# Patient Record
Sex: Female | Born: 1940 | Race: Black or African American | Hispanic: No | Marital: Single | Smoking: Never smoker
Health system: Southern US, Community
[De-identification: ages and names within clinical notes are randomized; demographics above are authoritative.]

## PROBLEM LIST (undated history)

## (undated) DIAGNOSIS — I509 Heart failure, unspecified: Secondary | ICD-10-CM

## (undated) DIAGNOSIS — M1990 Unspecified osteoarthritis, unspecified site: Secondary | ICD-10-CM

## (undated) DIAGNOSIS — N289 Disorder of kidney and ureter, unspecified: Secondary | ICD-10-CM

## (undated) DIAGNOSIS — I10 Essential (primary) hypertension: Secondary | ICD-10-CM

## (undated) DIAGNOSIS — E119 Type 2 diabetes mellitus without complications: Secondary | ICD-10-CM

## (undated) HISTORY — PX: TUBAL LIGATION: SHX77

## (undated) HISTORY — PX: PACEMAKER PLACEMENT: SHX43

## (undated) HISTORY — PX: BACK SURGERY: SHX140

---

## 2019-01-02 ENCOUNTER — Inpatient Hospital Stay
Admission: EM | Admit: 2019-01-02 | Discharge: 2019-01-05 | DRG: 291 | Disposition: A | Discharge status: Home / Self Care | Payer: Medicare Other | Attending: Family Medicine | Admitting: Family Medicine

## 2019-01-02 ENCOUNTER — Emergency Department: Payer: Medicare Other

## 2019-01-02 ENCOUNTER — Other Ambulatory Visit: Payer: Self-pay

## 2019-01-02 ENCOUNTER — Encounter: Payer: Self-pay | Admitting: Emergency Medicine

## 2019-01-02 DIAGNOSIS — N189 Chronic kidney disease, unspecified: Secondary | ICD-10-CM

## 2019-01-02 DIAGNOSIS — I082 Rheumatic disorders of both aortic and tricuspid valves: Secondary | ICD-10-CM | POA: Diagnosis present

## 2019-01-02 DIAGNOSIS — D649 Anemia, unspecified: Secondary | ICD-10-CM | POA: Diagnosis not present

## 2019-01-02 DIAGNOSIS — I5033 Acute on chronic diastolic (congestive) heart failure: Secondary | ICD-10-CM | POA: Diagnosis not present

## 2019-01-02 DIAGNOSIS — I5043 Acute on chronic combined systolic (congestive) and diastolic (congestive) heart failure: Secondary | ICD-10-CM | POA: Diagnosis present

## 2019-01-02 DIAGNOSIS — Z7982 Long term (current) use of aspirin: Secondary | ICD-10-CM | POA: Diagnosis not present

## 2019-01-02 DIAGNOSIS — I509 Heart failure, unspecified: Secondary | ICD-10-CM | POA: Diagnosis not present

## 2019-01-02 DIAGNOSIS — Z794 Long term (current) use of insulin: Secondary | ICD-10-CM | POA: Diagnosis not present

## 2019-01-02 DIAGNOSIS — Z950 Presence of cardiac pacemaker: Secondary | ICD-10-CM | POA: Diagnosis not present

## 2019-01-02 DIAGNOSIS — Z79899 Other long term (current) drug therapy: Secondary | ICD-10-CM

## 2019-01-02 DIAGNOSIS — N281 Cyst of kidney, acquired: Secondary | ICD-10-CM | POA: Diagnosis present

## 2019-01-02 DIAGNOSIS — I130 Hypertensive heart and chronic kidney disease with heart failure and stage 1 through stage 4 chronic kidney disease, or unspecified chronic kidney disease: Principal | ICD-10-CM | POA: Diagnosis present

## 2019-01-02 DIAGNOSIS — I361 Nonrheumatic tricuspid (valve) insufficiency: Secondary | ICD-10-CM | POA: Diagnosis not present

## 2019-01-02 DIAGNOSIS — E8770 Fluid overload, unspecified: Secondary | ICD-10-CM | POA: Diagnosis not present

## 2019-01-02 DIAGNOSIS — Z20828 Contact with and (suspected) exposure to other viral communicable diseases: Secondary | ICD-10-CM | POA: Diagnosis present

## 2019-01-02 DIAGNOSIS — I10 Essential (primary) hypertension: Secondary | ICD-10-CM

## 2019-01-02 DIAGNOSIS — D631 Anemia in chronic kidney disease: Secondary | ICD-10-CM | POA: Diagnosis present

## 2019-01-02 DIAGNOSIS — M1990 Unspecified osteoarthritis, unspecified site: Secondary | ICD-10-CM | POA: Diagnosis present

## 2019-01-02 DIAGNOSIS — N184 Chronic kidney disease, stage 4 (severe): Secondary | ICD-10-CM | POA: Diagnosis present

## 2019-01-02 DIAGNOSIS — N19 Unspecified kidney failure: Secondary | ICD-10-CM

## 2019-01-02 DIAGNOSIS — E1122 Type 2 diabetes mellitus with diabetic chronic kidney disease: Secondary | ICD-10-CM

## 2019-01-02 DIAGNOSIS — I351 Nonrheumatic aortic (valve) insufficiency: Secondary | ICD-10-CM | POA: Diagnosis not present

## 2019-01-02 HISTORY — DX: Unspecified osteoarthritis, unspecified site: M19.90

## 2019-01-02 HISTORY — DX: Essential (primary) hypertension: I10

## 2019-01-02 HISTORY — DX: Disorder of kidney and ureter, unspecified: N28.9

## 2019-01-02 HISTORY — DX: Type 2 diabetes mellitus without complications: E11.9

## 2019-01-02 HISTORY — DX: Heart failure, unspecified: I50.9

## 2019-01-02 LAB — BASIC METABOLIC PANEL
Anion gap: 11 (ref 5–15)
BUN: 48 mg/dL — ABNORMAL HIGH (ref 8–23)
CO2: 21 mmol/L — ABNORMAL LOW (ref 22–32)
Calcium: 8.9 mg/dL (ref 8.9–10.3)
Chloride: 110 mmol/L (ref 98–111)
Creatinine, Ser: 2.68 mg/dL — ABNORMAL HIGH (ref 0.44–1.00)
GFR calc Af Amer: 19 mL/min — ABNORMAL LOW (ref >60)
GFR calc non Af Amer: 16 mL/min — ABNORMAL LOW (ref >60)
Glucose, Bld: 151 mg/dL — ABNORMAL HIGH (ref 70–99)
Potassium: 4.8 mmol/L (ref 3.5–5.1)
Sodium: 142 mmol/L (ref 135–145)

## 2019-01-02 LAB — URINALYSIS, COMPLETE (UACMP) WITH MICROSCOPIC
Bilirubin Urine: NEGATIVE
Glucose, UA: NEGATIVE mg/dL
Hgb urine dipstick: NEGATIVE
Ketones, ur: NEGATIVE mg/dL
Leukocytes,Ua: NEGATIVE
Nitrite: NEGATIVE
Protein, ur: NEGATIVE mg/dL
Specific Gravity, Urine: 1.011 (ref 1.005–1.030)
pH: 5 (ref 5.0–8.0)

## 2019-01-02 LAB — POC SARS CORONAVIRUS 2 AG: SARS Coronavirus 2 Ag: NEGATIVE

## 2019-01-02 LAB — CBC
HCT: 35.8 % — ABNORMAL LOW (ref 36.0–46.0)
Hemoglobin: 11.2 g/dL — ABNORMAL LOW (ref 12.0–15.0)
MCH: 29.4 pg (ref 26.0–34.0)
MCHC: 31.3 g/dL (ref 30.0–36.0)
MCV: 94 fL (ref 80.0–100.0)
Platelets: 235 10*3/uL (ref 150–400)
RBC: 3.81 MIL/uL — ABNORMAL LOW (ref 3.87–5.11)
RDW: 17.1 % — ABNORMAL HIGH (ref 11.5–15.5)
WBC: 5.7 10*3/uL (ref 4.0–10.5)
nRBC: 0 % (ref 0.0–0.2)

## 2019-01-02 LAB — BRAIN NATRIURETIC PEPTIDE: B Natriuretic Peptide: 1220 pg/mL — ABNORMAL HIGH (ref 0.0–100.0)

## 2019-01-02 MED ORDER — FUROSEMIDE 10 MG/ML IJ SOLN
80.0000 mg | Freq: Once | INTRAMUSCULAR | Status: AC
  Administered 2019-01-02: 80 mg via INTRAVENOUS
  Filled 2019-01-02: qty 8

## 2019-01-02 MED ORDER — SODIUM CHLORIDE 0.9% FLUSH
3.0000 mL | Freq: Two times a day (BID) | INTRAVENOUS | Status: DC
  Administered 2019-01-02 – 2019-01-05 (×6): 3 mL via INTRAVENOUS

## 2019-01-02 MED ORDER — ONDANSETRON HCL 4 MG/2ML IJ SOLN: 4.0000 mg | Freq: Four times a day (QID) | INTRAMUSCULAR | Status: DC | PRN

## 2019-01-02 MED ORDER — FUROSEMIDE 10 MG/ML IJ SOLN
40.0000 mg | Freq: Two times a day (BID) | INTRAMUSCULAR | Status: DC
  Administered 2019-01-03 – 2019-01-05 (×5): 40 mg via INTRAVENOUS
  Filled 2019-01-02 (×5): qty 4

## 2019-01-02 MED ORDER — HEPARIN SODIUM (PORCINE) 5000 UNIT/ML IJ SOLN
5000.0000 [IU] | Freq: Three times a day (TID) | INTRAMUSCULAR | Status: DC
  Administered 2019-01-02 – 2019-01-05 (×10): 5000 [IU] via SUBCUTANEOUS
  Filled 2019-01-02 (×10): qty 1

## 2019-01-02 MED ORDER — SODIUM CHLORIDE 0.9% FLUSH: 3.0000 mL | INTRAVENOUS | Status: DC | PRN

## 2019-01-02 MED ORDER — SODIUM CHLORIDE 0.9 % IV SOLN: 250.0000 mL | INTRAVENOUS | Status: DC | PRN

## 2019-01-02 MED ORDER — ACETAMINOPHEN 325 MG PO TABS: 650.0000 mg | ORAL_TABLET | ORAL | Status: DC | PRN

## 2019-01-02 NOTE — ED Notes (Signed)
Marianela Mirenda (patient daughter):  [phone]

## 2019-01-02 NOTE — ED Notes (Addendum)
This RN attempted to provide to room 238 w/o success. Will atempt again.

## 2019-01-02 NOTE — ED Triage Notes (Addendum)
Pt arrived via POV with reports of leg swelling for 2-3 weeks. Pt states she is visiting from [place] and son wanted her to get her checked out.  Pt denies any shortness of breath, but states she feels swollen all over.  Pitting edema noted on exam, pt reports hx of HF.

## 2019-01-02 NOTE — H&P (Addendum)
History and Physical    Michele Beck [id number] DOB: 09/29/1940 DOA: 01/02/2019  PCP: No primary care provider on file.  Patient coming from: home, visiting son from [place]   Chief Complaint: "chf", LE edema  HPI: Michele Beck is a 78 y.o. female with medical history significant of diabetes, chf, htn, CKD (baseline unknown) was brought in by son for evaluation of her lower extremity edema.  Patient reports she is in little bit of "CHF ".  She does not weigh herself daily to know if her weight has gone up.  She does report she has some shortness of breath with exertion but is not too bad.  Also reports her lower extremity edema is worse than baseline.  Denies chest pain.  Unable to tell me if she has orthopnea as she does not lay flat in bed as instructed per her cardiologist per patient.  Denies any fever, chills, dizziness, or any other symptoms.  She reports having kidney disease and she reports following a urologist but unable to tell when she follows a nephrologist.  She eats some salty food /canned food at times but tries to watch her intake. Report compliance with meds  ED Course: In the ED Covid testing is not collected.  And pending.  Her BNP was 1220 and found with creatinine of 2.68.  This x-ray was low volumes with cardiomegaly and bibasilar patchy atelectasis or infiltrate.  Review of Systems: All systems reviewed and otherwise negative.    Past Medical History:  Diagnosis Date  . Arthritis   . CHF (congestive heart failure) ([place])   . Diabetes mellitus without complication ([place])   . Hypertension   . Renal disorder     Past Surgical History:  Procedure Laterality Date  . BACK SURGERY    . PACEMAKER PLACEMENT    . TUBAL LIGATION       reports that she has never smoked. She has never used smokeless tobacco. No history on file for alcohol and drug.  No Known Allergies  History reviewed. No pertinent family history.   Prior to Admission medications   Medication  Sig Start Date End Date Taking? Authorizing Provider  allopurinol (ZYLOPRIM) 100 MG tablet Take 100 mg by mouth daily. 12/07/18   [provider]  amLODipine (NORVASC) 5 MG tablet Take 5 mg by mouth daily. 11/02/18   [provider]  atorvastatin (LIPITOR) 20 MG tablet Take 20 mg by mouth daily. 12/27/18   [provider]  ferrous sulfate 325 (65 FE) MG tablet Take 325 mg by mouth 2 (two) times daily. 10/04/18   [provider]  furosemide (LASIX) 40 MG tablet Take 40 mg by mouth daily. 12/20/18   [provider]  hydrALAZINE (APRESOLINE) 100 MG tablet Take 100 mg by mouth 3 (three) times daily. 10/04/18   [provider]  LEVEMIR FLEXTOUCH 100 UNIT/ML Pen SMARTSIG:6 Unit(s) SUB-Q Every Night 07/20/18   [provider]  lisinopril (ZESTRIL) 40 MG tablet Take 40 mg by mouth 2 (two) times daily. 12/03/18   [provider]  metoprolol tartrate (LOPRESSOR) 100 MG tablet Take 100 mg by mouth 2 (two) times daily. 12/27/18   [provider]    Physical Exam: Vitals:   01/02/19 1136  BP: (!) 129/43  Pulse: 71  Resp: 18  Temp: 98.3 F (36.8 C)  TempSrc: Oral  SpO2: 97%  Weight: 90.7 kg  Height: 5\' 6"  (1.676 m)    Constitutional: NAD, calm, comfortable Vitals:   01/02/19 1136  BP: (!) 129/43  Pulse: 71  Resp: 18  Temp: 98.3 F (36.8 C)  TempSrc: Oral  SpO2: 97%  Weight: 90.7 kg  Height: 5\' 6"  (1.676 m)   Eyes: PERRL, lids and conjunctivae normal ENMT: mask on  Neck: normal, supple Respiratory:minimal scattered crackles R>L, no wheezing or rhonchi Cardiovascular: Regular rate and rhythm, no murmurs / rubs / gallops.  Abdomen: +fluid on palpation. NT. Mildly distended. +bs, no rebound  Musculoskeletal: no clubbing / cyanosis.  LE : 2+edema b/l with chronic skin changes. Skin: Dry warm Neurologic: CN 2-12 grossly intact.  Awake and alert x3 good strength x4 psychiatric: Normal judgment and insight. Alert  and oriented x 3. Normal mood.    Labs on Admission: I have personally reviewed following labs and imaging studies  CBC: Recent Labs  Lab 01/02/19 1208  WBC 5.7  HGB 11.2*  HCT 35.8*  MCV 94.0  PLT [id number]   Basic Metabolic Panel: Recent Labs  Lab 01/02/19 1208  NA 142  K 4.8  CL 110  CO2 21*  GLUCOSE 151*  BUN 48*  CREATININE 2.68*  CALCIUM 8.9   GFR: Estimated Creatinine Clearance: 19.6 mL/min (A) (by C-G formula based on SCr of 2.68 mg/dL (H)). Liver Function Tests: No results for input(s): AST, ALT, ALKPHOS, BILITOT, PROT, ALBUMIN in the last 168 hours. No results for input(s): LIPASE, AMYLASE in the last 168 hours. No results for input(s): AMMONIA in the last 168 hours. Coagulation Profile: No results for input(s): INR, PROTIME in the last 168 hours. Cardiac Enzymes: No results for input(s): CKTOTAL, CKMB, CKMBINDEX, TROPONINI in the last 168 hours. BNP (last 3 results) No results for input(s): PROBNP in the last 8760 hours. HbA1C: No results for input(s): HGBA1C in the last 72 hours. CBG: No results for input(s): GLUCAP in the last 168 hours. Lipid Profile: No results for input(s): CHOL, HDL, LDLCALC, TRIG, CHOLHDL, LDLDIRECT in the last 72 hours. Thyroid Function Tests: No results for input(s): TSH, T4TOTAL, FREET4, T3FREE, THYROIDAB in the last 72 hours. Anemia Panel: No results for input(s): VITAMINB12, FOLATE, FERRITIN, TIBC, IRON, RETICCTPCT in the last 72 hours. Urine analysis: No results found for: COLORURINE, APPEARANCEUR, LABSPEC, [place], GLUCOSEU, [place], BILIRUBINUR, KETONESUR, PROTEINUR, UROBILINOGEN, NITRITE, LEUKOCYTESUR  Radiological Exams on Admission: DG Chest 2 View  Result Date: 01/02/2019 CLINICAL DATA:  2-3 week history of shortness of breath and dry cough. EXAM: CHEST - 2 VIEW COMPARISON:  None. FINDINGS: 1233 hours. The cardio pericardial silhouette is enlarged. There is pulmonary vascular congestion without overt pulmonary edema.  Patchy airspace disease noted at the bases compatible with atelectasis or infiltrate. No substantial pleural effusion. Single lead pacer/AICD noted. The visualized bony structures of the thorax are intact. IMPRESSION: Low volumes with cardiomegaly and bibasilar patchy atelectasis or infiltrate. Electronically Signed   By: Misty Stanley M.D.   On: 01/02/2019 13:05    EKG: Independently reviewed. NSR, no ischemic st/t changes  Assessment/Plan Active Problems:   Acute CHF (congestive heart failure) ([place])    1. Acute chf/volume overload-systolic v.s. diastolic.Also with CKD may be retaining . BNP elevated Given Lasix 80 IV x1 in the ED Will start Lasix 40 mg IV twice daily Strict I's and O's Daily weight Echo to evaluate for systolic and diastolic function  2.CKD- unsure of baseline. Not sure if has acute playing a part Ck renal US Monitor diuretics Avoid nephrotoxic medications and NSAIDs Check urinalysis We will hold off on nephrology consult for now  3. Anemia- possibly chronic  from CKD. Baseline unknown No reports of bleed Monitor H&H for now  4. DM-ck HA1c Ck fs RISS Need med rec reconciliation to be done by phamacy, already discussed with them     DVT prophylaxis: Heparin subcu Code Status: Full Family Communication: Discussed assessment and plan with son via phone Disposition Plan: Likely will be here 2 midnight days until medically more stable and euvolemic.WILL PLACE ON AIRBORN PRECAUTION UNTIL COVID TEST RETURNS Consults called: None Admission status: Inpatient as he requires 2 midnight stays.    Nolberto Hanlon MD [facility] Pager [phone]   If 7PM-7AM, please contact night-coverage [email or web address] Password [facility]  01/02/2019, 4:51 PM

## 2019-01-02 NOTE — ED Provider Notes (Signed)
[facility] Emergency Department Provider Note ____________________________________________  Time seen: 1515  I have reviewed the triage vital signs and the nursing notes.  HISTORY  Chief Complaint  Leg Swelling   HPI Michele Beck is a 78 y.o. female presents to the ED from home, for evaluation of  a 2 to 3-week complaint of leg swelling.  Patient with a history of CHF as well as renal insufficiency, presents to the ED while on vacation visiting from [place].  Patient would describe that over the last 4 to 5 weeks she has had slowly increasing heaviness in the legs.  She denies any significant chest pain, shortness of breath, or fevers.  She feels like the swelling has come from her lower legs and is now coming into her thighs.  She was recently evaluated by her primary provider who had mentioned her kidney disease, and was in the process of increasing her diuretic.  Patient presents now for evaluation of bilateral leg swelling and heaviness.  Past Medical History:  Diagnosis Date  . Arthritis   . CHF (congestive heart failure) ([place])   . Diabetes mellitus without complication ([place])   . Hypertension   . Renal disorder     Patient Active Problem List   Diagnosis Date Noted  . Acute CHF (congestive heart failure) ([place]) 01/02/2019    Past Surgical History:  Procedure Laterality Date  . BACK SURGERY    . PACEMAKER PLACEMENT    . TUBAL LIGATION      Prior to Admission medications   Medication Sig Start Date End Date Taking? Authorizing Provider  allopurinol (ZYLOPRIM) 100 MG tablet Take 100 mg by mouth daily. 12/07/18  Yes [provider]  amLODipine (NORVASC) 5 MG tablet Take 5 mg by mouth daily. 11/02/18  Yes [provider]  aspirin 81 MG chewable tablet Chew 81 mg by mouth daily.   Yes [provider]  atorvastatin (LIPITOR) 20 MG tablet Take 20 mg by mouth daily. 12/27/18  Yes [provider]  ferrous sulfate 325  (65 FE) MG tablet Take 325 mg by mouth 2 (two) times daily. 10/04/18  Yes [provider]  furosemide (LASIX) 40 MG tablet Take 40 mg by mouth daily. 12/20/18  Yes [provider]  hydrALAZINE (APRESOLINE) 100 MG tablet Take 100 mg by mouth 3 (three) times daily. 10/04/18  Yes [provider]  LEVEMIR FLEXTOUCH 100 UNIT/ML Pen SMARTSIG:6 Unit(s) SUB-Q Every Night 07/20/18  Yes [provider]  lisinopril (ZESTRIL) 40 MG tablet Take 40 mg by mouth 2 (two) times daily. 12/03/18  Yes [provider]  metoprolol tartrate (LOPRESSOR) 100 MG tablet Take 100 mg by mouth 2 (two) times daily. 12/27/18  Yes [provider]    Allergies Patient has no known allergies.  History reviewed. No pertinent family history.  Social History Social History   Tobacco Use  . Smoking status: Never Smoker  . Smokeless tobacco: Never Used  Substance Use Topics  . Alcohol use: Not on file  . Drug use: Not on file    Review of Systems  Constitutional: Negative for fever. Eyes: Negative for visual changes. ENT: Negative for sore throat. Cardiovascular: Negative for chest pain.  Reports bilateral edema as above. Respiratory: Negative for shortness of breath. Gastrointestinal: Negative for abdominal pain, vomiting and diarrhea. Genitourinary: Negative for dysuria. Musculoskeletal: Negative for back pain. Skin: Negative for rash. Neurological: Negative for headaches, focal weakness or numbness. ____________________________________________  PHYSICAL EXAM:  VITAL SIGNS: ED Triage  Vitals [01/02/19 1136]  Enc Vitals Group     BP (!) 129/43     Pulse Rate 71     Resp 18     Temp 98.3 F (36.8 C)     Temp Source Oral     SpO2 97 %     Weight 200 lb (90.7 kg)     Height 5\' 6"  (1.676 m)     Head Circumference      Peak Flow      Pain Score 0     Pain Loc      Pain Edu?      Excl. in [place]?     Constitutional: Alert and oriented. Well appearing and in  no distress. Head: Normocephalic and atraumatic. Eyes: Conjunctivae are normal. Normal extraocular movements Cardiovascular: Normal rate, regular rhythm. Normal distal pulses. Bilateral edema and 2+ pitting noted.  Respiratory: Normal respiratory effort. No wheezes/rhonchi. Mild rales noted bilaterally Gastrointestinal: Soft and nontender. No distention. Musculoskeletal: Nontender with normal range of motion in all extremities.  Neurologic:  Normal gait without ataxia. Normal speech and language. No gross focal neurologic deficits are appreciated. Skin:  Skin is warm, dry and intact. No rash noted. BLE edema as above. No skin lesions, weeping, or ecchymosis  ____________________________________________   LABS (pertinent positives/negatives)  Labs Reviewed  BASIC METABOLIC PANEL - Abnormal; Notable for the following components:      Result Value   CO2 21 (*)    Glucose, Bld 151 (*)    BUN 48 (*)    Creatinine, Ser 2.68 (*)    GFR calc non Af Amer 16 (*)    GFR calc Af Amer 19 (*)    All other components within normal limits  CBC - Abnormal; Notable for the following components:   RBC 3.81 (*)    Hemoglobin 11.2 (*)    HCT 35.8 (*)    RDW 17.1 (*)    All other components within normal limits  BRAIN NATRIURETIC PEPTIDE - Abnormal; Notable for the following components:   B Natriuretic Peptide 1,220.0 (*)    All other components within normal limits  URINALYSIS, COMPLETE (UACMP) WITH MICROSCOPIC - Abnormal; Notable for the following components:   Color, Urine STRAW (*)    APPearance CLEAR (*)    Bacteria, UA MANY (*)    All other components within normal limits  CBC  CREATININE, SERUM  BASIC METABOLIC PANEL  POC SARS CORONAVIRUS 2 AG -  ED  ____________________________________________  EKG  NSR 69 bpm PR Interval 154 ms QRS duration 86 ms Normal axis No STEMI ____________________________________________   RADIOLOGY  CXR IMPRESSION: Low volumes with cardiomegaly and  bibasilar patchy atelectasis or Infiltrate.  Doppler US BLE IMPRESSION: No evidence of deep venous thrombosis in either lower extremity.  Bilateral lower extremity subcutaneous edema.  Bilateral groin adenopathy.  Renal US IMPRESSION: Cortical thinning and medical renal disease changes of both kidneys.  Small LEFT renal cysts.  No additional renal masses or hydronephrosis. ____________________________________________  PROCEDURES  Lasix 80 mg IVP Procedures ____________________________________________  INITIAL IMPRESSION / ASSESSMENT AND PLAN / ED COURSE  Patient with ED evaluation of 4-5 weeks of increasing LE edema. She presents to the ED from [place], while visiting her son. She is found to be in a acute CHF exacerbation with BNP elevated at 1220 and chronic kidney disease with BUN/Cr 48/2.68. She endorses history of CKD, but we have no prior records to compare to baseline. I have recommended and discussed admission for  diuresis, and patient is agreeable. I will discuss admission with hospitalist.   ----------------------------------------- 4:03 PM on 01/02/2019 ----------------------------------------- Page to hospitalist.  Spoke with Dr. Kurtis Bushman: She request ultrasound studies of the lower extremities related to possible DVT rule out due to patient travel status.  Those results along with the rapid Covid test are pending at this time.  Patient will be admitted to the hospital service for further management.  Jeanita Brignola was evaluated in Emergency Department on 01/02/2019 for the symptoms described in the history of present illness. She was evaluated in the context of the global COVID-19 pandemic, which necessitated consideration that the patient might be at risk for infection with the SARS-CoV-2 virus that causes COVID-19. Institutional protocols and algorithms that pertain to the evaluation of patients at risk for COVID-19 are in a state of rapid change based on  information released by regulatory bodies including the CDC and federal and state organizations. These policies and algorithms were followed during the patient's care in the ED. ____________________________________________  FINAL CLINICAL IMPRESSION(S) / ED DIAGNOSES  Final diagnoses:  Acute on chronic congestive heart failure, unspecified heart failure type ([place])  Chronic renal impairment, unspecified CKD stage      Melvenia Needles, PA-C 01/02/19 1931    Nance Pear, MD 01/02/19 2011

## 2019-01-02 NOTE — Plan of Care (Signed)
  Problem: Education: Goal: Knowledge of General Education information will improve Description: Including pain rating scale, medication(s)/side effects and non-pharmacologic comfort measures Outcome: Progressing   Problem: Clinical Measurements: Goal: Cardiovascular complication will be avoided Outcome: Progressing   Problem: Safety: Goal: Ability to remain free from injury will improve Outcome: Progressing   Problem: Skin Integrity: Goal: Risk for impaired skin integrity will decrease Outcome: Progressing   Problem: Education: Goal: Ability to demonstrate management of disease process will improve Outcome: Progressing

## 2019-01-02 NOTE — ED Notes (Signed)
Pure[place] in place at this time.

## 2019-01-02 NOTE — ED Notes (Signed)
Patient transported to Ultrasound 

## 2019-01-02 NOTE — ED Notes (Signed)
Pt back from US

## 2019-01-03 ENCOUNTER — Inpatient Hospital Stay (HOSPITAL_COMMUNITY)
Admit: 2019-01-03 | Discharge: 2019-01-03 | Disposition: A | Discharge status: Home / Self Care | Payer: Medicare Other | Attending: Internal Medicine | Admitting: Internal Medicine

## 2019-01-03 DIAGNOSIS — E1122 Type 2 diabetes mellitus with diabetic chronic kidney disease: Secondary | ICD-10-CM

## 2019-01-03 DIAGNOSIS — N189 Chronic kidney disease, unspecified: Secondary | ICD-10-CM

## 2019-01-03 DIAGNOSIS — I351 Nonrheumatic aortic (valve) insufficiency: Secondary | ICD-10-CM

## 2019-01-03 DIAGNOSIS — I361 Nonrheumatic tricuspid (valve) insufficiency: Secondary | ICD-10-CM

## 2019-01-03 LAB — BASIC METABOLIC PANEL
Anion gap: 10 (ref 5–15)
BUN: 42 mg/dL — ABNORMAL HIGH (ref 8–23)
CO2: 26 mmol/L (ref 22–32)
Calcium: 9.1 mg/dL (ref 8.9–10.3)
Chloride: 108 mmol/L (ref 98–111)
Creatinine, Ser: 2.21 mg/dL — ABNORMAL HIGH (ref 0.44–1.00)
GFR calc Af Amer: 24 mL/min — ABNORMAL LOW (ref >60)
GFR calc non Af Amer: 21 mL/min — ABNORMAL LOW (ref >60)
Glucose, Bld: 134 mg/dL — ABNORMAL HIGH (ref 70–99)
Potassium: 4.7 mmol/L (ref 3.5–5.1)
Sodium: 144 mmol/L (ref 135–145)

## 2019-01-03 LAB — SARS CORONAVIRUS 2 (TAT 6-24 HRS): SARS Coronavirus 2: NEGATIVE

## 2019-01-03 LAB — GLUCOSE, CAPILLARY
Glucose-Capillary: 137 mg/dL — ABNORMAL HIGH (ref 70–99)
Glucose-Capillary: 115 mg/dL — ABNORMAL HIGH (ref 70–99)
Glucose-Capillary: 163 mg/dL — ABNORMAL HIGH (ref 70–99)
Glucose-Capillary: 150 mg/dL — ABNORMAL HIGH (ref 70–99)

## 2019-01-03 LAB — HEMOGLOBIN A1C
Hgb A1c MFr Bld: 6.4 % — ABNORMAL HIGH (ref 4.8–5.6)
Mean Plasma Glucose: 136.98 mg/dL

## 2019-01-03 LAB — ECHOCARDIOGRAM COMPLETE
Height: 66 in
Weight: 3790.4 oz

## 2019-01-03 MED ORDER — INSULIN ASPART 100 UNIT/ML ~~LOC~~ SOLN
0.0000 [IU] | Freq: Three times a day (TID) | SUBCUTANEOUS | Status: DC
  Administered 2019-01-03 – 2019-01-05 (×5): 2 [IU] via SUBCUTANEOUS
  Filled 2019-01-03 (×5): qty 1

## 2019-01-03 MED ORDER — TRAZODONE HCL 50 MG PO TABS
50.0000 mg | ORAL_TABLET | Freq: Every evening | ORAL | Status: DC | PRN
  Administered 2019-01-04: 50 mg via ORAL
  Filled 2019-01-03: qty 1

## 2019-01-03 MED ORDER — AMLODIPINE BESYLATE 5 MG PO TABS
5.0000 mg | ORAL_TABLET | Freq: Every day | ORAL | Status: DC
  Administered 2019-01-03 – 2019-01-05 (×3): 5 mg via ORAL
  Filled 2019-01-03 (×3): qty 1

## 2019-01-03 MED ORDER — HYDRALAZINE HCL 25 MG PO TABS
25.0000 mg | ORAL_TABLET | Freq: Three times a day (TID) | ORAL | Status: DC
  Administered 2019-01-03 – 2019-01-04 (×3): 25 mg via ORAL
  Filled 2019-01-03 (×3): qty 1

## 2019-01-03 MED ORDER — ATORVASTATIN CALCIUM 20 MG PO TABS
20.0000 mg | ORAL_TABLET | Freq: Every day | ORAL | Status: DC
  Administered 2019-01-03 – 2019-01-04 (×2): 20 mg via ORAL
  Filled 2019-01-03 (×2): qty 1

## 2019-01-03 NOTE — Progress Notes (Signed)
*  PRELIMINARY RESULTS* Echocardiogram 2D Echocardiogram has been performed.  Michele Beck 01/03/2019, 9:40 AM

## 2019-01-03 NOTE — Progress Notes (Signed)
PROGRESS NOTE    Michele Beck  [id number] DOB: 04-30-1940 DOA: 01/02/2019 PCP: System, Pcp Not In    Brief Narrative:  Michele Beck is a 78 y.o. female with medical history significant of diabetes, chf, htn, CKD (baseline unknown) was brought in by son for evaluation of her lower extremity edema. Started on chf treatment. covid pcr pending    Consultants:   none  Procedures: Echo  Antimicrobials:   None   Subjective: Feeling much better today.  Less short of breath.  No chest pain.  Less orthopneic  Objective: Vitals:   01/02/19 2203 01/02/19 2237 01/03/19 0440 01/03/19 0828  BP: (!) 172/65  (!) 148/67 (!) 147/56  Pulse: 73  72 72  Resp: 18  18 19   Temp: 97.7 F (36.5 C)  97.6 F (36.4 C) 97.7 F (36.5 C)  TempSrc: Oral  Oral Oral  SpO2: 94%  93% 94%  Weight:  107.6 kg 107.5 kg   Height:  5\' 6"  (1.676 m)      Intake/Output Summary (Last 24 hours) at 01/03/2019 1407 Last data filed at 01/03/2019 1219 Gross per 24 hour  Intake --  Output 3000 ml  Net -3000 ml   Filed Weights   01/02/19 1136 01/02/19 2237 01/03/19 0440  Weight: 90.7 kg 107.6 kg 107.5 kg    Examination:  General exam: Appears calm and comfortable  Respiratory system: Minimal crackles at bases no wheeze rales rhonchi's Cardiovascular system: S1 & S2 heard, RRR.3/6 SEM LSB Gastrointestinal system: Abdomen is less distended, soft and nontender. Normal bowel sounds heard. Central nervous system: Alert and orientedx3. No focal neurological deficits. Extremities: +edema b/l improving Psychiatry: Judgement and insight appear normal. Mood & affect appropriate.     Data Reviewed: I have personally reviewed following labs and imaging studies  CBC: Recent Labs  Lab 01/02/19 1208  WBC 5.7  HGB 11.2*  HCT 35.8*  MCV 94.0  PLT [id number]   Basic Metabolic Panel: Recent Labs  Lab 01/02/19 1208 01/03/19 0554  NA 142 144  K 4.8 4.7  CL 110 108  CO2 21* 26  GLUCOSE 151* 134*  BUN  48* 42*  CREATININE 2.68* 2.21*  CALCIUM 8.9 9.1   GFR: Estimated Creatinine Clearance: 26 mL/min (A) (by C-G formula based on SCr of 2.21 mg/dL (H)). Liver Function Tests: No results for input(s): AST, ALT, ALKPHOS, BILITOT, PROT, ALBUMIN in the last 168 hours. No results for input(s): LIPASE, AMYLASE in the last 168 hours. No results for input(s): AMMONIA in the last 168 hours. Coagulation Profile: No results for input(s): INR, PROTIME in the last 168 hours. Cardiac Enzymes: No results for input(s): CKTOTAL, CKMB, CKMBINDEX, TROPONINI in the last 168 hours. BNP (last 3 results) No results for input(s): PROBNP in the last 8760 hours. HbA1C: No results for input(s): HGBA1C in the last 72 hours. CBG: Recent Labs  Lab 01/03/19 1145  GLUCAP 115*   Lipid Profile: No results for input(s): CHOL, HDL, LDLCALC, TRIG, CHOLHDL, LDLDIRECT in the last 72 hours. Thyroid Function Tests: No results for input(s): TSH, T4TOTAL, FREET4, T3FREE, THYROIDAB in the last 72 hours. Anemia Panel: No results for input(s): VITAMINB12, FOLATE, FERRITIN, TIBC, IRON, RETICCTPCT in the last 72 hours. Sepsis Labs: No results for input(s): PROCALCITON, LATICACIDVEN in the last 168 hours.  No results found for this or any previous visit (from the past 240 hour(s)).       Radiology Studies: DG Chest 2 View  Result Date: 01/02/2019 CLINICAL DATA:  2-3 week  history of shortness of breath and dry cough. EXAM: CHEST - 2 VIEW COMPARISON:  None. FINDINGS: 1233 hours. The cardio pericardial silhouette is enlarged. There is pulmonary vascular congestion without overt pulmonary edema. Patchy airspace disease noted at the bases compatible with atelectasis or infiltrate. No substantial pleural effusion. Single lead pacer/AICD noted. The visualized bony structures of the thorax are intact. IMPRESSION: Low volumes with cardiomegaly and bibasilar patchy atelectasis or infiltrate. Electronically Signed   By: Misty Stanley  M.D.   On: 01/02/2019 13:05   US RENAL  Result Date: 01/02/2019 CLINICAL DATA:  Renal failure, history hypertension, diabetes mellitus, CHF EXAM: RENAL / URINARY TRACT ULTRASOUND COMPLETE COMPARISON:  None FINDINGS: Right Kidney: Renal measurements: 8.1 x 4.4 x 4.2 cm = volume: 81 mL. Cortical thinning. Increased cortical echogenicity. No mass, hydronephrosis or shadowing calcification. Left Kidney: Renal measurements: 8.4 x 4.8 x 4.6 cm = volume: 99 mL. Cortical thinning. Increased cortical echogenicity. Small cyst at upper pole 1.3 x 1.3 x 1.6 cm. Additional cyst at inferior pole 2.6 x 1.7 x 2.2 cm. No additional mass, hydronephrosis, or shadowing calcification. Bladder: Appears normal for degree of bladder distention. Ureteral jets were not visualized. Other: N/A IMPRESSION: Cortical thinning and medical renal disease changes of both kidneys. Small LEFT renal cysts. No additional renal masses or hydronephrosis. Electronically Signed   By: Lavonia Dana M.D.   On: 01/02/2019 19:01   US Venous Img Lower Bilateral  Result Date: 01/02/2019 CLINICAL DATA:  Bilateral lower extremity edema and swelling EXAM: BILATERAL LOWER EXTREMITY VENOUS DOPPLER ULTRASOUND TECHNIQUE: Gray-scale sonography with graded compression, as well as color Doppler and duplex ultrasound were performed to evaluate the lower extremity deep venous systems from the level of the common femoral vein and including the common femoral, femoral, profunda femoral, popliteal and calf veins including the posterior tibial, peroneal and gastrocnemius veins when visible. The superficial great saphenous vein was also interrogated. Spectral Doppler was utilized to evaluate flow at rest and with distal augmentation maneuvers in the common femoral, femoral and popliteal veins. COMPARISON:  None. FINDINGS: RIGHT LOWER EXTREMITY Common Femoral Vein: No evidence of thrombus. Normal compressibility, respiratory phasicity and response to augmentation.  Saphenofemoral Junction: No evidence of thrombus. Normal compressibility and flow on color Doppler imaging. Profunda Femoral Vein: No evidence of thrombus. Normal compressibility and flow on color Doppler imaging. Femoral Vein: No evidence of thrombus. Normal compressibility, respiratory phasicity and response to augmentation. Popliteal Vein: No evidence of thrombus. Normal compressibility, respiratory phasicity and response to augmentation. Calf Veins: Somewhat limited visualization. Superficial Great Saphenous Vein: No evidence of thrombus. Normal compressibility. Venous Reflux:  None. Other Findings:  None. LEFT LOWER EXTREMITY Common Femoral Vein: No evidence of thrombus. Normal compressibility, respiratory phasicity and response to augmentation. Saphenofemoral Junction: No evidence of thrombus. Normal compressibility and flow on color Doppler imaging. Profunda Femoral Vein: No evidence of thrombus. Normal compressibility and flow on color Doppler imaging. Femoral Vein: No evidence of thrombus. Normal compressibility, respiratory phasicity and response to augmentation. Popliteal Vein: No evidence of thrombus. Normal compressibility, respiratory phasicity and response to augmentation. Calf Veins: Somewhat limited visualization. Superficial Great Saphenous Vein: No evidence of thrombus. Normal compressibility. Venous Reflux:  None. Other Findings: Somewhat limited visualization of the calf vessels due to overlying subcutaneous edema. Bilateral inguinal adenopathy is seen. IMPRESSION: No evidence of deep venous thrombosis in either lower extremity. Bilateral lower extremity subcutaneous edema. Bilateral groin adenopathy. Electronically Signed   By: Prudencio Pair M.D.   On: 01/02/2019  19:09        Scheduled Meds: . furosemide  40 mg Intravenous BID  . heparin  5,000 Units Subcutaneous Q8H  . sodium chloride flush  3 mL Intravenous Q12H   Continuous Infusions: . sodium chloride      Assessment &  Plan:   Active Problems:   Acute CHF (congestive heart failure) ([place])  1. Acute chf/volume overload-systolic v.s. diastolic.Also with CKD may be retaining . BNP elevated Given Lasix 80 IV x1 in the ED Continue on Lasix 40 mg IV twice daily Strict I's and O's Daily weight Echo to evaluate for systolic and diastolic function-pending, will f/u  2.A/CKD- unsure of baseline.Improving with diuresis. renal US-Cortical thinning and medical renal disease changes of both kidneys., LT small renal cyst Avoid nephrotoxic medications and NSAIDs No proteinuria urinalysis We will hold off on nephrology consult for now  3. Anemia- possibly chronic from CKD. Baseline unknown No reports of bleed Monitor H&H for now  4. DM-ck HA1c Ck fs RISS Need med rec reconciliation to be done by phamacy, already discussed with them     DVT prophylaxis: Heparin subcu Code Status: Full Family Communication:  None at bedside Disposition Plan: Possible DC in 1 to 2 days when medically more stable from heart failure standpoint.   LOS: 1 day   Time spent: 5 minutes with more than 50% COC    Nolberto Hanlon, MD [facility] Pager [phone]  If 7PM-7AM, please contact night-coverage [email or web address] Password [email or web address] 01/03/2019, 2:07 PM

## 2019-01-03 NOTE — Progress Notes (Signed)
Unable to do pts. ReDs reading due to BMI.

## 2019-01-04 DIAGNOSIS — I10 Essential (primary) hypertension: Secondary | ICD-10-CM

## 2019-01-04 LAB — GLUCOSE, CAPILLARY
Glucose-Capillary: 197 mg/dL — ABNORMAL HIGH (ref 70–99)
Glucose-Capillary: 175 mg/dL — ABNORMAL HIGH (ref 70–99)
Glucose-Capillary: 157 mg/dL — ABNORMAL HIGH (ref 70–99)
Glucose-Capillary: 204 mg/dL — ABNORMAL HIGH (ref 70–99)

## 2019-01-04 LAB — BASIC METABOLIC PANEL
Anion gap: 14 (ref 5–15)
BUN: 38 mg/dL — ABNORMAL HIGH (ref 8–23)
CO2: 26 mmol/L (ref 22–32)
Calcium: 9.5 mg/dL (ref 8.9–10.3)
Chloride: 104 mmol/L (ref 98–111)
Creatinine, Ser: 2.13 mg/dL — ABNORMAL HIGH (ref 0.44–1.00)
GFR calc Af Amer: 25 mL/min — ABNORMAL LOW (ref >60)
GFR calc non Af Amer: 22 mL/min — ABNORMAL LOW (ref >60)
Glucose, Bld: 231 mg/dL — ABNORMAL HIGH (ref 70–99)
Potassium: 4.4 mmol/L (ref 3.5–5.1)
Sodium: 144 mmol/L (ref 135–145)

## 2019-01-04 LAB — BRAIN NATRIURETIC PEPTIDE: B Natriuretic Peptide: 1196 pg/mL — ABNORMAL HIGH (ref 0.0–100.0)

## 2019-01-04 MED ORDER — HYDRALAZINE HCL 50 MG PO TABS
100.0000 mg | ORAL_TABLET | Freq: Three times a day (TID) | ORAL | Status: DC
  Administered 2019-01-04 – 2019-01-05 (×5): 100 mg via ORAL
  Filled 2019-01-04 (×5): qty 2

## 2019-01-04 MED ORDER — METOPROLOL TARTRATE 50 MG PO TABS
50.0000 mg | ORAL_TABLET | Freq: Two times a day (BID) | ORAL | Status: DC
  Administered 2019-01-04 – 2019-01-05 (×2): 50 mg via ORAL
  Filled 2019-01-04 (×2): qty 1

## 2019-01-04 NOTE — Progress Notes (Signed)
PROGRESS NOTE    Michele Beck  [id number] DOB: Jun 07, 1940 DOA: 01/02/2019 PCP: System, Pcp Not In    Brief Narrative:  Michele Beck is a 78 y.o. female with medical history significant of diabetes, chf, htn, CKD (baseline unknown) was brought in by son for evaluation of her lower extremity edema. Started on chf treatment. covid pcr negative   Consultants:   none  Procedures: Echo  Antimicrobials:   None   Subjective: Feeling much better today, still not quite at baseline.  Feels less swollen  Objective: Vitals:   01/04/19 0348 01/04/19 0751 01/04/19 1528 01/04/19 1528  BP: (!) 154/80 (!) 155/66 (!) 147/69 (!) 147/69  Pulse: 98 93  94  Resp: 20 20  18   Temp: (!) 97.5 F (36.4 C) 97.7 F (36.5 C)  (!) 97.4 F (36.3 C)  TempSrc: Oral Oral  Oral  SpO2: (!) 88% 90%  90%  Weight: 103 kg     Height:        Intake/Output Summary (Last 24 hours) at 01/04/2019 1827 Last data filed at 01/04/2019 1816 Gross per 24 hour  Intake 3 ml  Output 850 ml  Net -847 ml   Filed Weights   01/02/19 2237 01/03/19 0440 01/04/19 0348  Weight: 107.6 kg 107.5 kg 103 kg    Examination:  General exam: Appears calm and comfortable , NAD looks much better nontachypneic Respiratory system: Minimal crackles at bases no wheeze, rhonchi's Cardiovascular system: S1 & S2 heard, RRR.3/6 SEM LSB Gastrointestinal system: Abdomen is less distended, soft and nontender. Normal bowel sounds heard.  Less fluid shift Central nervous system: Alert and orientedx3. No focal neurological deficits. Extremities: +edema b/l improving, no cyanosis Psychiatry: Judgement and insight appear normal. Mood & affect appropriate.     Data Reviewed: I have personally reviewed following labs and imaging studies  CBC: Recent Labs  Lab 01/02/19 1208  WBC 5.7  HGB 11.2*  HCT 35.8*  MCV 94.0  PLT [id number]   Basic Metabolic Panel: Recent Labs  Lab 01/02/19 1208 01/03/19 0554 01/04/19 0511  NA 142  144 144  K 4.8 4.7 4.4  CL 110 108 104  CO2 21* 26 26  GLUCOSE 151* 134* 231*  BUN 48* 42* 38*  CREATININE 2.68* 2.21* 2.13*  CALCIUM 8.9 9.1 9.5   GFR: Estimated Creatinine Clearance: 26.4 mL/min (A) (by C-G formula based on SCr of 2.13 mg/dL (H)). Liver Function Tests: No results for input(s): AST, ALT, ALKPHOS, BILITOT, PROT, ALBUMIN in the last 168 hours. No results for input(s): LIPASE, AMYLASE in the last 168 hours. No results for input(s): AMMONIA in the last 168 hours. Coagulation Profile: No results for input(s): INR, PROTIME in the last 168 hours. Cardiac Enzymes: No results for input(s): CKTOTAL, CKMB, CKMBINDEX, TROPONINI in the last 168 hours. BNP (last 3 results) No results for input(s): PROBNP in the last 8760 hours. HbA1C: Recent Labs    01/03/19 1620  HGBA1C 6.4*   CBG: Recent Labs  Lab 01/03/19 1611 01/03/19 2049 01/04/19 0746 01/04/19 1158 01/04/19 1733  GLUCAP 163* 150* 197* 175* 157*   Lipid Profile: No results for input(s): CHOL, HDL, LDLCALC, TRIG, CHOLHDL, LDLDIRECT in the last 72 hours. Thyroid Function Tests: No results for input(s): TSH, T4TOTAL, FREET4, T3FREE, THYROIDAB in the last 72 hours. Anemia Panel: No results for input(s): VITAMINB12, FOLATE, FERRITIN, TIBC, IRON, RETICCTPCT in the last 72 hours. Sepsis Labs: No results for input(s): PROCALCITON, LATICACIDVEN in the last 168 hours.  Recent Results (from the  past 240 hour(s))  SARS CORONAVIRUS 2 (TAT 6-24 HRS) Nasopharyngeal Nasopharyngeal Swab     Status: None   Collection Time: 01/03/19  9:07 AM   Specimen: Nasopharyngeal Swab  Result Value Ref Range Status   SARS Coronavirus 2 NEGATIVE NEGATIVE Final    Comment: (NOTE) SARS-CoV-2 target nucleic acids are NOT DETECTED. The SARS-CoV-2 RNA is generally detectable in upper and lower respiratory specimens during the acute phase of infection. Negative results do not preclude SARS-CoV-2 infection, do not rule out co-infections  with other pathogens, and should not be used as the sole basis for treatment or other patient management decisions. Negative results must be combined with clinical observations, patient history, and epidemiological information. The expected result is Negative. Fact Sheet for Patients: SugarRoll.be Fact Sheet for Healthcare Providers: https://www.woods-mathews.com/ This test is not yet approved or cleared by the [place] FDA and  has been authorized for detection and/or diagnosis of SARS-CoV-2 by FDA under an Emergency Use Authorization (EUA). This EUA will remain  in effect (meaning this test can be used) for the duration of the COVID-19 declaration under Section 56 4(b)(1) of the Act, 21 U.S.C. section 360bbb-3(b)(1), unless the authorization is terminated or revoked sooner. Performed at [facility], [place] [address], [place],  [address]          Radiology Studies: US RENAL  Result Date: 01/02/2019 CLINICAL DATA:  Renal failure, history hypertension, diabetes mellitus, CHF EXAM: RENAL / URINARY TRACT ULTRASOUND COMPLETE COMPARISON:  None FINDINGS: Right Kidney: Renal measurements: 8.1 x 4.4 x 4.2 cm = volume: 81 mL. Cortical thinning. Increased cortical echogenicity. No mass, hydronephrosis or shadowing calcification. Left Kidney: Renal measurements: 8.4 x 4.8 x 4.6 cm = volume: 99 mL. Cortical thinning. Increased cortical echogenicity. Small cyst at upper pole 1.3 x 1.3 x 1.6 cm. Additional cyst at inferior pole 2.6 x 1.7 x 2.2 cm. No additional mass, hydronephrosis, or shadowing calcification. Bladder: Appears normal for degree of bladder distention. Ureteral jets were not visualized. Other: N/A IMPRESSION: Cortical thinning and medical renal disease changes of both kidneys. Small LEFT renal cysts. No additional renal masses or hydronephrosis. Electronically Signed   By: Lavonia Dana M.D.   On: 01/02/2019 19:01   US Venous Img  Lower Bilateral  Result Date: 01/02/2019 CLINICAL DATA:  Bilateral lower extremity edema and swelling EXAM: BILATERAL LOWER EXTREMITY VENOUS DOPPLER ULTRASOUND TECHNIQUE: Gray-scale sonography with graded compression, as well as color Doppler and duplex ultrasound were performed to evaluate the lower extremity deep venous systems from the level of the common femoral vein and including the common femoral, femoral, profunda femoral, popliteal and calf veins including the posterior tibial, peroneal and gastrocnemius veins when visible. The superficial great saphenous vein was also interrogated. Spectral Doppler was utilized to evaluate flow at rest and with distal augmentation maneuvers in the common femoral, femoral and popliteal veins. COMPARISON:  None. FINDINGS: RIGHT LOWER EXTREMITY Common Femoral Vein: No evidence of thrombus. Normal compressibility, respiratory phasicity and response to augmentation. Saphenofemoral Junction: No evidence of thrombus. Normal compressibility and flow on color Doppler imaging. Profunda Femoral Vein: No evidence of thrombus. Normal compressibility and flow on color Doppler imaging. Femoral Vein: No evidence of thrombus. Normal compressibility, respiratory phasicity and response to augmentation. Popliteal Vein: No evidence of thrombus. Normal compressibility, respiratory phasicity and response to augmentation. Calf Veins: Somewhat limited visualization. Superficial Great Saphenous Vein: No evidence of thrombus. Normal compressibility. Venous Reflux:  None. Other Findings:  None. LEFT LOWER EXTREMITY Common  Femoral Vein: No evidence of thrombus. Normal compressibility, respiratory phasicity and response to augmentation. Saphenofemoral Junction: No evidence of thrombus. Normal compressibility and flow on color Doppler imaging. Profunda Femoral Vein: No evidence of thrombus. Normal compressibility and flow on color Doppler imaging. Femoral Vein: No evidence of thrombus. Normal  compressibility, respiratory phasicity and response to augmentation. Popliteal Vein: No evidence of thrombus. Normal compressibility, respiratory phasicity and response to augmentation. Calf Veins: Somewhat limited visualization. Superficial Great Saphenous Vein: No evidence of thrombus. Normal compressibility. Venous Reflux:  None. Other Findings: Somewhat limited visualization of the calf vessels due to overlying subcutaneous edema. Bilateral inguinal adenopathy is seen. IMPRESSION: No evidence of deep venous thrombosis in either lower extremity. Bilateral lower extremity subcutaneous edema. Bilateral groin adenopathy. Electronically Signed   By: Prudencio Pair M.D.   On: 01/02/2019 19:09   ECHOCARDIOGRAM COMPLETE  Result Date: 01/03/2019   ECHOCARDIOGRAM REPORT   Patient Name:   LETEISHA PULLER Date of Exam: 01/03/2019 Medical Rec #:  [id number]       Height:       66.0 in Accession #:    [id number]      Weight:       236.9 lb Date of Birth:  September 05, 1940       BSA:          2.15 m Patient Age:    9 years        BP:           148/67 mmHg Patient Gender: F               HR:           75 bpm. Exam Location:  [facility] Procedure: 2D Echo, Color Doppler and Cardiac Doppler Indications:     I50.31 CHF-Acute Diastolic  History:         Patient has no prior history of Echocardiogram examinations.                  CHF, Pacemaker; Risk Factors:Hypertension and Diabetes.  Sonographer:     Charmayne Sheer RDCS (AE) Referring Phys:  [id number] [facility] Ryson Bacha Diagnosing Phys: Nelva Bush MD  Sonographer Comments: Suboptimal subcostal window and suboptimal apical window. IMPRESSIONS  1. Left ventricular ejection fraction, by visual estimation, is 55 to 60%. The left ventricle has normal function. There is borderline left ventricular hypertrophy.  2. Left ventricular diastolic parameters are consistent with Grade II diastolic dysfunction (pseudonormalization).  3. The left ventricle has no regional wall motion abnormalities.  4. Global  right ventricle has normal systolic function.The right ventricular size is normal. No increase in right ventricular wall thickness.  5. Left atrial size was mildly dilated.  6. Right atrial size was mildly dilated.  7. The pericardium was not well visualized.  8. The mitral valve is normal in structure. Trivial mitral valve regurgitation. No evidence of mitral stenosis.  9. The tricuspid valve is not well visualized. 10. The aortic valve is tricuspid. Aortic valve regurgitation is mild. Mild to moderate aortic valve sclerosis/calcification without any evidence of aortic stenosis. 11. Irregular thickening primarily involving the noncoronary leaflet most likely represents sclerosis though vegetation cannot be excluded. 12. The pulmonic valve was not well visualized. Pulmonic valve regurgitation is trivial. 13. Severely elevated pulmonary artery systolic pressure. 14. A pacer wire is visualized in the RV and RA. 15. The inferior vena cava is normal in size with greater than 50% respiratory variability, suggesting right atrial pressure of 3 mmHg. 16. The interatrial septum  was not well visualized. FINDINGS  Left Ventricle: Left ventricular ejection fraction, by visual estimation, is 55 to 60%. The left ventricle has normal function. The left ventricle has no regional wall motion abnormalities. The left ventricular internal cavity size was the left ventricle is normal in size. There is borderline left ventricular hypertrophy. Left ventricular diastolic parameters are consistent with Grade II diastolic dysfunction (pseudonormalization). Right Ventricle: The right ventricular size is normal. No increase in right ventricular wall thickness. Global RV systolic function is has normal systolic function. The tricuspid regurgitant velocity is 5.01 m/s, and with an assumed right atrial pressure  of 3 mmHg, the estimated right ventricular systolic pressure is severely elevated at 103.4 mmHg. Left Atrium: Left atrial size was  mildly dilated. Right Atrium: Right atrial size was mildly dilated Pericardium: The pericardium was not well visualized. Mitral Valve: The mitral valve is normal in structure. Trivial mitral valve regurgitation. No evidence of mitral valve stenosis by observation. MV peak gradient, 4.2 mmHg. Tricuspid Valve: The tricuspid valve is not well visualized. Tricuspid valve regurgitation moderate-severe. Aortic Valve: The aortic valve is tricuspid. Aortic valve regurgitation is mild. Aortic regurgitation PHT measures 607 msec. Mild to moderate aortic valve sclerosis/calcification is present, without any evidence of aortic stenosis. Aortic valve mean gradient measures 7.0 mmHg. Aortic valve peak gradient measures 14.6 mmHg. Aortic valve area, by VTI measures 2.02 cm. Irregular thickening primarily involving the noncoronary leaflet most likely represents sclerosis though vegetation cannot be excluded. Pulmonic Valve: The pulmonic valve was not well visualized. Pulmonic valve regurgitation is trivial. Pulmonic regurgitation is trivial. No evidence of pulmonic stenosis. Aorta: The aortic root is normal in size and structure. Pulmonary Artery: The pulmonary artery is not well seen. Venous: The inferior vena cava is normal in size with greater than 50% respiratory variability, suggesting right atrial pressure of 3 mmHg. IAS/Shunts: The interatrial septum was not well visualized. Additional Comments: A pacer wire is visualized in the right ventricle and right atrium.  LEFT VENTRICLE PLAX 2D LVIDd:         4.67 cm  Diastology LVIDs:         3.68 cm  LV e' lateral:   9.79 cm/s LV PW:         0.94 cm  LV E/e' lateral: 10.4 LV IVS:        0.95 cm  LV e' medial:    7.51 cm/s LVOT diam:     1.90 cm  LV E/e' medial:  13.6 LV SV:         43 ml LV SV Index:   19.01 LVOT Area:     2.84 cm  RIGHT VENTRICLE RV Basal diam:  3.50 cm TAPSE (M-mode): 2.3 cm LEFT ATRIUM           Index       RIGHT ATRIUM           Index LA diam:      3.90 cm  1.81 cm/m  RA Area:     21.30 cm LA Vol (A4C): 56.9 ml 26.48 ml/m RA Volume:   57.40 ml  26.71 ml/m  AORTIC VALVE                    PULMONIC VALVE AV Area (Vmax):    1.91 cm     PV Vmax:       1.66 m/s AV Area (Vmean):   1.99 cm     PV Vmean:      105.000  cm/s AV Area (VTI):     2.02 cm     PV VTI:        0.335 m AV Vmax:           191.00 cm/s  PV Peak grad:  11.0 mmHg AV Vmean:          123.000 cm/s PV Mean grad:  5.0 mmHg AV VTI:            0.401 m AV Peak Grad:      14.6 mmHg AV Mean Grad:      7.0 mmHg LVOT Vmax:         129.00 cm/s LVOT Vmean:        86.500 cm/s LVOT VTI:          0.285 m LVOT/AV VTI ratio: 0.71 AI PHT:            607 msec  AORTA Ao Root diam: 3.10 cm MITRAL VALVE                         TRICUSPID VALVE MV Area (PHT): 4.68 cm              TR Peak grad:   100.4 mmHg MV Peak grad:  4.2 mmHg              TR Vmax:        501.00 cm/s MV Mean grad:  2.0 mmHg MV Vmax:       1.03 m/s              SHUNTS MV Vmean:      68.3 cm/s             Systemic VTI:  0.29 m MV VTI:        0.25 m                Systemic Diam: 1.90 cm MV PHT:        46.98 msec MV Decel Time: 162 msec MV E velocity: 102.00 cm/s 103 cm/s MV A velocity: 84.40 cm/s  70.3 cm/s MV E/A ratio:  1.21        1.5  Harrell Gave End MD Electronically signed by Nelva Bush MD Signature Date/Time: 01/03/2019/3:15:04 PM    Final         Scheduled Meds:  amLODipine  5 mg Oral Daily   atorvastatin  20 mg Oral q1800   furosemide  40 mg Intravenous BID   heparin  5,000 Units Subcutaneous Q8H   hydrALAZINE  100 mg Oral Q8H   insulin aspart  0-9 Units Subcutaneous TID WC   sodium chloride flush  3 mL Intravenous Q12H   Continuous Infusions:  sodium chloride      Assessment & Plan:   Active Problems:   Acute CHF (congestive heart failure) (HCC)   Chronic renal impairment   Type 2 diabetes mellitus with stage 4 chronic kidney disease, without long-term current use of insulin ([place])  1. Acute chf/volume  overload-systolic v.s. diastolic.Also with CKD may be retaining . BNP elevated Given Lasix 80 IV x1 in the ED Continue on Lasix 40 mg IV twice daily today, if stable can switch to po in am Strict I's and O's Daily weight Echo with EF of 55 to 60%.  Grade 2 diastolic dysfunction. BNP is still elevated  2.A/CKD- unsure of baseline.Improving with diuresis. renal US-Cortical thinning and medical renal disease changes of both kidneys., LT small renal cyst Avoid nephrotoxic  medications and NSAIDs No proteinuria urinalysis   3. Anemia- possibly chronic from CKD. Baseline unknown No reports of bleed Monitor H&H   4. DM-ck HA1c Ck fs RISS Need med rec reconciliation to be done by phamacy, already discussed with them  5.HTN: Continue amlodipine. Resumed hyralazine home dose Add metoprolol 50 bid (home dose 100).  monitor     DVT prophylaxis: Heparin subcu Code Status: Full Family Communication:  None at bedside Disposition Plan: Possible DC in 1 to 2 days when medically more stable from chf stand point   LOS: 2 days   Time spent: 5 minutes with more than 50% COC    Nolberto Hanlon, MD [facility] Pager [phone]  If 7PM-7AM, please contact night-coverage [email or web address] Password [facility] 01/04/2019, 6:27 PM Patient ID: Zohar Pigue, female   DOB: Nov 16, 1940, 78 y.o.   MRN: [id number]

## 2019-01-05 DIAGNOSIS — I5033 Acute on chronic diastolic (congestive) heart failure: Secondary | ICD-10-CM

## 2019-01-05 DIAGNOSIS — E1122 Type 2 diabetes mellitus with diabetic chronic kidney disease: Secondary | ICD-10-CM

## 2019-01-05 DIAGNOSIS — D631 Anemia in chronic kidney disease: Secondary | ICD-10-CM

## 2019-01-05 DIAGNOSIS — N184 Chronic kidney disease, stage 4 (severe): Secondary | ICD-10-CM

## 2019-01-05 LAB — BASIC METABOLIC PANEL
Anion gap: 14 (ref 5–15)
BUN: 37 mg/dL — ABNORMAL HIGH (ref 8–23)
CO2: 27 mmol/L (ref 22–32)
Calcium: 9.6 mg/dL (ref 8.9–10.3)
Chloride: 102 mmol/L (ref 98–111)
Creatinine, Ser: 1.99 mg/dL — ABNORMAL HIGH (ref 0.44–1.00)
GFR calc Af Amer: 27 mL/min — ABNORMAL LOW (ref >60)
GFR calc non Af Amer: 23 mL/min — ABNORMAL LOW (ref >60)
Glucose, Bld: 164 mg/dL — ABNORMAL HIGH (ref 70–99)
Potassium: 3.9 mmol/L (ref 3.5–5.1)
Sodium: 143 mmol/L (ref 135–145)

## 2019-01-05 LAB — GLUCOSE, CAPILLARY
Glucose-Capillary: 168 mg/dL — ABNORMAL HIGH (ref 70–99)
Glucose-Capillary: 98 mg/dL (ref 70–99)
Glucose-Capillary: 152 mg/dL — ABNORMAL HIGH (ref 70–99)

## 2019-01-05 NOTE — TOC Transition Note (Signed)
Transition of Care [facility]) - CM/SW Discharge Note   Patient Details  Name: Acasia Skilton MRN: [id number] Date of Birth: 04-May-1940  Transition of Care [facility]) CM/SW Contact:  Candie Chroman, LCSW Phone Number: 01/05/2019, 4:15 PM   Clinical Narrative: CSW met with patient. No supports at bedside. CSW introduced role and explained that discharge planning would be discussed. Patient did not have home health or use DME prior to admission. She has a scale and has been educated on weighing daily. Patient has orders to discharge home today. Her son will pick her up around 5:00 when he gets off work. No further concerns. CSW signing off.    Final next level of care: Home/Self Care Barriers to Discharge: Barriers Resolved   Patient Goals and CMS Choice        Discharge Placement                       Discharge Plan and Services     Post Acute Care Choice: NA                               Social Determinants of Health (SDOH) Interventions     Readmission Risk Interventions No flowsheet data found.

## 2019-01-06 NOTE — Discharge Summary (Signed)
Physician Discharge Summary  Shanica Cregan [id number] DOB: 17-Dec-1940 DOA: 01/02/2019  PCP: System, Pcp Not In  Admit date: 01/02/2019 Discharge date: 01/05/2019  Recommendations for Outpatient Follow-up:  1. Patient is from [facility], she will follow-up with her nephrologist and cardiologist there, she was advised to follow-up closely.  Daily weights.  Avoid salt.     Discharge Diagnoses: Principal diagnosis is #1 1. 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 Anemia of CKD stage IV 4. Diabetes mellitus type 2 5. Essential hypertension  Discharge Condition: improved Disposition: home  Diet recommendation: Heart healthy, diabetic diet  Filed Weights   01/03/19 0440 01/04/19 0348 01/05/19 0459  Weight: 107.5 kg 103 kg 102.8 kg    History of present illness:  78 year old woman PMH CHF brought into the emergency department for lower extremity edema.  Admitted for acute on chronic CHF, CKD  Hospital Course:  She was treated with aggressive diuresis and gradually improved with excellent urine output.  Hospitalization was uncomplicated and she was discharged home in good condition.  She will follow-up with her nephrologist and cardiologist in [place].  She was advised on low-salt intake and daily weights.  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  Echocardiogram LVEF 55 to 60%.  Grade 2 diastolic dysfunction. 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  Ultrasound showed medical renal disease --Creatinine improved slightly with diuresis.  She follows with nephrologist as an outpatient in [place].  She has been advised to follow-up with the nephrologist.  Anemia of CKD stage IV --Stable during hospitalization  Diabetes mellitus type 2 --Stable during hospitalization  Essential hypertension --Stable, resume medications on discharge  Today's assessment: S: Feels a lot better, breathing better, lower extremity edema much improved, really  wants to go home.  Breathing fine. O: Vitals:  Vitals:   01/05/19 1415 01/05/19 1520  BP: 132/68 (!) 155/63  Pulse: 76 79  Resp: 18 18  Temp: 97.7 F (36.5 C) (!) 97.5 F (36.4 C)  SpO2: 97% 97%    Constitutional:  . Appears calm and comfortable Respiratory:  . CTA bilaterally, no w/r/r.  . Respiratory effort normal.  Cardiovascular:  . RRR, no m/r/g . 1+ bilateral lower extremity edema Psychiatric:  . judgement and insight appear normal . Mental status o Mood, affect appropriate  -5 liters since admission  Creatinine trending down, 1.99, remainder BMP unremarkable  Discharge Instructions  Discharge Instructions    (HEART FAILURE PATIENTS) Call MD:  Anytime you have any of the following symptoms: 1) 3 pound weight gain in 24 hours or 5 pounds in 1 week 2) shortness of breath, with or without a dry hacking cough 3) swelling in the hands, feet or stomach 4) if you have to sleep on extra pillows at night in order to breathe.   Complete by: As directed    Diet - low sodium heart healthy   Complete by: As directed    Discharge instructions   Complete by: As directed    Call your physician or seek immediate medical attention for swelling, weight gain, shortness of breath or worsening of condition.   Heart Failure patients record your daily weight using the same scale at the same time of day   Complete by: As directed    Increase activity slowly   Complete by: As directed      Allergies as of 01/05/2019   No Known Allergies     Medication List    STOP taking these medications  lisinopril 40 MG tablet Commonly known as: ZESTRIL     TAKE these medications   allopurinol 100 MG tablet Commonly known as: ZYLOPRIM Take 100 mg by mouth daily.   amLODipine 5 MG tablet Commonly known as: NORVASC Take 5 mg by mouth daily.   aspirin 81 MG chewable tablet Chew 81 mg by mouth daily.   atorvastatin 20 MG tablet Commonly known as: LIPITOR Take 20 mg by mouth  daily.   ferrous sulfate 325 (65 FE) MG tablet Take 325 mg by mouth 2 (two) times daily.   furosemide 40 MG tablet Commonly known as: LASIX Take 40 mg by mouth daily.   hydrALAZINE 100 MG tablet Commonly known as: APRESOLINE Take 100 mg by mouth 3 (three) times daily.   Levemir FlexTouch 100 UNIT/ML Pen Generic drug: Insulin Detemir SMARTSIG:6 Unit(s) SUB-Q Every Night   metoprolol tartrate 100 MG tablet Commonly known as: LOPRESSOR Take 100 mg by mouth 2 (two) times daily.      No Known Allergies  The results of significant diagnostics from this hospitalization (including imaging, microbiology, ancillary and laboratory) are listed below for reference.    Significant Diagnostic Studies: DG Chest 2 View  Result Date: 01/02/2019 CLINICAL DATA:  2-3 week history of shortness of breath and dry cough. EXAM: CHEST - 2 VIEW COMPARISON:  None. FINDINGS: 1233 hours. The cardio pericardial silhouette is enlarged. There is pulmonary vascular congestion without overt pulmonary edema. Patchy airspace disease noted at the bases compatible with atelectasis or infiltrate. No substantial pleural effusion. Single lead pacer/AICD noted. The visualized bony structures of the thorax are intact. IMPRESSION: Low volumes with cardiomegaly and bibasilar patchy atelectasis or infiltrate. Electronically Signed   By: Misty Stanley M.D.   On: 01/02/2019 13:05   US RENAL  Result Date: 01/02/2019 CLINICAL DATA:  Renal failure, history hypertension, diabetes mellitus, CHF EXAM: RENAL / URINARY TRACT ULTRASOUND COMPLETE COMPARISON:  None FINDINGS: Right Kidney: Renal measurements: 8.1 x 4.4 x 4.2 cm = volume: 81 mL. Cortical thinning. Increased cortical echogenicity. No mass, hydronephrosis or shadowing calcification. Left Kidney: Renal measurements: 8.4 x 4.8 x 4.6 cm = volume: 99 mL. Cortical thinning. Increased cortical echogenicity. Small cyst at upper pole 1.3 x 1.3 x 1.6 cm. Additional cyst at inferior pole  2.6 x 1.7 x 2.2 cm. No additional mass, hydronephrosis, or shadowing calcification. Bladder: Appears normal for degree of bladder distention. Ureteral jets were not visualized. Other: N/A IMPRESSION: Cortical thinning and medical renal disease changes of both kidneys. Small LEFT renal cysts. No additional renal masses or hydronephrosis. Electronically Signed   By: Lavonia Dana M.D.   On: 01/02/2019 19:01   US Venous Img Lower Bilateral  Result Date: 01/02/2019 CLINICAL DATA:  Bilateral lower extremity edema and swelling EXAM: BILATERAL LOWER EXTREMITY VENOUS DOPPLER ULTRASOUND TECHNIQUE: Gray-scale sonography with graded compression, as well as color Doppler and duplex ultrasound were performed to evaluate the lower extremity deep venous systems from the level of the common femoral vein and including the common femoral, femoral, profunda femoral, popliteal and calf veins including the posterior tibial, peroneal and gastrocnemius veins when visible. The superficial great saphenous vein was also interrogated. Spectral Doppler was utilized to evaluate flow at rest and with distal augmentation maneuvers in the common femoral, femoral and popliteal veins. COMPARISON:  None. FINDINGS: RIGHT LOWER EXTREMITY Common Femoral Vein: No evidence of thrombus. Normal compressibility, respiratory phasicity and response to augmentation. Saphenofemoral Junction: No evidence of thrombus. Normal compressibility and flow on color Doppler  imaging. Profunda Femoral Vein: No evidence of thrombus. Normal compressibility and flow on color Doppler imaging. Femoral Vein: No evidence of thrombus. Normal compressibility, respiratory phasicity and response to augmentation. Popliteal Vein: No evidence of thrombus. Normal compressibility, respiratory phasicity and response to augmentation. Calf Veins: Somewhat limited visualization. Superficial Great Saphenous Vein: No evidence of thrombus. Normal compressibility. Venous Reflux:  None. Other  Findings:  None. LEFT LOWER EXTREMITY Common Femoral Vein: No evidence of thrombus. Normal compressibility, respiratory phasicity and response to augmentation. Saphenofemoral Junction: No evidence of thrombus. Normal compressibility and flow on color Doppler imaging. Profunda Femoral Vein: No evidence of thrombus. Normal compressibility and flow on color Doppler imaging. Femoral Vein: No evidence of thrombus. Normal compressibility, respiratory phasicity and response to augmentation. Popliteal Vein: No evidence of thrombus. Normal compressibility, respiratory phasicity and response to augmentation. Calf Veins: Somewhat limited visualization. Superficial Great Saphenous Vein: No evidence of thrombus. Normal compressibility. Venous Reflux:  None. Other Findings: Somewhat limited visualization of the calf vessels due to overlying subcutaneous edema. Bilateral inguinal adenopathy is seen. IMPRESSION: No evidence of deep venous thrombosis in either lower extremity. Bilateral lower extremity subcutaneous edema. Bilateral groin adenopathy. Electronically Signed   By: Prudencio Pair M.D.   On: 01/02/2019 19:09   ECHOCARDIOGRAM COMPLETE  Result Date: 01/03/2019   ECHOCARDIOGRAM REPORT   Patient Name:   CHRISSI FACENDA Date of Exam: 01/03/2019 Medical Rec #:  [id number]       Height:       66.0 in Accession #:    [id number]      Weight:       236.9 lb Date of Birth:  07-28-1940       BSA:          2.15 m Patient Age:    41 years        BP:           148/67 mmHg Patient Gender: F               HR:           75 bpm. Exam Location:  [facility] Procedure: 2D Echo, Color Doppler and Cardiac Doppler Indications:     I50.31 CHF-Acute Diastolic  History:         Patient has no prior history of Echocardiogram examinations.                  CHF, Pacemaker; Risk Factors:Hypertension and Diabetes.  Sonographer:     Charmayne Sheer RDCS (AE) Referring Phys:  [id number] [facility] AMERY Diagnosing Phys: Nelva Bush MD  Sonographer Comments: Suboptimal  subcostal window and suboptimal apical window. IMPRESSIONS  1. Left ventricular ejection fraction, by visual estimation, is 55 to 60%. The left ventricle has normal function. There is borderline left ventricular hypertrophy.  2. Left ventricular diastolic parameters are consistent with Grade II diastolic dysfunction (pseudonormalization).  3. The left ventricle has no regional wall motion abnormalities.  4. Global right ventricle has normal systolic function.The right ventricular size is normal. No increase in right ventricular wall thickness.  5. Left atrial size was mildly dilated.  6. Right atrial size was mildly dilated.  7. The pericardium was not well visualized.  8. The mitral valve is normal in structure. Trivial mitral valve regurgitation. No evidence of mitral stenosis.  9. The tricuspid valve is not well visualized. 10. The aortic valve is tricuspid. Aortic valve regurgitation is mild. Mild to moderate aortic valve sclerosis/calcification without any evidence of aortic stenosis. 11. Irregular thickening  primarily involving the noncoronary leaflet most likely represents sclerosis though vegetation cannot be excluded. 12. The pulmonic valve was not well visualized. Pulmonic valve regurgitation is trivial. 13. Severely elevated pulmonary artery systolic pressure. 14. A pacer wire is visualized in the RV and RA. 15. The inferior vena cava is normal in size with greater than 50% respiratory variability, suggesting right atrial pressure of 3 mmHg. 16. The interatrial septum was not well visualized. FINDINGS  Left Ventricle: Left ventricular ejection fraction, by visual estimation, is 55 to 60%. The left ventricle has normal function. The left ventricle has no regional wall motion abnormalities. The left ventricular internal cavity size was the left ventricle is normal in size. There is borderline left ventricular hypertrophy. Left ventricular diastolic parameters are consistent with Grade II diastolic  dysfunction (pseudonormalization). Right Ventricle: The right ventricular size is normal. No increase in right ventricular wall thickness. Global RV systolic function is has normal systolic function. The tricuspid regurgitant velocity is 5.01 m/s, and with an assumed right atrial pressure  of 3 mmHg, the estimated right ventricular systolic pressure is severely elevated at 103.4 mmHg. Left Atrium: Left atrial size was mildly dilated. Right Atrium: Right atrial size was mildly dilated Pericardium: The pericardium was not well visualized. Mitral Valve: The mitral valve is normal in structure. Trivial mitral valve regurgitation. No evidence of mitral valve stenosis by observation. MV peak gradient, 4.2 mmHg. Tricuspid Valve: The tricuspid valve is not well visualized. Tricuspid valve regurgitation moderate-severe. Aortic Valve: The aortic valve is tricuspid. Aortic valve regurgitation is mild. Aortic regurgitation PHT measures 607 msec. Mild to moderate aortic valve sclerosis/calcification is present, without any evidence of aortic stenosis. Aortic valve mean gradient measures 7.0 mmHg. Aortic valve peak gradient measures 14.6 mmHg. Aortic valve area, by VTI measures 2.02 cm. Irregular thickening primarily involving the noncoronary leaflet most likely represents sclerosis though vegetation cannot be excluded. Pulmonic Valve: The pulmonic valve was not well visualized. Pulmonic valve regurgitation is trivial. Pulmonic regurgitation is trivial. No evidence of pulmonic stenosis. Aorta: The aortic root is normal in size and structure. Pulmonary Artery: The pulmonary artery is not well seen. Venous: The inferior vena cava is normal in size with greater than 50% respiratory variability, suggesting right atrial pressure of 3 mmHg. IAS/Shunts: The interatrial septum was not well visualized. Additional Comments: A pacer wire is visualized in the right ventricle and right atrium.  LEFT VENTRICLE PLAX 2D LVIDd:         4.67 cm   Diastology LVIDs:         3.68 cm  LV e' lateral:   9.79 cm/s LV PW:         0.94 cm  LV E/e' lateral: 10.4 LV IVS:        0.95 cm  LV e' medial:    7.51 cm/s LVOT diam:     1.90 cm  LV E/e' medial:  13.6 LV SV:         43 ml LV SV Index:   19.01 LVOT Area:     2.84 cm  RIGHT VENTRICLE RV Basal diam:  3.50 cm TAPSE (M-mode): 2.3 cm LEFT ATRIUM           Index       RIGHT ATRIUM           Index LA diam:      3.90 cm 1.81 cm/m  RA Area:     21.30 cm LA Vol (A4C): 56.9 ml 26.48 ml/m RA Volume:  57.40 ml  26.71 ml/m  AORTIC VALVE                    PULMONIC VALVE AV Area (Vmax):    1.91 cm     PV Vmax:       1.66 m/s AV Area (Vmean):   1.99 cm     PV Vmean:      105.000 cm/s AV Area (VTI):     2.02 cm     PV VTI:        0.335 m AV Vmax:           191.00 cm/s  PV Peak grad:  11.0 mmHg AV Vmean:          123.000 cm/s PV Mean grad:  5.0 mmHg AV VTI:            0.401 m AV Peak Grad:      14.6 mmHg AV Mean Grad:      7.0 mmHg LVOT Vmax:         129.00 cm/s LVOT Vmean:        86.500 cm/s LVOT VTI:          0.285 m LVOT/AV VTI ratio: 0.71 AI PHT:            607 msec  AORTA Ao Root diam: 3.10 cm MITRAL VALVE                         TRICUSPID VALVE MV Area (PHT): 4.68 cm              TR Peak grad:   100.4 mmHg MV Peak grad:  4.2 mmHg              TR Vmax:        501.00 cm/s MV Mean grad:  2.0 mmHg MV Vmax:       1.03 m/s              SHUNTS MV Vmean:      68.3 cm/s             Systemic VTI:  0.29 m MV VTI:        0.25 m                Systemic Diam: 1.90 cm MV PHT:        46.98 msec MV Decel Time: 162 msec MV E velocity: 102.00 cm/s 103 cm/s MV A velocity: 84.40 cm/s  70.3 cm/s MV E/A ratio:  1.21        1.5  Harrell Gave End MD Electronically signed by Nelva Bush MD Signature Date/Time: 01/03/2019/3:15:04 PM    Final     Microbiology: Recent Results (from the past 240 hour(s))  SARS CORONAVIRUS 2 (TAT 6-24 HRS) Nasopharyngeal Nasopharyngeal Swab     Status: None   Collection Time: 01/03/19  9:07 AM    Specimen: Nasopharyngeal Swab  Result Value Ref Range Status   SARS Coronavirus 2 NEGATIVE NEGATIVE Final    Comment: (NOTE) SARS-CoV-2 target nucleic acids are NOT DETECTED. The SARS-CoV-2 RNA is generally detectable in upper and lower respiratory specimens during the acute phase of infection. Negative results do not preclude SARS-CoV-2 infection, do not rule out co-infections with other pathogens, and should not be used as the sole basis for treatment or other patient management decisions. Negative results must be combined with clinical observations, patient history, and epidemiological information. The expected result is Negative. Fact Sheet  for Patients: SugarRoll.be Fact Sheet for Healthcare Providers: https://www.woods-mathews.com/ This test is not yet approved or cleared by the [place] FDA and  has been authorized for detection and/or diagnosis of SARS-CoV-2 by FDA under an Emergency Use Authorization (EUA). This EUA will remain  in effect (meaning this test can be used) for the duration of the COVID-19 declaration under Section 56 4(b)(1) of the Act, 21 U.S.C. section 360bbb-3(b)(1), unless the authorization is terminated or revoked sooner. Performed at [facility], [place] [address] [place], [place] [address]      Labs: Basic Metabolic Panel: Recent Labs  Lab 01/02/19 1208 01/03/19 0554 01/04/19 0511 01/05/19 0700  NA 142 144 144 143  K 4.8 4.7 4.4 3.9  CL 110 108 104 102  CO2 21* 26 26 27   GLUCOSE 151* 134* 231* 164*  BUN 48* 42* 38* 37*  CREATININE 2.68* 2.21* 2.13* 1.99*  CALCIUM 8.9 9.1 9.5 9.6   CBC: Recent Labs  Lab 01/02/19 1208  WBC 5.7  HGB 11.2*  HCT 35.8*  MCV 94.0  PLT 235    Recent Labs    01/02/19 1209 01/04/19 0511  BNP 1,220.0* 1,196.0*    CBG: Recent Labs  Lab 01/04/19 1733 01/04/19 2102 01/05/19 0826 01/05/19 1124 01/05/19 1603  GLUCAP 157* 204* 168* 98 152*     Active Problems:   Acute on chronic congestive heart failure (HCC)   Chronic renal impairment   Type 2 diabetes mellitus with stage 4 chronic kidney disease, without long-term current use of insulin ([place])   Essential hypertension   Time coordinating discharge: 45 minutes  Signed:  Murray Hodgkins, MD  [facility]  01/06/2019, 6:05 PM

## 2019-01-21 ENCOUNTER — Emergency Department: Payer: Medicare Other

## 2019-01-21 ENCOUNTER — Other Ambulatory Visit: Payer: Self-pay

## 2019-01-21 DIAGNOSIS — I272 Pulmonary hypertension, unspecified: Secondary | ICD-10-CM | POA: Insufficient documentation

## 2019-01-21 DIAGNOSIS — Z7982 Long term (current) use of aspirin: Secondary | ICD-10-CM | POA: Insufficient documentation

## 2019-01-21 DIAGNOSIS — E785 Hyperlipidemia, unspecified: Secondary | ICD-10-CM | POA: Insufficient documentation

## 2019-01-21 DIAGNOSIS — N184 Chronic kidney disease, stage 4 (severe): Secondary | ICD-10-CM | POA: Diagnosis not present

## 2019-01-21 DIAGNOSIS — D649 Anemia, unspecified: Secondary | ICD-10-CM | POA: Diagnosis not present

## 2019-01-21 DIAGNOSIS — Z794 Long term (current) use of insulin: Secondary | ICD-10-CM | POA: Insufficient documentation

## 2019-01-21 DIAGNOSIS — M109 Gout, unspecified: Secondary | ICD-10-CM | POA: Diagnosis not present

## 2019-01-21 DIAGNOSIS — R609 Edema, unspecified: Secondary | ICD-10-CM | POA: Diagnosis present

## 2019-01-21 DIAGNOSIS — Z79899 Other long term (current) drug therapy: Secondary | ICD-10-CM | POA: Insufficient documentation

## 2019-01-21 DIAGNOSIS — Z20822 Contact with and (suspected) exposure to covid-19: Secondary | ICD-10-CM | POA: Diagnosis not present

## 2019-01-21 DIAGNOSIS — I5033 Acute on chronic diastolic (congestive) heart failure: Principal | ICD-10-CM | POA: Insufficient documentation

## 2019-01-21 DIAGNOSIS — I130 Hypertensive heart and chronic kidney disease with heart failure and stage 1 through stage 4 chronic kidney disease, or unspecified chronic kidney disease: Secondary | ICD-10-CM | POA: Diagnosis not present

## 2019-01-21 DIAGNOSIS — E1122 Type 2 diabetes mellitus with diabetic chronic kidney disease: Secondary | ICD-10-CM | POA: Diagnosis not present

## 2019-01-21 LAB — CBC
HCT: 33.3 % — ABNORMAL LOW (ref 36.0–46.0)
Hemoglobin: 10.5 g/dL — ABNORMAL LOW (ref 12.0–15.0)
MCH: 30.1 pg (ref 26.0–34.0)
MCHC: 31.5 g/dL (ref 30.0–36.0)
MCV: 95.4 fL (ref 80.0–100.0)
Platelets: 226 10*3/uL (ref 150–400)
RBC: 3.49 MIL/uL — ABNORMAL LOW (ref 3.87–5.11)
RDW: 17.3 % — ABNORMAL HIGH (ref 11.5–15.5)
WBC: 6.2 10*3/uL (ref 4.0–10.5)
nRBC: 0 % (ref 0.0–0.2)

## 2019-01-21 LAB — BASIC METABOLIC PANEL
Anion gap: 10 (ref 5–15)
BUN: 61 mg/dL — ABNORMAL HIGH (ref 8–23)
CO2: 26 mmol/L (ref 22–32)
Calcium: 9.2 mg/dL (ref 8.9–10.3)
Chloride: 108 mmol/L (ref 98–111)
Creatinine, Ser: 3.12 mg/dL — ABNORMAL HIGH (ref 0.44–1.00)
GFR calc Af Amer: 16 mL/min — ABNORMAL LOW (ref >60)
GFR calc non Af Amer: 14 mL/min — ABNORMAL LOW (ref >60)
Glucose, Bld: 118 mg/dL — ABNORMAL HIGH (ref 70–99)
Potassium: 4.8 mmol/L (ref 3.5–5.1)
Sodium: 144 mmol/L (ref 135–145)

## 2019-01-21 LAB — BRAIN NATRIURETIC PEPTIDE: B Natriuretic Peptide: 1199 pg/mL — ABNORMAL HIGH (ref 0.0–100.0)

## 2019-01-21 NOTE — ED Triage Notes (Signed)
Pt to the er for bilateral ankle swelling. Pt takes a fluid pill. Pt says the lat time she came in December she was admitted and had fluid drawn off. Pt denies SOB, pt reports tired in the legs due to the increased weight.

## 2019-01-21 NOTE — ED Notes (Signed)
Daughter Tyshawna Stottlemyre called to check on mother. Daughter states that the patient has not been able to drive due to leg swelling which is new for her. Daughter request that she be called and updated when patient gets into a room. [phone].

## 2019-01-22 ENCOUNTER — Encounter: Payer: Self-pay | Admitting: Family Medicine

## 2019-01-22 ENCOUNTER — Observation Stay
Admission: EM | Admit: 2019-01-22 | Discharge: 2019-01-23 | Disposition: A | Discharge status: Home / Self Care | Payer: Medicare Other | Attending: Family Medicine | Admitting: Family Medicine

## 2019-01-22 DIAGNOSIS — N189 Chronic kidney disease, unspecified: Secondary | ICD-10-CM

## 2019-01-22 DIAGNOSIS — N179 Acute kidney failure, unspecified: Secondary | ICD-10-CM

## 2019-01-22 DIAGNOSIS — E1122 Type 2 diabetes mellitus with diabetic chronic kidney disease: Secondary | ICD-10-CM

## 2019-01-22 DIAGNOSIS — I10 Essential (primary) hypertension: Secondary | ICD-10-CM | POA: Diagnosis not present

## 2019-01-22 DIAGNOSIS — I509 Heart failure, unspecified: Secondary | ICD-10-CM

## 2019-01-22 DIAGNOSIS — I5033 Acute on chronic diastolic (congestive) heart failure: Secondary | ICD-10-CM | POA: Diagnosis not present

## 2019-01-22 DIAGNOSIS — R600 Localized edema: Secondary | ICD-10-CM

## 2019-01-22 DIAGNOSIS — N184 Chronic kidney disease, stage 4 (severe): Secondary | ICD-10-CM

## 2019-01-22 DIAGNOSIS — Z794 Long term (current) use of insulin: Secondary | ICD-10-CM

## 2019-01-22 DIAGNOSIS — R609 Edema, unspecified: Secondary | ICD-10-CM

## 2019-01-22 DIAGNOSIS — I272 Pulmonary hypertension, unspecified: Secondary | ICD-10-CM

## 2019-01-22 LAB — CBC WITH DIFFERENTIAL/PLATELET
Abs Immature Granulocytes: 0.06 10*3/uL (ref 0.00–0.07)
Basophils Absolute: 0 10*3/uL (ref 0.0–0.1)
Basophils Relative: 1 %
Eosinophils Absolute: 0.1 10*3/uL (ref 0.0–0.5)
Eosinophils Relative: 3 %
HCT: 31.9 % — ABNORMAL LOW (ref 36.0–46.0)
Hemoglobin: 10 g/dL — ABNORMAL LOW (ref 12.0–15.0)
Immature Granulocytes: 1 %
Lymphocytes Relative: 13 %
Lymphs Abs: 0.6 10*3/uL — ABNORMAL LOW (ref 0.7–4.0)
MCH: 29.6 pg (ref 26.0–34.0)
MCHC: 31.3 g/dL (ref 30.0–36.0)
MCV: 94.4 fL (ref 80.0–100.0)
Monocytes Absolute: 0.5 10*3/uL (ref 0.1–1.0)
Monocytes Relative: 11 %
Neutro Abs: 3.3 10*3/uL (ref 1.7–7.7)
Neutrophils Relative %: 71 %
Platelets: 240 10*3/uL (ref 150–400)
RBC: 3.38 MIL/uL — ABNORMAL LOW (ref 3.87–5.11)
RDW: 16.9 % — ABNORMAL HIGH (ref 11.5–15.5)
WBC: 4.7 10*3/uL (ref 4.0–10.5)
nRBC: 0 % (ref 0.0–0.2)

## 2019-01-22 LAB — GLUCOSE, CAPILLARY
Glucose-Capillary: 120 mg/dL — ABNORMAL HIGH (ref 70–99)
Glucose-Capillary: 142 mg/dL — ABNORMAL HIGH (ref 70–99)
Glucose-Capillary: 109 mg/dL — ABNORMAL HIGH (ref 70–99)
Glucose-Capillary: 139 mg/dL — ABNORMAL HIGH (ref 70–99)

## 2019-01-22 LAB — RESPIRATORY PANEL BY RT PCR (FLU A&B, COVID)
Influenza A by PCR: NEGATIVE
Influenza B by PCR: NEGATIVE
SARS Coronavirus 2 by RT PCR: NEGATIVE

## 2019-01-22 LAB — TROPONIN I (HIGH SENSITIVITY)
Troponin I (High Sensitivity): 18 ng/L — ABNORMAL HIGH (ref <18)
Troponin I (High Sensitivity): 21 ng/L — ABNORMAL HIGH (ref <18)

## 2019-01-22 MED ORDER — ALLOPURINOL 100 MG PO TABS: 100.0000 mg | ORAL_TABLET | Freq: Every day | ORAL | Status: DC

## 2019-01-22 MED ORDER — HYDRALAZINE HCL 50 MG PO TABS
100.0000 mg | ORAL_TABLET | Freq: Three times a day (TID) | ORAL | Status: DC
  Administered 2019-01-22 – 2019-01-23 (×4): 100 mg via ORAL
  Filled 2019-01-22 (×4): qty 2

## 2019-01-22 MED ORDER — FERROUS SULFATE 325 (65 FE) MG PO TABS
325.0000 mg | ORAL_TABLET | Freq: Two times a day (BID) | ORAL | Status: DC
  Administered 2019-01-22 – 2019-01-23 (×3): 325 mg via ORAL
  Filled 2019-01-22 (×5): qty 1

## 2019-01-22 MED ORDER — METOPROLOL TARTRATE 50 MG PO TABS
100.0000 mg | ORAL_TABLET | Freq: Two times a day (BID) | ORAL | Status: DC
  Administered 2019-01-22 – 2019-01-23 (×3): 100 mg via ORAL
  Filled 2019-01-22 (×3): qty 2

## 2019-01-22 MED ORDER — SODIUM CHLORIDE 0.9 % IV SOLN: 250.0000 mL | INTRAVENOUS | Status: DC | PRN

## 2019-01-22 MED ORDER — SODIUM CHLORIDE 0.9 % IV SOLN: 100.0000 mg | Freq: Every day | INTRAVENOUS | Status: DC

## 2019-01-22 MED ORDER — FUROSEMIDE 40 MG PO TABS: 40.0000 mg | ORAL_TABLET | Freq: Every day | ORAL | Status: DC

## 2019-01-22 MED ORDER — ASPIRIN EC 81 MG PO TBEC: 81.0000 mg | DELAYED_RELEASE_TABLET | Freq: Every day | ORAL | Status: DC

## 2019-01-22 MED ORDER — ASPIRIN 81 MG PO CHEW
81.0000 mg | CHEWABLE_TABLET | Freq: Every day | ORAL | Status: DC
  Administered 2019-01-22 – 2019-01-23 (×2): 81 mg via ORAL
  Filled 2019-01-22 (×2): qty 1

## 2019-01-22 MED ORDER — AMLODIPINE BESYLATE 5 MG PO TABS
5.0000 mg | ORAL_TABLET | Freq: Every day | ORAL | Status: DC
  Administered 2019-01-23: 5 mg via ORAL
  Filled 2019-01-22 (×2): qty 1

## 2019-01-22 MED ORDER — ONDANSETRON HCL 4 MG/2ML IJ SOLN: 4.0000 mg | Freq: Four times a day (QID) | INTRAMUSCULAR | Status: DC | PRN

## 2019-01-22 MED ORDER — ACETAMINOPHEN 325 MG PO TABS: 650.0000 mg | ORAL_TABLET | ORAL | Status: DC | PRN

## 2019-01-22 MED ORDER — INSULIN DETEMIR 100 UNIT/ML ~~LOC~~ SOLN
4.0000 [IU] | Freq: Every day | SUBCUTANEOUS | Status: DC
  Administered 2019-01-22: 4 [IU] via SUBCUTANEOUS
  Filled 2019-01-22 (×2): qty 0.04

## 2019-01-22 MED ORDER — SODIUM CHLORIDE 0.9% FLUSH
3.0000 mL | Freq: Two times a day (BID) | INTRAVENOUS | Status: DC
  Administered 2019-01-22 (×3): 3 mL via INTRAVENOUS

## 2019-01-22 MED ORDER — HEPARIN SODIUM (PORCINE) 5000 UNIT/ML IJ SOLN
5000.0000 [IU] | Freq: Three times a day (TID) | INTRAMUSCULAR | Status: DC
  Administered 2019-01-22 – 2019-01-23 (×3): 5000 [IU] via SUBCUTANEOUS
  Filled 2019-01-22 (×3): qty 1

## 2019-01-22 MED ORDER — INSULIN ASPART 100 UNIT/ML ~~LOC~~ SOLN
0.0000 [IU] | Freq: Three times a day (TID) | SUBCUTANEOUS | Status: DC
  Administered 2019-01-22: 1 [IU] via SUBCUTANEOUS
  Filled 2019-01-22: qty 1

## 2019-01-22 MED ORDER — SODIUM CHLORIDE 0.9% FLUSH: 3.0000 mL | INTRAVENOUS | Status: DC | PRN

## 2019-01-22 MED ORDER — FUROSEMIDE 10 MG/ML IJ SOLN: 60.0000 mg | Freq: Two times a day (BID) | INTRAMUSCULAR | Status: DC

## 2019-01-22 MED ORDER — ALLOPURINOL 100 MG PO TABS
50.0000 mg | ORAL_TABLET | Freq: Every day | ORAL | Status: DC
  Administered 2019-01-22 – 2019-01-23 (×2): 50 mg via ORAL
  Filled 2019-01-22 (×2): qty 0.5

## 2019-01-22 MED ORDER — FUROSEMIDE 10 MG/ML IJ SOLN
40.0000 mg | Freq: Two times a day (BID) | INTRAMUSCULAR | Status: DC
  Administered 2019-01-22 – 2019-01-23 (×2): 40 mg via INTRAVENOUS
  Filled 2019-01-22 (×2): qty 4

## 2019-01-22 MED ORDER — BUMETANIDE 0.25 MG/ML IJ SOLN
1.0000 mg | Freq: Once | INTRAMUSCULAR | Status: AC
  Administered 2019-01-22: 1 mg via INTRAVENOUS
  Filled 2019-01-22: qty 4

## 2019-01-22 MED ORDER — ATORVASTATIN CALCIUM 20 MG PO TABS
20.0000 mg | ORAL_TABLET | Freq: Every day | ORAL | Status: DC
  Administered 2019-01-22: 18:00:00 20 mg via ORAL
  Filled 2019-01-22: qty 1

## 2019-01-22 MED ORDER — ENOXAPARIN SODIUM 40 MG/0.4ML ~~LOC~~ SOLN: 40.0000 mg | SUBCUTANEOUS | Status: DC

## 2019-01-22 NOTE — ED Notes (Signed)
This RN and Truman Hayward, RN attempted IV access no success

## 2019-01-22 NOTE — Consult Note (Signed)
[address] [place], [place] [address] Phone [phone]. Fax [phone]  Date: 01/22/2019                  Patient Name:  Michele Beck  MRN: [id number]  DOB: 07/03/1940  Age / Sex: 79 y.o., female         PCP: System, Pcp Not In                 Service Requesting Consult: IM/ Thornell Mule, MD                 Reason for Consult: ARF            History of Present Illness: Patient is a 79 y.o. female with medical problems of diastolic CHF, DM-2, HTN who was admitted to [facility] on 01/22/2019 for evaluation of Worsening LE edema.  Covid Ag neg CXR mild Pulm congestion States that she presented to the emergency room for worsening lower extremity edema She reports compliance with outpatient diuretics At this time, she is feeling better Reports that with current treatments, edema has improved but not resolved States she is followed by urologist in general  Medications: Outpatient medications: Medications Prior to Admission  Medication Sig Dispense Refill Last Dose  . allopurinol (ZYLOPRIM) 100 MG tablet Take 100 mg by mouth daily.   01/21/2019 at Unknown time  . amLODipine (NORVASC) 5 MG tablet Take 5 mg by mouth daily.   01/21/2019 at Unknown time  . aspirin 81 MG chewable tablet Chew 81 mg by mouth daily.   01/21/2019 at Unknown time  . atorvastatin (LIPITOR) 20 MG tablet Take 20 mg by mouth daily.   01/21/2019 at Unknown time  . ferrous sulfate 325 (65 FE) MG tablet Take 325 mg by mouth 2 (two) times daily.   01/21/2019 at Unknown time  . furosemide (LASIX) 40 MG tablet Take 40 mg by mouth daily.   01/21/2019 at Unknown time  . hydrALAZINE (APRESOLINE) 100 MG tablet Take 100 mg by mouth 3 (three) times daily.   01/21/2019 at Unknown time  . LEVEMIR FLEXTOUCH 100 UNIT/ML Pen SMARTSIG:6 Unit(s) SUB-Q Every Night   01/21/2019 at Unknown time  . metoprolol tartrate (LOPRESSOR) 100 MG tablet Take 100 mg by mouth 2 (two) times daily.   01/21/2019 at Unknown time    Current  medications: Current Facility-Administered Medications  Medication Dose Route Frequency Provider Last Rate Last Admin  . 0.9 %  sodium chloride infusion  250 mL Intravenous PRN Mansy, Jan A, MD      . acetaminophen (TYLENOL) tablet 650 mg  650 mg Oral Q4H PRN Mansy, Jan A, MD      . allopurinol (ZYLOPRIM) tablet 50 mg  50 mg Oral Daily Mansy, Jan A, MD   50 mg at 01/22/19 1045  . amLODipine (NORVASC) tablet 5 mg  5 mg Oral Daily Mansy, Jan A, MD      . aspirin chewable tablet 81 mg  81 mg Oral Daily Mansy, Jan A, MD   81 mg at 01/22/19 1033  . atorvastatin (LIPITOR) tablet 20 mg  20 mg Oral Daily Mansy, Jan A, MD      . ferrous sulfate tablet 325 mg  325 mg Oral BID Mansy, Jan A, MD   325 mg at 01/22/19 1034  . furosemide (LASIX) injection 40 mg  40 mg Intravenous Q12H Thornell Mule, MD      . heparin injection 5,000 Units  5,000 Units Subcutaneous Q8H Mansy, Jan A,  MD      . hydrALAZINE (APRESOLINE) tablet 100 mg  100 mg Oral TID Mansy, Jan A, MD   100 mg at 01/22/19 1034  . insulin aspart (novoLOG) injection 0-9 Units  0-9 Units Subcutaneous TID WC Mansy, Jan A, MD      . insulin detemir (LEVEMIR) injection 4 Units  4 Units Subcutaneous Q2200 Mansy, Jan A, MD      . metoprolol tartrate (LOPRESSOR) tablet 100 mg  100 mg Oral BID Mansy, Jan A, MD   100 mg at 01/22/19 1033  . ondansetron (ZOFRAN) injection 4 mg  4 mg Intravenous Q6H PRN Mansy, Jan A, MD      . sodium chloride flush (NS) 0.9 % injection 3 mL  3 mL Intravenous Q12H Mansy, Jan A, MD   3 mL at 01/22/19 1046  . sodium chloride flush (NS) 0.9 % injection 3 mL  3 mL Intravenous PRN Mansy, Arvella Merles, MD          Allergies: No Known Allergies    Past Medical History: Past Medical History:  Diagnosis Date  . Arthritis   . CHF (congestive heart failure) ([place])   . Diabetes mellitus without complication ([place])   . Hypertension   . Renal disorder      Past Surgical History: Past Surgical History:  Procedure Laterality Date  .  BACK SURGERY    . PACEMAKER PLACEMENT    . TUBAL LIGATION       Family History: History reviewed. No pertinent family history.   Social History: Social History   Socioeconomic History  . Marital status: Single    Spouse name: Not on file  . Number of children: Not on file  . Years of education: Not on file  . Highest education level: Not on file  Occupational History  . Not on file  Tobacco Use  . Smoking status: Never Smoker  . Smokeless tobacco: Never Used  Substance and Sexual Activity  . Alcohol use: Never  . Drug use: Never  . Sexual activity: Not on file  Other Topics Concern  . Not on file  Social History Narrative  . Not on file   Social Determinants of Health   Financial Resource Strain:   . Difficulty of Paying Living Expenses: Not on file  Food Insecurity:   . Worried About Charity fundraiser in the Last Year: Not on file  . Ran Out of Food in the Last Year: Not on file  Transportation Needs:   . Lack of Transportation (Medical): Not on file  . Lack of Transportation (Non-Medical): Not on file  Physical Activity:   . Days of Exercise per Week: Not on file  . Minutes of Exercise per Session: Not on file  Stress:   . Feeling of Stress : Not on file  Social Connections:   . Frequency of Communication with Friends and Family: Not on file  . Frequency of Social Gatherings with Friends and Family: Not on file  . Attends Religious Services: Not on file  . Active Member of Clubs or Organizations: Not on file  . Attends Archivist Meetings: Not on file  . Marital Status: Not on file  Intimate Partner Violence:   . Fear of Current or Ex-Partner: Not on file  . Emotionally Abused: Not on file  . Physically Abused: Not on file  . Sexually Abused: Not on file     Review of Systems: Gen: No fevers chills or weight changes HEENT:  No vision or hearing complaints CV: Some dyspnea on exertion, worsening lower extremity edema Resp: No cough,  shortness of breath or hemoptysis GI: Appetite has been good. no nausea, vomiting or diarrhea GU : No problems with voiding.  No hematuria MS: Denies any acute joint swellings or effusions Derm:    No complaints Psych: No complaints Heme: No complaints Neuro: No complaints Endocrine.  No complaints  Vital Signs: Blood pressure 120/80, pulse 71, temperature (!) 97.5 F (36.4 C), temperature source Oral, resp. rate 18, height 5\' 5"  (1.651 m), weight 106.2 kg, SpO2 94 %.   Intake/Output Summary (Last 24 hours) at 01/22/2019 1143 Last data filed at 01/22/2019 0422 Gross per 24 hour  Intake --  Output 450 ml  Net -450 ml    Weight trends: Filed Weights   01/21/19 1913 01/22/19 0458  Weight: 99.8 kg 106.2 kg    Physical Exam: General:  No acute distress, sitting up in the bed  HEENT  anicteric, moist oral mucous membranes  Lungs:  Normal breathing effort on room air, clear to auscultation  Heart::  Regular rhythm, no rub or gallop  Abdomen:  Soft, nontender  Extremities:  2-3+ pitting edema up to thighs  Neurologic:  Alert, oriented  Skin:  No acute rashes    Lab results: Basic Metabolic Panel: Recent Labs  Lab 01/21/19 1947  NA 144  K 4.8  CL 108  CO2 26  GLUCOSE 118*  BUN 61*  CREATININE 3.12*  CALCIUM 9.2    Liver Function Tests: No results for input(s): AST, ALT, ALKPHOS, BILITOT, PROT, ALBUMIN in the last 168 hours. No results for input(s): LIPASE, AMYLASE in the last 168 hours. No results for input(s): AMMONIA in the last 168 hours.  CBC: Recent Labs  Lab 01/21/19 1947 01/22/19 0741  WBC 6.2 4.7  NEUTROABS  --  3.3  HGB 10.5* 10.0*  HCT 33.3* 31.9*  MCV 95.4 94.4  PLT 226 240    Cardiac Enzymes: No results for input(s): CKTOTAL, TROPONINI in the last 168 hours.  BNP: Invalid input(s): POCBNP  CBG: Recent Labs  Lab 01/22/19 0755  GLUCAP 120*    Microbiology: Recent Results (from the past 720 hour(s))  SARS CORONAVIRUS 2 (TAT 6-24  HRS) Nasopharyngeal Nasopharyngeal Swab     Status: None   Collection Time: 01/03/19  9:07 AM   Specimen: Nasopharyngeal Swab  Result Value Ref Range Status   SARS Coronavirus 2 NEGATIVE NEGATIVE Final    Comment: (NOTE) SARS-CoV-2 target nucleic acids are NOT DETECTED. The SARS-CoV-2 RNA is generally detectable in upper and lower respiratory specimens during the acute phase of infection. Negative results do not preclude SARS-CoV-2 infection, do not rule out co-infections with other pathogens, and should not be used as the sole basis for treatment or other patient management decisions. Negative results must be combined with clinical observations, patient history, and epidemiological information. The expected result is Negative. Fact Sheet for Patients: SugarRoll.be Fact Sheet for Healthcare Providers: https://www.woods-mathews.com/ This test is not yet approved or cleared by the [place] FDA and  has been authorized for detection and/or diagnosis of SARS-CoV-2 by FDA under an Emergency Use Authorization (EUA). This EUA will remain  in effect (meaning this test can be used) for the duration of the COVID-19 declaration under Section 56 4(b)(1) of the Act, 21 U.S.C. section 360bbb-3(b)(1), unless the authorization is terminated or revoked sooner. Performed at [facility], [place] [address] [place], [place] [address]   Respiratory Panel  by RT PCR (Flu A&B, Covid) - Nasopharyngeal Swab     Status: None   Collection Time: 01/22/19  2:17 AM   Specimen: Nasopharyngeal Swab  Result Value Ref Range Status   SARS Coronavirus 2 by RT PCR NEGATIVE NEGATIVE Final    Comment: (NOTE) SARS-CoV-2 target nucleic acids are NOT DETECTED. The SARS-CoV-2 RNA is generally detectable in upper respiratoy specimens during the acute phase of infection. The lowest concentration of SARS-CoV-2 viral copies this assay can detect is 131 copies/mL. A negative  result does not preclude SARS-Cov-2 infection and should not be used as the sole basis for treatment or other patient management decisions. A negative result may occur with  improper specimen collection/handling, submission of specimen other than nasopharyngeal swab, presence of viral mutation(s) within the areas targeted by this assay, and inadequate number of viral copies (<131 copies/mL). A negative result must be combined with clinical observations, patient history, and epidemiological information. The expected result is Negative. Fact Sheet for Patients:  PinkCheek.be Fact Sheet for Healthcare Providers:  GravelBags.it This test is not yet ap proved or cleared by the [place] FDA and  has been authorized for detection and/or diagnosis of SARS-CoV-2 by FDA under an Emergency Use Authorization (EUA). This EUA will remain  in effect (meaning this test can be used) for the duration of the COVID-19 declaration under Section 564(b)(1) of the Act, 21 U.S.C. section 360bbb-3(b)(1), unless the authorization is terminated or revoked sooner.    Influenza A by PCR NEGATIVE NEGATIVE Final   Influenza B by PCR NEGATIVE NEGATIVE Final    Comment: (NOTE) The Xpert Xpress SARS-CoV-2/FLU/RSV assay is intended as an aid in  the diagnosis of influenza from Nasopharyngeal swab specimens and  should not be used as a sole basis for treatment. Nasal washings and  aspirates are unacceptable for Xpert Xpress SARS-CoV-2/FLU/RSV  testing. Fact Sheet for Patients: PinkCheek.be Fact Sheet for Healthcare Providers: GravelBags.it This test is not yet approved or cleared by the [place] FDA and  has been authorized for detection and/or diagnosis of SARS-CoV-2 by  FDA under an Emergency Use Authorization (EUA). This EUA will remain  in effect (meaning this test can be used) for the  duration of the  Covid-19 declaration under Section 564(b)(1) of the Act, 21  U.S.C. section 360bbb-3(b)(1), unless the authorization is  terminated or revoked. Performed at [facility], [address] [place], [place] [address]      Coagulation Studies: No results for input(s): LABPROT, INR in the last 72 hours.  Urinalysis: No results for input(s): COLORURINE, LABSPEC, PHURINE, GLUCOSEU, HGBUR, BILIRUBINUR, KETONESUR, PROTEINUR, UROBILINOGEN, NITRITE, LEUKOCYTESUR in the last 72 hours.  Invalid input(s): APPERANCEUR      Imaging: DG Chest 2 View  Result Date: 01/22/2019 CLINICAL DATA:  Shortness of breath and peripheral swelling EXAM: CHEST - 2 VIEW COMPARISON:  01/02/2019 FINDINGS: Cardiac shadow is enlarged but stable. Defibrillator is again noted and stable. The lungs are well aerated bilaterally. Previously seen atelectasis has resolved. Mild vascular congestion is noted without interstitial edema. Degenerative changes of the thoracic spine are noted. IMPRESSION: Mild vascular congestion without interstitial edema. Electronically Signed   By: Inez Catalina M.D.   On: 01/22/2019 00:12      Assessment & Plan: Pt is a 79 y.o. African-American  female with medical problems of diastolic CHF, DM-2, HTN , chronic kidney disease, pulmonary hypertension, history of pacemaker placement was admitted on 01/22/2019 with worsening LE edema.   1. ARF U/A  in dec neg for protein and blood Renal U/S with changes of cortical thinning and medical renal disease in 12/2018 Cause of ARF is Likely cardiorenal Due to underlying severe pulmonary hypertension, patient is likely very volume sensitive and aggressive diuresis may have led to prerenal state/ATN BNP likely not helpful given severe cardiac abnormalities Currently getting furosemide 40 mg IV twice a day Monitor serum creatinine closely.  Patient will likely maintain some lower extremity edema due to pulmonary hypertension No  acute indication for dialysis at present   2. CKD st 4. Baseline Cr 1.99/GFR 27 on12/30/2020 Patient is followed by nephrology/urology in [place]  3.  Lower extremity edema 2D echo from December 2020 shows LVEF 55 to 60% with borderline left ventricular hypertrophy.  It also shows grade 2 diastolic dysfunction Severely elevated pulmonary artery pressure were noted up to 103 mmHg  4.  Severe pulmonary hypertension Patient will benefit from pulmonary evaluation for obstructive sleep apnea May will need to follow-up with pulmonary hypertension clinic at [facility].       LOS: 0 Hafsah Hendler 1/16/202111:43 AM    Note: This note was prepared with Dragon dictation. Any transcription errors are unintentional

## 2019-01-22 NOTE — H&P (Signed)
[facility] [place] NAME: Michele Beck    MR#:  [id number]  DATE OF BIRTH:  [birth year]  DATE OF ADMISSION:  01/22/2019  PRIMARY CARE PHYSICIAN: System, Pcp Not In   REQUESTING/REFERRING PHYSICIAN: Lurline Hare, MD  CHIEF COMPLAINT:   Chief Complaint  Patient presents with  . Ankle Pain    HISTORY OF PRESENT ILLNESS:  Michele Beck  is a 79 y.o. female with a known history of diastolic CHF, type diabetes mellitus and hypertension, presented to emergency room with acute onset of worsening ankle, pedal and leg edema with subsequent associated pain giving her difficulty to ambulate.  She admits to recent dry cough and wheezing without significant dyspnea.  She has been having dyspnea on exertion.  No fever or chills.  No nausea or vomiting or abdominal pain.  No loss of taste or smell but states that the fluid has not been tasting right.  She has normal appetite and no body aches or generalized weakness and fatigue. Admitted here on 01/02/2019 for treatment of acute on chronic diastolic CHF.  She had a 2D echo done that revealed an EF of 55 to 60% with grade 2 diastolic dysfunction, mild left atrial and right atrial dilatation.  Upon presentation to the emergency room, blood pressure was 144/60 with otherwise normal vital signs.  Labs revealed a BNP of 1199 and high-sensitivity troponin I of 18 with mild anemia and a BUN of 61 with a creatinine of 3.12 compared to 37 and 1.99 on 01/05/2019 with stage IV chronic kidney disease.  Influenza A and B antigens as well as COVID-19 rapid antigen test came back negative.  Chest x-ray showed mild vascular congestion with interstitial pulmonary edema. EKG showed normal sinus rhythm with rate of 71 with occasional PVCs.  The patient was given 1 mg of IV Bumex.  She will be admitted to telemetry observation bed for further evaluation and management. PAST MEDICAL HISTORY:   Past Medical History:  Diagnosis Date  . Arthritis    . CHF (congestive heart failure) ([place])   . Diabetes mellitus without complication ([place])   . Hypertension   . Renal disorder     PAST SURGICAL HISTORY:   Past Surgical History:  Procedure Laterality Date  . BACK SURGERY    . PACEMAKER PLACEMENT    . TUBAL LIGATION      SOCIAL HISTORY:   Social History   Tobacco Use  . Smoking status: Never Smoker  . Smokeless tobacco: Never Used  Substance Use Topics  . Alcohol use: Never    FAMILY HISTORY:  No family history on file.  DRUG ALLERGIES:  No Known Allergies  REVIEW OF SYSTEMS:   ROS As per history of present illness. All pertinent systems were reviewed above. Constitutional,  HEENT, cardiovascular, respiratory, GI, GU, musculoskeletal, neuro, psychiatric, endocrine,  integumentary and hematologic systems were reviewed and are otherwise  negative/unremarkable except for positive findings mentioned above in the HPI.   MEDICATIONS AT HOME:   Prior to Admission medications   Medication Sig Start Date End Date Taking? Authorizing Provider  allopurinol (ZYLOPRIM) 100 MG tablet Take 100 mg by mouth daily. 12/07/18   [provider]  amLODipine (NORVASC) 5 MG tablet Take 5 mg by mouth daily. 11/02/18   [provider]  aspirin 81 MG chewable tablet Chew 81 mg by mouth daily.    [provider]  atorvastatin (LIPITOR) 20 MG tablet Take 20 mg by mouth daily. 12/27/18  [provider]  ferrous sulfate 325 (65 FE) MG tablet Take 325 mg by mouth 2 (two) times daily. 10/04/18   [provider]  furosemide (LASIX) 40 MG tablet Take 40 mg by mouth daily. 12/20/18   [provider]  hydrALAZINE (APRESOLINE) 100 MG tablet Take 100 mg by mouth 3 (three) times daily. 10/04/18   [provider]  LEVEMIR FLEXTOUCH 100 UNIT/ML Pen SMARTSIG:6 Unit(s) SUB-Q Every Night 07/20/18   [provider]  metoprolol tartrate (LOPRESSOR) 100 MG tablet Take 100 mg by mouth 2 (two)  times daily. 12/27/18   [provider]      VITAL SIGNS:  Blood pressure (!) 153/71, pulse 72, temperature 98.5 F (36.9 C), temperature source Oral, resp. rate 18, height 5\' 8"  (1.727 m), weight 99.8 kg, SpO2 94 %.  PHYSICAL EXAMINATION:  Physical Exam  GENERAL:  79 y.o.-year-old female patient lying in the bed with minimal respiratory distress with conversational dyspnea.Marland Kitchen  EYES: Pupils equal, round, reactive to light and accommodation. No scleral icterus. Extraocular muscles intact.  HEENT: Head atraumatic, normocephalic. Oropharynx and nasopharynx clear.  NECK:  Supple, no jugular venous distention. No thyroid enlargement, no tenderness.  LUNGS: Diminished bibasilar breath sounds with bibasal rales CARDIOVASCULAR: Regular rate and rhythm, S1, S2 normal. No murmurs, rubs, or gallops.  ABDOMEN: Soft, nondistended, nontender. Bowel sounds present. No organomegaly or mass.  EXTREMITIES: 1-2+ bilateral lower extremity pitting edema, with no cyanosis, or clubbing.  NEUROLOGIC: Cranial nerves II through XII are intact. Muscle strength 5/5 in all extremities. Sensation intact. Gait not checked.  PSYCHIATRIC: The patient is alert and oriented x 3.  Normal affect and good eye contact. SKIN: No obvious rash, lesion, or ulcer.   LABORATORY PANEL:   CBC Recent Labs  Lab 01/21/19 1947  WBC 6.2  HGB 10.5*  HCT 33.3*  PLT 226   ------------------------------------------------------------------------------------------------------------------  Chemistries  Recent Labs  Lab 01/21/19 1947  NA 144  K 4.8  CL 108  CO2 26  GLUCOSE 118*  BUN 61*  CREATININE 3.12*  CALCIUM 9.2   ------------------------------------------------------------------------------------------------------------------  Cardiac Enzymes No results for input(s): TROPONINI in the last 168  hours. ------------------------------------------------------------------------------------------------------------------  RADIOLOGY:  DG Chest 2 View  Result Date: 01/22/2019 CLINICAL DATA:  Shortness of breath and peripheral swelling EXAM: CHEST - 2 VIEW COMPARISON:  01/02/2019 FINDINGS: Cardiac shadow is enlarged but stable. Defibrillator is again noted and stable. The lungs are well aerated bilaterally. Previously seen atelectasis has resolved. Mild vascular congestion is noted without interstitial edema. Degenerative changes of the thoracic spine are noted. IMPRESSION: Mild vascular congestion without interstitial edema. Electronically Signed   By: Inez Catalina M.D.   On: 01/22/2019 00:12      IMPRESSION AND PLAN:   1.  Acute on chronic diastolic CHF. -The patient will be admitted to a telemetry bed. -She will be diuresed with IV Lasix. -We will follow serial troponin I's. -We will obtain a cardiology consultation by [facility]. -I notified Dr. Acie Fredrickson about the patient.  2.  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 -The patient will be placed on supplement coverage with NovoLog and will continue her basal coverage.  3.  Acute kidney injury superimposed on stage IV chronic kidney disease. -The patient will be continued on IV Lasix diuresis and we will follow his BMP.  4. Hypertension. -We will continue hydralazine, Norvasc and Lopressor.  5.  Gout. -We will continue allopurinol.  6.  Dyslipidemia. -We will continue statin  therapy.  7.  Chronic anemia. -We will continue ferrous sulfate.  8.  DVT prophylaxis. -Subcutaneous Lovenox.   All the records are reviewed and case discussed with ED provider. The plan of care was discussed in details with the patient (and family). I answered all questions. The patient agreed to proceed with the above mentioned plan. Further management will depend upon hospital course.   CODE STATUS: Full code  TOTAL TIME  TAKING CARE OF THIS PATIENT: 55 minutes.    Christel Mormon M.D on 01/22/2019 at 3:58 AM  [facility]   From 7 PM-7 AM, contact night-coverage [email or web address]  CC: Primary care physician; System, Pcp Not In   Note: This dictation was prepared with Dragon dictation along with smaller phrase technology. Any transcriptional errors that result from this process are unintentional.

## 2019-01-22 NOTE — ED Provider Notes (Signed)
[facility] Emergency Department Provider Note   ____________________________________________   First MD Initiated Contact with Patient 01/22/19 0105     (approximate)  I have reviewed the triage vital signs and the nursing notes.   HISTORY  Chief Complaint Ankle Pain    HPI Michele Beck is a 79 y.o. female who presents to the ED from home with a chief complaint of bilateral ankle swelling.  Patient has a history of CHF, CKD stage IV, diabetes, hypertension and anemia.  Hospitalized 3 weeks ago for CHF exacerbation.  Was not discharged on diuretic.  Reports BLE swelling so much that now she cannot drive because of the edema.  Endorses dyspnea on exertion.  Denies fever, cough, chest pain, abdominal pain, nausea, vomiting or diarrhea.      Past Medical History:  Diagnosis Date  . Arthritis   . CHF (congestive heart failure) ([place])   . Diabetes mellitus without complication ([place])   . Hypertension   . Renal disorder     Patient Active Problem List   Diagnosis Date Noted  . Essential hypertension   . Chronic renal impairment   . Type 2 diabetes mellitus with stage 4 chronic kidney disease, without long-term current use of insulin ([place])   . Acute on chronic congestive heart failure ([place]) 01/02/2019    Past Surgical History:  Procedure Laterality Date  . BACK SURGERY    . PACEMAKER PLACEMENT    . TUBAL LIGATION      Prior to Admission medications   Medication Sig Start Date End Date Taking? Authorizing Provider  allopurinol (ZYLOPRIM) 100 MG tablet Take 100 mg by mouth daily. 12/07/18   [provider]  amLODipine (NORVASC) 5 MG tablet Take 5 mg by mouth daily. 11/02/18   [provider]  aspirin 81 MG chewable tablet Chew 81 mg by mouth daily.    [provider]  atorvastatin (LIPITOR) 20 MG tablet Take 20 mg by mouth daily. 12/27/18   [provider]  ferrous sulfate 325 (65 FE) MG tablet Take 325 mg by  mouth 2 (two) times daily. 10/04/18   [provider]  furosemide (LASIX) 40 MG tablet Take 40 mg by mouth daily. 12/20/18   [provider]  hydrALAZINE (APRESOLINE) 100 MG tablet Take 100 mg by mouth 3 (three) times daily. 10/04/18   [provider]  LEVEMIR FLEXTOUCH 100 UNIT/ML Pen SMARTSIG:6 Unit(s) SUB-Q Every Night 07/20/18   [provider]  metoprolol tartrate (LOPRESSOR) 100 MG tablet Take 100 mg by mouth 2 (two) times daily. 12/27/18   [provider]    Allergies Patient has no known allergies.  No family history on file.  Social History Social History   Tobacco Use  . Smoking status: Never Smoker  . Smokeless tobacco: Never Used  Substance Use Topics  . Alcohol use: Never  . Drug use: Never    Review of Systems  Constitutional: No fever/chills Eyes: No visual changes. ENT: No sore throat. Cardiovascular: Denies chest pain. Respiratory: Positive for DOE. Gastrointestinal: No abdominal pain.  No nausea, no vomiting.  No diarrhea.  No constipation. Genitourinary: Negative for dysuria. Musculoskeletal: Positive for BLE edema.  Negative for back pain. Skin: Negative for rash. Neurological: Negative for headaches, focal weakness or numbness.   ____________________________________________   PHYSICAL EXAM:  VITAL SIGNS: ED Triage Vitals  Enc Vitals Group     BP 01/21/19 1909 (!) 144/60     Pulse Rate 01/21/19 1909 66  Resp 01/21/19 1909 18     Temp 01/21/19 1909 98.5 F (36.9 C)     Temp Source 01/21/19 1909 Oral     SpO2 01/21/19 1909 97 %     Weight 01/21/19 1913 220 lb (99.8 kg)     Height 01/21/19 1913 5\' 8"  (1.727 m)     Head Circumference --      Peak Flow --      Pain Score 01/21/19 1913 0     Pain Loc --      Pain Edu? --      Excl. in [place]? --     Constitutional: Alert and oriented. Well appearing and in mild acute distress. Eyes: Conjunctivae are normal. PERRL. EOMI. Head: Atraumatic. Nose: No  congestion/rhinnorhea. Mouth/Throat: Mucous membranes are moist.  Oropharynx non-erythematous. Neck: No stridor.   Cardiovascular: Normal rate, regular rhythm. Grossly normal heart sounds.  Good peripheral circulation. Respiratory: Normal respiratory effort.  No retractions. Lungs CTAB. Gastrointestinal: Soft and nontender. No distention. No abdominal bruits. No CVA tenderness. Musculoskeletal: No lower extremity tenderness. 3+ BLE pitting edema.  No joint effusions. Neurologic:  Normal speech and language. No gross focal neurologic deficits are appreciated. No gait instability. Skin:  Skin is warm, dry and intact. No rash noted. Psychiatric: Mood and affect are normal. Speech and behavior are normal.  ____________________________________________   LABS (all labs ordered are listed, but only abnormal results are displayed)  Labs Reviewed  BASIC METABOLIC PANEL - Abnormal; Notable for the following components:      Result Value   Glucose, Bld 118 (*)    BUN 61 (*)    Creatinine, Ser 3.12 (*)    GFR calc non Af Amer 14 (*)    GFR calc Af Amer 16 (*)    All other components within normal limits  CBC - Abnormal; Notable for the following components:   RBC 3.49 (*)    Hemoglobin 10.5 (*)    HCT 33.3 (*)    RDW 17.3 (*)    All other components within normal limits  BRAIN NATRIURETIC PEPTIDE - Abnormal; Notable for the following components:   B Natriuretic Peptide 1,199.0 (*)    All other components within normal limits  RESPIRATORY PANEL BY RT PCR (FLU A&B, COVID)  TROPONIN I (HIGH SENSITIVITY)  TROPONIN I (HIGH SENSITIVITY)   ____________________________________________  EKG  ED ECG REPORT I, Teneka Malmberg J, the attending physician, personally viewed and interpreted this ECG.   Date: 01/22/2019  EKG Time: 1931  Rate: 71  Rhythm: normal EKG, normal sinus rhythm  Axis: Normal  Intervals:none  ST&T Change:  Nonspecific  ____________________________________________  RADIOLOGY  ED MD interpretation: Pulmonary vascular congestion  Official radiology report(s): DG Chest 2 View  Result Date: 01/22/2019 CLINICAL DATA:  Shortness of breath and peripheral swelling EXAM: CHEST - 2 VIEW COMPARISON:  01/02/2019 FINDINGS: Cardiac shadow is enlarged but stable. Defibrillator is again noted and stable. The lungs are well aerated bilaterally. Previously seen atelectasis has resolved. Mild vascular congestion is noted without interstitial edema. Degenerative changes of the thoracic spine are noted. IMPRESSION: Mild vascular congestion without interstitial edema. Electronically Signed   By: Inez Catalina M.D.   On: 01/22/2019 00:12    ____________________________________________   PROCEDURES  Procedure(s) performed (including Critical Care):  Procedures   ____________________________________________   INITIAL IMPRESSION / ASSESSMENT AND PLAN / ED COURSE  As part of my medical decision making, I reviewed the following data within the [facility] notes  reviewed and incorporated, Labs reviewed, EKG interpreted, Old chart reviewed, Radiograph reviewed, Discussed with admitting physician and Notes from prior ED visits     Riyanna Radka was evaluated in Emergency Department on 01/22/2019 for the symptoms described in the history of present illness. She was evaluated in the context of the global COVID-19 pandemic, which necessitated consideration that the patient might be at risk for infection with the SARS-CoV-2 virus that causes COVID-19. Institutional protocols and algorithms that pertain to the evaluation of patients at risk for COVID-19 are in a state of rapid change based on information released by regulatory bodies including the CDC and federal and state organizations. These policies and algorithms were followed during the patient's care in the ED.    79 year old female with  CHF who presents with BLE pitting edema with difficulty ambulating secondary to edema. Differential includes, but is not limited to, viral syndrome, bronchitis including COPD exacerbation, pneumonia, reactive airway disease including asthma, CHF including exacerbation with or without pulmonary/interstitial edema, pneumothorax, ACS, thoracic trauma, and pulmonary embolism.  Laboratory results notable for worsening AKI compared to patient's baseline creatinine of 2.1.  Will administer 1 mg Bumex and discussed with hospital services for admission.      ____________________________________________   FINAL CLINICAL IMPRESSION(S) / ED DIAGNOSES  Final diagnoses:  Acute on chronic congestive heart failure, unspecified heart failure type ([place])  Acute renal failure, unspecified acute renal failure type [facility])  Peripheral edema     ED Discharge Orders    None       Note:  This document was prepared using Dragon voice recognition software and may include unintentional dictation errors.   Paulette Blanch, MD 01/22/19 [phone]

## 2019-01-22 NOTE — ED Notes (Signed)
Called lab to request trop add on

## 2019-01-23 DIAGNOSIS — N179 Acute kidney failure, unspecified: Secondary | ICD-10-CM | POA: Diagnosis not present

## 2019-01-23 DIAGNOSIS — I10 Essential (primary) hypertension: Secondary | ICD-10-CM | POA: Diagnosis not present

## 2019-01-23 DIAGNOSIS — I5033 Acute on chronic diastolic (congestive) heart failure: Secondary | ICD-10-CM | POA: Diagnosis not present

## 2019-01-23 DIAGNOSIS — R609 Edema, unspecified: Secondary | ICD-10-CM | POA: Diagnosis not present

## 2019-01-23 DIAGNOSIS — I272 Pulmonary hypertension, unspecified: Secondary | ICD-10-CM | POA: Diagnosis not present

## 2019-01-23 DIAGNOSIS — I509 Heart failure, unspecified: Secondary | ICD-10-CM | POA: Diagnosis not present

## 2019-01-23 LAB — GLUCOSE, CAPILLARY
Glucose-Capillary: 97 mg/dL (ref 70–99)
Glucose-Capillary: 99 mg/dL (ref 70–99)

## 2019-01-23 LAB — BASIC METABOLIC PANEL
Anion gap: 11 (ref 5–15)
BUN: 52 mg/dL — ABNORMAL HIGH (ref 8–23)
CO2: 26 mmol/L (ref 22–32)
Calcium: 8.8 mg/dL — ABNORMAL LOW (ref 8.9–10.3)
Chloride: 104 mmol/L (ref 98–111)
Creatinine, Ser: 2.56 mg/dL — ABNORMAL HIGH (ref 0.44–1.00)
GFR calc Af Amer: 20 mL/min — ABNORMAL LOW (ref >60)
GFR calc non Af Amer: 17 mL/min — ABNORMAL LOW (ref >60)
Glucose, Bld: 102 mg/dL — ABNORMAL HIGH (ref 70–99)
Potassium: 4.7 mmol/L (ref 3.5–5.1)
Sodium: 141 mmol/L (ref 135–145)

## 2019-01-23 LAB — BRAIN NATRIURETIC PEPTIDE: B Natriuretic Peptide: 1499 pg/mL — ABNORMAL HIGH (ref 0.0–100.0)

## 2019-01-23 MED ORDER — TORSEMIDE 20 MG PO TABS: 40.0000 mg | ORAL_TABLET | Freq: Every day | ORAL | 0 refills | Status: AC

## 2019-01-23 MED ORDER — TORSEMIDE 20 MG PO TABS: 40.0000 mg | ORAL_TABLET | Freq: Every day | ORAL | Status: DC

## 2019-01-23 MED ORDER — TORSEMIDE 20 MG PO TABS: 40.0000 mg | ORAL_TABLET | Freq: Every day | ORAL | 0 refills | Status: DC

## 2019-01-23 NOTE — Discharge Summary (Signed)
Physician Discharge Summary  Patient ID: Michele Beck MRN: [id number] DOB/AGE: 1940-12-14 79 y.o.  Admit date: 01/22/2019 Discharge date: 01/23/2019  Admission Diagnoses:  Discharge Diagnoses:  Active Problems:   Acute CHF (congestive heart failure) (HCC)   Peripheral edema   Pulmonary HTN (H[facility])   Acute on chronic diastolic heart failure [facility])   Discharged Condition:Fair   Hospital Course:  Michele Beck  is a 79 y.o. female with a known history of diastolic CHF, type diabetes mellitus and hypertension, presented to emergency room with acute onset of worsening ankle, pedal and leg edema with subsequent associated pain giving her difficulty to ambulate.  Admitted here on 01/02/2019 for treatment of acute on chronic diastolic CHF.  She had a 2D echo done that revealed an EF of 55 to 60% with grade 2 diastolic dysfunction, mild left atrial and right atrial dilatation.  Influenza A and B antigens as well as COVID-19 rapid antigen test came back negative.  Chest x-ray showed mild vascular congestion with interstitial pulmonary edema. EKG showed normal sinus rhythm with rate of 71 with occasional PVCs. The patient was given 1 mg of IV Bumex.  She will be admitted to telemetry observation bed for further evaluation and management.  1.  Acute on chronic diastolic CHF with chronic pulmonary hypertension -Status post IV diuresis with Lasix.  Lost about 6 pound over the past 24 hours.  Negative be balanced. -Neurology consulted.  Patient is to be started on torsemide p.o. and continue that on discharge -No shortness of breath or dyspnea. -Patient was referred to heart failure clinic by cardiology -Unknown etiology for pulmonary hypertension.  Advised to follow-up with heart failure clinic and she may need right heart cath at some point and a pulmonary hypertension specialist.  Strongly advised to see her PCP as soon as possible for referral if needed. -He needs to continue amlodipine 5 mg daily  along with torsemide instead of Lasix.  Patient may be less responsive or resistant to Lasix.  2.  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  Stable -May resume home regimen  3.  Acute kidney injury superimposed on stage IV chronic kidney disease. -Creatinine improved -Nephrology consulted -Commended to change to torsemide from furosemide -Cleared for discharge with close follow-up with PCP  4. Hypertension.  Stable -Resume home amlodipine, Lopressor and continue torsemide -Stop Lasix  5.  Gout. -Continue allopurinol  6.  Dyslipidemia. -Continue home med  7.  Chronic anemia. -Continue ferrous sulfate.  Consults: cardiology and nephrology  Significant Diagnostic Studies: Radiologic images and blood work  Treatments: Hospital course and discharge med list  Discharge Exam: Blood pressure 139/67, pulse 73, temperature 97.9 F (36.6 C), temperature source Oral, resp. rate 18, height 5\' 5"  (1.651 m), weight 103.6 kg, SpO2 97 %.  GENERAL: Relaxed to go home; no obvious distress EYES: Pupils equal, round, reactive to light and accommodation. No scleral icterus. Extraocular muscles intact.  HEENT: Head atraumatic, normocephalic. Oropharynx and nasopharynx clear.  NECK:  Supple, no jugular venous distention. No thyroid enlargement, no tenderness.  LUNGS:  Mostly clear to auscultation bilaterally CARDIOVASCULAR: Regular rate and rhythm, S1, S2 normal. No murmurs, rubs, or gallops.  ABDOMEN: Soft, nondistended, nontender. Bowel sounds present. No organomegaly or mass.  EXTREMITIES:  Trace edema, with no cyanosis, or clubbing.  NEUROLOGIC: Cranial nerves II through XII are intact. Muscle strength 5/5 in all extremities. Sensation intact. Gait not checked.  PSYCHIATRIC: The patient is alert and oriented x 3.  Normal  affect and good eye contact. SKIN: No obvious rash, lesion, or ulcer.   Disposition: Discharge disposition: 01-Home or Self  Care     Stable to be discharged home with close follow-up with PCP and cardiology as referred by the cardiologist.  Also advised to adhere to treatment plan. -Warning signs and symptoms explained to patient's when she must seek immediate medical attention.  Patient expressed understanding.  Discharge Instructions    Call MD for:  difficulty breathing, headache or visual disturbances   Complete by: As directed    Diet - low sodium heart healthy   Complete by: As directed    Increase activity slowly   Complete by: As directed      Allergies as of 01/23/2019   No Known Allergies     Medication List    STOP taking these medications   furosemide 40 MG tablet Commonly known as: LASIX     TAKE these medications   allopurinol 100 MG tablet Commonly known as: ZYLOPRIM Take 100 mg by mouth daily.   amLODipine 5 MG tablet Commonly known as: NORVASC Take 5 mg by mouth daily.   aspirin 81 MG chewable tablet Chew 81 mg by mouth daily.   atorvastatin 20 MG tablet Commonly known as: LIPITOR Take 20 mg by mouth daily.   ferrous sulfate 325 (65 FE) MG tablet Take 325 mg by mouth 2 (two) times daily.   hydrALAZINE 100 MG tablet Commonly known as: APRESOLINE Take 100 mg by mouth 3 (three) times daily.   Levemir FlexTouch 100 UNIT/ML Pen Generic drug: Insulin Detemir SMARTSIG:6 Unit(s) SUB-Q Every Night   metoprolol tartrate 100 MG tablet Commonly known as: LOPRESSOR Take 100 mg by mouth 2 (two) times daily.   torsemide 20 MG tablet Commonly known as: DEMADEX Take 2 tablets (40 mg total) by mouth daily.      Follow-up Information    [facility] Follow up on 02/02/2019.   Specialty: Cardiology Why: at 11:00am. Enter through the [place] entrance Contact information: [address] [place] [phone]          Signed: Thornell Mule 01/23/2019, 4:08 PM

## 2019-01-23 NOTE — Consult Note (Signed)
Cardiology Consultation:   Patient ID: Michele Beck MRN: [id number]; DOB: 05-30-1940  Admit date: 01/22/2019 Date of Consult: 01/23/2019  Primary Care Provider: System, Pcp Not In Primary Cardiologist: primary cardiologist is in [place], [place] Primary Electrophysiologist:  None    Patient Profile:   Michele Beck is a 78 y.o. female with a hx of hypertension, CKD stage III, heart failure preserved ejection fraction who is being seen today for the evaluation of edema at the request of Dr. Izetta Dakin.  History of Present Illness:   [name] is a 79 year old female with history of hypertension, CKD stage III,HFpEF, pulmonary hypertension who presents with worsening lower extremity edema.  Patient lives in [place], follows up with cardiology and nephrologist there.  She he is visiting son in the area.  She states having worsening lower extremity edema leading to difficulty with ambulation.  She denies any shortness of breath.  Upon presentation, chest x-ray showed mild vascular congestion.  Patient has been diuresed with Lasix significant improvement in edema.  Echocardiogram showed grade 2 diastolic dysfunction, preserved ejection fraction, severe pulmonary hypertension.  Upon my exam today, she states feeling much better compared to admission.  She is planning to be discharged today and planning to follow-up with physicians in [place].  Heart Pathway Score:     Past Medical History:  Diagnosis Date  . Arthritis   . CHF (congestive heart failure) ([place])   . Diabetes mellitus without complication ([place])   . Hypertension   . Renal disorder     Past Surgical History:  Procedure Laterality Date  . BACK SURGERY    . PACEMAKER PLACEMENT    . TUBAL LIGATION       Home Medications:  Prior to Admission medications   Medication Sig Start Date End Date Taking? Authorizing Provider  allopurinol (ZYLOPRIM) 100 MG tablet Take 100 mg by mouth daily. 12/07/18  Yes [provider]  amLODipine (NORVASC) 5 MG tablet Take 5 mg by mouth daily. 11/02/18  Yes [provider]  aspirin 81 MG chewable tablet Chew 81 mg by mouth daily.   Yes [provider]  atorvastatin (LIPITOR) 20 MG tablet Take 20 mg by mouth daily. 12/27/18  Yes [provider]  ferrous sulfate 325 (65 FE) MG tablet Take 325 mg by mouth 2 (two) times daily. 10/04/18  Yes [provider]  furosemide (LASIX) 40 MG tablet Take 40 mg by mouth daily. 12/20/18  Yes [provider]  hydrALAZINE (APRESOLINE) 100 MG tablet Take 100 mg by mouth 3 (three) times daily. 10/04/18  Yes [provider]  LEVEMIR FLEXTOUCH 100 UNIT/ML Pen SMARTSIG:6 Unit(s) SUB-Q Every Night 07/20/18  Yes [provider]  metoprolol tartrate (LOPRESSOR) 100 MG tablet Take 100 mg by mouth 2 (two) times daily. 12/27/18  Yes [provider]  torsemide (DEMADEX) 20 MG tablet Take 2 tablets (40 mg total) by mouth daily. 01/23/19   Thornell Mule, MD    Inpatient Medications: Scheduled Meds: . allopurinol  50 mg Oral Daily  . amLODipine  5 mg Oral Daily  . aspirin  81 mg Oral Daily  . atorvastatin  20 mg Oral Daily  . ferrous sulfate  325 mg Oral BID  . heparin injection (subcutaneous)  5,000 Units Subcutaneous Q8H  . hydrALAZINE  100 mg Oral TID  . insulin aspart  0-9 Units Subcutaneous TID WC  . insulin detemir  4 Units Subcutaneous Q2200  . metoprolol tartrate  100 mg Oral BID  . sodium  chloride flush  3 mL Intravenous Q12H  . torsemide  40 mg Oral Daily   Continuous Infusions: . sodium chloride     PRN Meds: sodium chloride, acetaminophen, ondansetron (ZOFRAN) IV, sodium chloride flush  Allergies:   No Known Allergies  Social History:   Social History   Socioeconomic History  . Marital status: Single    Spouse name: Not on file  . Number of children: Not on file  . Years of education: Not on file  . Highest education level: Not on file    Occupational History  . Not on file  Tobacco Use  . Smoking status: Never Smoker  . Smokeless tobacco: Never Used  Substance and Sexual Activity  . Alcohol use: Never  . Drug use: Never  . Sexual activity: Not on file  Other Topics Concern  . Not on file  Social History Narrative  . Not on file   Social Determinants of Health   Financial Resource Strain:   . Difficulty of Paying Living Expenses: Not on file  Food Insecurity:   . Worried About Charity fundraiser in the Last Year: Not on file  . Ran Out of Food in the Last Year: Not on file  Transportation Needs:   . Lack of Transportation (Medical): Not on file  . Lack of Transportation (Non-Medical): Not on file  Physical Activity:   . Days of Exercise per Week: Not on file  . Minutes of Exercise per Session: Not on file  Stress:   . Feeling of Stress : Not on file  Social Connections:   . Frequency of Communication with Friends and Family: Not on file  . Frequency of Social Gatherings with Friends and Family: Not on file  . Attends Religious Services: Not on file  . Active Member of Clubs or Organizations: Not on file  . Attends Archivist Meetings: Not on file  . Marital Status: Not on file  Intimate Partner Violence:   . Fear of Current or Ex-Partner: Not on file  . Emotionally Abused: Not on file  . Physically Abused: Not on file  . Sexually Abused: Not on file    Family History:   History reviewed. No pertinent family history.   ROS:  Please see the history of present illness.   All other ROS reviewed and negative.     Physical Exam/Data:   Vitals:   01/22/19 1610 01/22/19 1951 01/23/19 0501 01/23/19 0755  BP: (!) 110/57 (!) 128/56 (!) 118/53 139/67  Pulse: 69 73 71 73  Resp: 18 17 16 18   Temp: 98 F (36.7 C) 97.8 F (36.6 C) 98.1 F (36.7 C) 97.9 F (36.6 C)  TempSrc: Oral Oral Oral Oral  SpO2: 97% 98% 96% 97%  Weight:   103.6 kg   Height:        Intake/Output Summary (Last 24  hours) at 01/23/2019 1138 Last data filed at 01/23/2019 0753 Gross per 24 hour  Intake --  Output 0 ml  Net 0 ml   Last 3 Weights 01/23/2019 01/22/2019 01/21/2019  Weight (lbs) 228 lb 6.4 oz 234 lb 3.2 oz 220 lb  Weight (kg) 103.602 kg 106.232 kg 99.791 kg     Body mass index is 38.01 kg/m.  General:  Well nourished, well developed, in no acute distress, obese HEENT: normal Lymph: no adenopathy Neck: no JVD Endocrine:  No thryomegaly Vascular: No carotid bruits; FA pulses 2+ bilaterally without bruits  Cardiac:  normal S1, S2; RRR; no  murmur  Lungs:  clear to auscultation bilaterally, no wheezing, rhonchi or rales  Abd: soft, nontender, no hepatomegaly  Ext: Trace to 1+ edema Musculoskeletal:  No deformities, BUE and BLE strength normal and equal Skin: warm and dry  Neuro:  CNs 2-12 intact, no focal abnormalities noted Psych:  Normal affect   EKG:  The EKG was personally reviewed and demonstrates: Sinus rhythm with occasional PVCs. Telemetry:  Telemetry was personally reviewed and demonstrates: Sinus rhythm  Relevant CV Studies: TTE 01/03/2019  1. Left ventricular ejection fraction, by visual estimation, is 55 to 60%. The left ventricle has normal function. There is borderline left ventricular hypertrophy.  2. Left ventricular diastolic parameters are consistent with Grade II diastolic dysfunction (pseudonormalization).  3. The left ventricle has no regional wall motion abnormalities.  4. Global right ventricle has normal systolic function.The right ventricular size is normal. No increase in right ventricular wall thickness.  5. Left atrial size was mildly dilated.  6. Right atrial size was mildly dilated.  7. The pericardium was not well visualized.  8. The mitral valve is normal in structure. Trivial mitral valve regurgitation. No evidence of mitral stenosis.  9. The tricuspid valve is not well visualized. 10. The aortic valve is tricuspid. Aortic valve regurgitation is mild.  Mild to moderate aortic valve sclerosis/calcification without any evidence of aortic stenosis. 11. Irregular thickening primarily involving the noncoronary leaflet most likely represents sclerosis though vegetation cannot be excluded. 12. The pulmonic valve was not well visualized. Pulmonic valve regurgitation is trivial. 13. Severely elevated pulmonary artery systolic pressure. 14. A pacer wire is visualized in the RV and RA. 15. The inferior vena cava is normal in size with greater than 50% respiratory variability, suggesting right atrial pressure of 3 mmHg. 16. The interatrial septum was not well visualized.  Laboratory Data:  High Sensitivity Troponin:   Recent Labs  Lab 01/21/19 1947 01/22/19 0742  TROPONINIHS 18* 21*     Chemistry Recent Labs  Lab 01/21/19 1947 01/23/19 0510  NA 144 141  K 4.8 4.7  CL 108 104  CO2 26 26  GLUCOSE 118* 102*  BUN 61* 52*  CREATININE 3.12* 2.56*  CALCIUM 9.2 8.8*  GFRNONAA 14* 17*  GFRAA 16* 20*  ANIONGAP 10 11    No results for input(s): PROT, ALBUMIN, AST, ALT, ALKPHOS, BILITOT in the last 168 hours. Hematology Recent Labs  Lab 01/21/19 1947 01/22/19 0741  WBC 6.2 4.7  RBC 3.49* 3.38*  HGB 10.5* 10.0*  HCT 33.3* 31.9*  MCV 95.4 94.4  MCH 30.1 29.6  MCHC 31.5 31.3  RDW 17.3* 16.9*  PLT 226 240   BNP Recent Labs  Lab 01/21/19 1947 01/23/19 0510  BNP 1,199.0* 1,499.0*    DDimer No results for input(s): DDIMER in the last 168 hours.   Radiology/Studies:  DG Chest 2 View  Result Date: 01/22/2019 CLINICAL DATA:  Shortness of breath and peripheral swelling EXAM: CHEST - 2 VIEW COMPARISON:  01/02/2019 FINDINGS: Cardiac shadow is enlarged but stable. Defibrillator is again noted and stable. The lungs are well aerated bilaterally. Previously seen atelectasis has resolved. Mild vascular congestion is noted without interstitial edema. Degenerative changes of the thoracic spine are noted. IMPRESSION: Mild vascular congestion  without interstitial edema. Electronically Signed   By: Inez Catalina M.D.   On: 01/22/2019 00:12   {   Assessment and Plan:   1. HFpEF, grade II diastolic dysfunction -Agree with diuresing using torsemide.  Continue -About 6 pound weight loss over  the past 24 hours -Monitor creatinine  2.  Pulmonary hypertension, severe -Etiology unclear.  OSA/obesity likely contributing -Patient will need right heart cath at some point, and pulmonary hypertension specialist.  This can be done at her primary institution on outpatient basis -Diuresis as above -On calcium channel blocker/amlodipine 5 mg daily  3.  CKD stage 4 -Creatinine slightly improved with diuresing -Nephrology following -Appreciate input  4.  Hypertension -Blood pressure controlled -Continue hydralazine, amlodipine, beta-blocker   Signed, Kate Sable, MD  01/23/2019 11:38 AM

## 2019-01-23 NOTE — Progress Notes (Signed)
[place], [place] 01/23/19  Subjective:   Hospital day # 0  Doing well States her lower extremity edema appears to be improving Room air.  No shortness of breath Renal: No intake/output data recorded. Lab Results  Component Value Date   CREATININE 2.56 (H) 01/23/2019   CREATININE 3.12 (H) 01/21/2019   CREATININE 1.99 (H) 01/05/2019     Objective:  Vital signs in last 24 hours:  Temp:  [97.8 F (36.6 C)-98.1 F (36.7 C)] 97.9 F (36.6 C) (01/17 0755) Pulse Rate:  [69-73] 73 (01/17 0755) Resp:  [16-18] 18 (01/17 0755) BP: (110-139)/(53-67) 139/67 (01/17 0755) SpO2:  [96 %-98 %] 97 % (01/17 0755) Weight:  [103.6 kg] 103.6 kg (01/17 0501)  Weight change: 3.81 kg Filed Weights   01/21/19 1913 01/22/19 0458 01/23/19 0501  Weight: 99.8 kg 106.2 kg 103.6 kg    Intake/Output:    Intake/Output Summary (Last 24 hours) at 01/23/2019 1022 Last data filed at 01/23/2019 0753 Gross per 24 hour  Intake --  Output 0 ml  Net 0 ml    Physical Exam: General:  No acute distress, sitting up in the chair  HEENT  anicteric, moist oral mucous membranes  Lungs:  Normal breathing effort on room air, clear to auscultation  Heart::  Regular rhythm, no rub or gallop  Abdomen:  Soft, nontender  Extremities:  2-3+ pitting edema up to knees  Neurologic:  Alert, oriented  Skin:  No acute rashes      Basic Metabolic Panel:  Recent Labs  Lab 01/21/19 1947 01/23/19 0510  NA 144 141  K 4.8 4.7  CL 108 104  CO2 26 26  GLUCOSE 118* 102*  BUN 61* 52*  CREATININE 3.12* 2.56*  CALCIUM 9.2 8.8*     CBC: Recent Labs  Lab 01/21/19 1947 01/22/19 0741  WBC 6.2 4.7  NEUTROABS  --  3.3  HGB 10.5* 10.0*  HCT 33.3* 31.9*  MCV 95.4 94.4  PLT 226 240     No results found for: HEPBSAG, HEPBSAB, HEPBIGM    Microbiology:  Recent Results (from the past 240 hour(s))  Respiratory Panel by RT PCR (Flu A&B, Covid) - Nasopharyngeal Swab     Status: None    Collection Time: 01/22/19  2:17 AM   Specimen: Nasopharyngeal Swab  Result Value Ref Range Status   SARS Coronavirus 2 by RT PCR NEGATIVE NEGATIVE Final    Comment: (NOTE) SARS-CoV-2 target nucleic acids are NOT DETECTED. The SARS-CoV-2 RNA is generally detectable in upper respiratoy specimens during the acute phase of infection. The lowest concentration of SARS-CoV-2 viral copies this assay can detect is 131 copies/mL. A negative result does not preclude SARS-Cov-2 infection and should not be used as the sole basis for treatment or other patient management decisions. A negative result may occur with  improper specimen collection/handling, submission of specimen other than nasopharyngeal swab, presence of viral mutation(s) within the areas targeted by this assay, and inadequate number of viral copies (<131 copies/mL). A negative result must be combined with clinical observations, patient history, and epidemiological information. The expected result is Negative. Fact Sheet for Patients:  PinkCheek.be Fact Sheet for Healthcare Providers:  GravelBags.it This test is not yet ap proved or cleared by the [place] FDA and  has been authorized for detection and/or diagnosis of SARS-CoV-2 by FDA under an Emergency Use Authorization (EUA). This EUA will remain  in effect (meaning this test can be used) for the duration of the COVID-19  declaration under Section 564(b)(1) of the Act, 21 U.S.C. section 360bbb-3(b)(1), unless the authorization is terminated or revoked sooner.    Influenza A by PCR NEGATIVE NEGATIVE Final   Influenza B by PCR NEGATIVE NEGATIVE Final    Comment: (NOTE) The Xpert Xpress SARS-CoV-2/FLU/RSV assay is intended as an aid in  the diagnosis of influenza from Nasopharyngeal swab specimens and  should not be used as a sole basis for treatment. Nasal washings and  aspirates are unacceptable for Xpert Xpress  SARS-CoV-2/FLU/RSV  testing. Fact Sheet for Patients: PinkCheek.be Fact Sheet for Healthcare Providers: GravelBags.it This test is not yet approved or cleared by the [place] FDA and  has been authorized for detection and/or diagnosis of SARS-CoV-2 by  FDA under an Emergency Use Authorization (EUA). This EUA will remain  in effect (meaning this test can be used) for the duration of the  Covid-19 declaration under Section 564(b)(1) of the Act, 21  U.S.C. section 360bbb-3(b)(1), unless the authorization is  terminated or revoked. Performed at [facility], [address] [place], [place] [address]     Coagulation Studies: No results for input(s): LABPROT, INR in the last 72 hours.  Urinalysis: No results for input(s): COLORURINE, LABSPEC, PHURINE, GLUCOSEU, HGBUR, BILIRUBINUR, KETONESUR, PROTEINUR, UROBILINOGEN, NITRITE, LEUKOCYTESUR in the last 72 hours.  Invalid input(s): APPERANCEUR    Imaging: DG Chest 2 View  Result Date: 01/22/2019 CLINICAL DATA:  Shortness of breath and peripheral swelling EXAM: CHEST - 2 VIEW COMPARISON:  01/02/2019 FINDINGS: Cardiac shadow is enlarged but stable. Defibrillator is again noted and stable. The lungs are well aerated bilaterally. Previously seen atelectasis has resolved. Mild vascular congestion is noted without interstitial edema. Degenerative changes of the thoracic spine are noted. IMPRESSION: Mild vascular congestion without interstitial edema. Electronically Signed   By: Inez Catalina M.D.   On: 01/22/2019 00:12     Medications:   . sodium chloride     . allopurinol  50 mg Oral Daily  . amLODipine  5 mg Oral Daily  . aspirin  81 mg Oral Daily  . atorvastatin  20 mg Oral Daily  . ferrous sulfate  325 mg Oral BID  . furosemide  40 mg Intravenous Q12H  . heparin injection (subcutaneous)  5,000 Units Subcutaneous Q8H  . hydrALAZINE  100 mg Oral TID  . insulin  aspart  0-9 Units Subcutaneous TID WC  . insulin detemir  4 Units Subcutaneous Q2200  . metoprolol tartrate  100 mg Oral BID  . sodium chloride flush  3 mL Intravenous Q12H   sodium chloride, acetaminophen, ondansetron (ZOFRAN) IV, sodium chloride flush  Assessment/ Plan:  79 y.o. female with  medical problems of diastolic CHF, DM-2, HTN , chronic kidney disease, pulmonary hypertension, history of pacemaker placement  admitted on 01/22/2019 for Peripheral edema [R60.9] Acute CHF (congestive heart failure) ([place]) [I50.9] Acute renal failure, unspecified acute renal failure type ([place]) [N17.9] Acute on chronic congestive heart failure, unspecified heart failure type ([place]) [I50.9]   1. ARF U/A in dec neg for protein and blood Renal U/S with changes of cortical thinning and medical renal disease in 12/2018 Cause of ARF is Likely cardiorenal Due to underlying severe pulmonary hypertension, patient is likely very volume sensitive and aggressive diuresis may have led to prerenal state/ATN BNP likely not helpful given severe cardiac abnormalities May change iv furosemide to torsemide Monitor serum creatinine closely.  Patient will likely maintain some lower extremity edema due to pulmonary hypertension No acute indication for dialysis at  present   2. CKD st 4. Baseline Cr 1.99/GFR 27 on12/30/2020 Patient is followed by nephrology/urology in [place]  3.  Lower extremity edema 2D echo from December 2020 shows LVEF 55 to 60% with borderline left ventricular hypertrophy.  It also shows grade 2 diastolic dysfunction Severely elevated pulmonary artery pressure were noted up to 103 mmHg Consider torsemide 40 mg p.o. daily Counseled patient for salt restriction  4.  Severe pulmonary hypertension Patient will benefit from pulmonary evaluation to r/o obstructive sleep apnea May benefit from evaluation at pulmonary hypertension clinic at [facility].    LOS: 0 Shelley Cocke 1/17/202110:22  AM  [place], [place]  Note: This note was prepared with Dragon dictation. Any transcription errors are unintentional

## 2019-01-24 ENCOUNTER — Telehealth: Payer: Self-pay | Admitting: Family

## 2019-01-24 NOTE — Telephone Encounter (Signed)
Spoke with Patient to follow up since her recent hospital d/c. Patient states she is doing very well. She is checking her weight daily, taking her meds as prescribed, and following her low sodium diet. She is able to exercise some and has no complaints. She confirmed her new patient appt with the heart failure clinic we have schedule for 1/27.   Alyse Low, [place]

## 2019-02-01 NOTE — Progress Notes (Signed)
Patient ID: Michele Beck, female    DOB: 1940-09-13, 79 y.o.   MRN: [id number]  HPI  Michele Beck is a 79 y/o female with a history of DM, HTN, CKD, arthritis, pacemaker and chronic heart failure.   Echo report from 01/03/2019 reviewed and showed an EF of 55-60% along with mild AR and severely elevated PA pressure.   Admitted 01/22/19 due to acute on chronic heart failure. Cardiology and nephrology consults obtained. Initially needed IV lasix and then transitioned to oral diuretics. Discharged the following day.   She presents today for her initial visit with a chief complaint of minimal fatigue upon moderate exertion. She describes this as chronic in nature having been present for several months. She has associated cough, pedal edema (improving), scratchy throat and leg weakness along with this. She denies any difficulty sleeping, dizziness, abdominal distention, palpitations, shortness of breath, chest pain or weight gain.   Has scales but hasn't been weighing herself every day. Has recently started reading food labels for sodium content.   Lives in [place] but visits with her son who lives here in [place]. Has PCP and cardiology in [place].   Past Medical History:  Diagnosis Date  . Arthritis   . CHF (congestive heart failure) ([place])   . Diabetes mellitus without complication ([place])   . Hypertension   . Renal disorder    Past Surgical History:  Procedure Laterality Date  . BACK SURGERY    . PACEMAKER PLACEMENT    . TUBAL LIGATION     History reviewed. No pertinent family history. Social History   Tobacco Use  . Smoking status: Never Smoker  . Smokeless tobacco: Never Used  Substance Use Topics  . Alcohol use: Never   No Known Allergies Prior to Admission medications   Medication Sig Start Date End Date Taking? Authorizing Provider  allopurinol (ZYLOPRIM) 100 MG tablet Take 100 mg by mouth daily. 12/07/18  Yes [provider]  amLODipine (NORVASC) 5 MG tablet Take 5 mg  by mouth daily. 11/02/18  Yes [provider]  aspirin 81 MG chewable tablet Chew 81 mg by mouth daily.   Yes [provider]  atorvastatin (LIPITOR) 20 MG tablet Take 20 mg by mouth daily. 12/27/18  Yes [provider]  Cholecalciferol (VITAMIN D-3) 25 MCG (1000 UT) CAPS Take 2 capsules by mouth daily.   Yes [provider]  ferrous sulfate 325 (65 FE) MG tablet Take 325 mg by mouth 2 (two) times daily. 10/04/18  Yes [provider]  hydrALAZINE (APRESOLINE) 100 MG tablet Take 100 mg by mouth 3 (three) times daily. 10/04/18  Yes [provider]  LEVEMIR FLEXTOUCH 100 UNIT/ML Pen SMARTSIG:6 Unit(s) SUB-Q Every Night 07/20/18  Yes [provider]  metoprolol tartrate (LOPRESSOR) 100 MG tablet Take 100 mg by mouth 2 (two) times daily. 12/27/18  Yes [provider]  torsemide (DEMADEX) 20 MG tablet Take 2 tablets (40 mg total) by mouth daily. 01/23/19  Yes Thornell Mule, MD  vitamin B-12 (CYANOCOBALAMIN) 1000 MCG tablet Take 1,000 mcg by mouth daily.   Yes [provider]     Review of Systems  Constitutional: Positive for fatigue. Negative for appetite change.  HENT: Positive for sore throat (scratchy throat). Negative for congestion and rhinorrhea.   Eyes: Negative.   Respiratory: Positive for cough. Negative for chest tightness and shortness of breath.   Cardiovascular: Positive for leg swelling. Negative for chest pain and palpitations.  Gastrointestinal: Negative for abdominal distention and  abdominal pain.  Endocrine: Negative.   Genitourinary: Negative.   Musculoskeletal: Negative for back pain and neck pain.  Skin: Negative.   Allergic/Immunologic: Negative.   Neurological: Positive for weakness (legs). Negative for dizziness and light-headedness.  Hematological: Negative for adenopathy. Does not bruise/bleed easily.  Psychiatric/Behavioral: Negative for dysphoric mood and sleep disturbance (sleeping on 2  pillows). The patient is not nervous/anxious.     Vitals:   02/02/19 1216  BP: (!) 153/66  Pulse: 72  Resp: 16  SpO2: 93%  Weight: 231 lb 9.6 oz (105.1 kg)  Height: 5\' 6"  (1.676 m)   Wt Readings from Last 3 Encounters:  02/02/19 231 lb 9.6 oz (105.1 kg)  01/23/19 228 lb 6.4 oz (103.6 kg)  01/05/19 226 lb 9.6 oz (102.8 kg)   Lab Results  Component Value Date   CREATININE 2.56 (H) 01/23/2019   CREATININE 3.12 (H) 01/21/2019   CREATININE 1.99 (H) 01/05/2019     Physical Exam Vitals and nursing note reviewed.  Constitutional:      Appearance: Normal appearance.  HENT:     Head: Normocephalic and atraumatic.  Cardiovascular:     Rate and Rhythm: Normal rate and regular rhythm.  Pulmonary:     Effort: Pulmonary effort is normal. No respiratory distress.     Breath sounds: No wheezing or rales.  Abdominal:     General: There is no distension.     Palpations: Abdomen is soft.     Tenderness: There is no abdominal tenderness.  Musculoskeletal:        General: No tenderness.     Cervical back: Normal range of motion and neck supple.     Right lower leg: Edema (trace pitting) present.     Left lower leg: Edema (trace pitting) present.  Skin:    General: Skin is warm and dry.  Neurological:     General: No focal deficit present.     Mental Status: She is alert and oriented to person, place, and time.  Psychiatric:        Mood and Affect: Mood normal.        Behavior: Behavior normal.        Thought Content: Thought content normal.     Assessment & Plan:  1: Chronic heart failure with preserved ejection fraction- - NYHA class II - euvolemic today - has scales but not weighing daily; instructed her to call for an overnight weight gain of >2 pounds or a weekly weight gain of >5 pounds - adds "some" salt to her food; reviewed the importance of not adding any salt to her food and to read food labels so that she can closely follow a 2000mg  sodium diet; written dietary  information and a low sodium cookbook were given to her - sees cardiology in [place] - has worn compression socks in the past and she was encouraged to put them on first thing in the morning with removal at bedtime - BNP 01/23/19 was 1499.0 - had received her flu vaccine for this season  2: HTN- - BP looks good today - has a PCP in [place] - BMP 01/23/19 reviewed and showed sodium 141, potassium 4.7, creatinine 2.56 and GFR 20  3: DM- - A1c 01/03/2019 was 6.4% - fasting glucose in clinic today was 119  Medication bottles were reviewed.   Patient opts to not make a return appointment at this time as she sees PCP and cardiology in [place] and has appointments scheduled with them. Instructed to call us  at anytime to schedule another appointment and patient was comfortable with this plan.

## 2019-02-02 ENCOUNTER — Encounter: Payer: Self-pay | Admitting: Family

## 2019-02-02 ENCOUNTER — Other Ambulatory Visit: Payer: Self-pay

## 2019-02-02 ENCOUNTER — Ambulatory Visit: Payer: Medicare Other | Attending: Family | Admitting: Family

## 2019-02-02 VITALS — BP 153/66 | HR 72 | Resp 16 | Ht 66.0 in | Wt 231.6 lb

## 2019-02-02 DIAGNOSIS — Z79899 Other long term (current) drug therapy: Secondary | ICD-10-CM | POA: Insufficient documentation

## 2019-02-02 DIAGNOSIS — Z950 Presence of cardiac pacemaker: Secondary | ICD-10-CM | POA: Insufficient documentation

## 2019-02-02 DIAGNOSIS — Z794 Long term (current) use of insulin: Secondary | ICD-10-CM | POA: Diagnosis not present

## 2019-02-02 DIAGNOSIS — Z7982 Long term (current) use of aspirin: Secondary | ICD-10-CM | POA: Diagnosis not present

## 2019-02-02 DIAGNOSIS — E1122 Type 2 diabetes mellitus with diabetic chronic kidney disease: Secondary | ICD-10-CM | POA: Diagnosis not present

## 2019-02-02 DIAGNOSIS — I10 Essential (primary) hypertension: Secondary | ICD-10-CM

## 2019-02-02 DIAGNOSIS — I5032 Chronic diastolic (congestive) heart failure: Secondary | ICD-10-CM | POA: Insufficient documentation

## 2019-02-02 DIAGNOSIS — I130 Hypertensive heart and chronic kidney disease with heart failure and stage 1 through stage 4 chronic kidney disease, or unspecified chronic kidney disease: Secondary | ICD-10-CM | POA: Insufficient documentation

## 2019-02-02 DIAGNOSIS — N184 Chronic kidney disease, stage 4 (severe): Secondary | ICD-10-CM

## 2019-02-02 DIAGNOSIS — N189 Chronic kidney disease, unspecified: Secondary | ICD-10-CM | POA: Insufficient documentation

## 2019-02-02 LAB — GLUCOSE, CAPILLARY: Glucose-Capillary: 119 mg/dL — ABNORMAL HIGH (ref 70–99)

## 2019-02-02 NOTE — Patient Instructions (Addendum)
Start weighing daily and call for an overnight weight gain of > 2 pounds or a weekly weight gain of >5 pounds.  Get some compression socks and put them on in the morning with removal at bedtime.   Follow closely with your primary car doctor and your heart doctor in [place] and call us if you need to schedule another appointment.

## 2019-04-02 ENCOUNTER — Other Ambulatory Visit: Payer: Self-pay

## 2019-04-02 ENCOUNTER — Emergency Department
Admission: EM | Admit: 2019-04-02 | Discharge: 2019-04-02 | Disposition: A | Discharge status: Home / Self Care | Payer: Medicare Other | Attending: Student | Admitting: Student

## 2019-04-02 ENCOUNTER — Encounter: Payer: Self-pay | Admitting: Emergency Medicine

## 2019-04-02 DIAGNOSIS — Z794 Long term (current) use of insulin: Secondary | ICD-10-CM | POA: Insufficient documentation

## 2019-04-02 DIAGNOSIS — E1122 Type 2 diabetes mellitus with diabetic chronic kidney disease: Secondary | ICD-10-CM | POA: Insufficient documentation

## 2019-04-02 DIAGNOSIS — I5032 Chronic diastolic (congestive) heart failure: Secondary | ICD-10-CM

## 2019-04-02 DIAGNOSIS — R609 Edema, unspecified: Secondary | ICD-10-CM | POA: Diagnosis not present

## 2019-04-02 DIAGNOSIS — Z79899 Other long term (current) drug therapy: Secondary | ICD-10-CM | POA: Diagnosis not present

## 2019-04-02 DIAGNOSIS — Z7982 Long term (current) use of aspirin: Secondary | ICD-10-CM | POA: Diagnosis not present

## 2019-04-02 DIAGNOSIS — R600 Localized edema: Secondary | ICD-10-CM

## 2019-04-02 DIAGNOSIS — I130 Hypertensive heart and chronic kidney disease with heart failure and stage 1 through stage 4 chronic kidney disease, or unspecified chronic kidney disease: Secondary | ICD-10-CM | POA: Diagnosis not present

## 2019-04-02 DIAGNOSIS — R2243 Localized swelling, mass and lump, lower limb, bilateral: Secondary | ICD-10-CM | POA: Diagnosis present

## 2019-04-02 DIAGNOSIS — Z950 Presence of cardiac pacemaker: Secondary | ICD-10-CM | POA: Insufficient documentation

## 2019-04-02 DIAGNOSIS — Z20822 Contact with and (suspected) exposure to covid-19: Secondary | ICD-10-CM | POA: Insufficient documentation

## 2019-04-02 DIAGNOSIS — N184 Chronic kidney disease, stage 4 (severe): Secondary | ICD-10-CM | POA: Insufficient documentation

## 2019-04-02 LAB — BASIC METABOLIC PANEL
Anion gap: 14 (ref 5–15)
BUN: 55 mg/dL — ABNORMAL HIGH (ref 8–23)
CO2: 28 mmol/L (ref 22–32)
Calcium: 9.3 mg/dL (ref 8.9–10.3)
Chloride: 102 mmol/L (ref 98–111)
Creatinine, Ser: 4.18 mg/dL — ABNORMAL HIGH (ref 0.44–1.00)
GFR calc Af Amer: 11 mL/min — ABNORMAL LOW (ref >60)
GFR calc non Af Amer: 10 mL/min — ABNORMAL LOW (ref >60)
Glucose, Bld: 102 mg/dL — ABNORMAL HIGH (ref 70–99)
Potassium: 4.1 mmol/L (ref 3.5–5.1)
Sodium: 144 mmol/L (ref 135–145)

## 2019-04-02 LAB — CBC
HCT: 33.4 % — ABNORMAL LOW (ref 36.0–46.0)
Hemoglobin: 11 g/dL — ABNORMAL LOW (ref 12.0–15.0)
MCH: 29.6 pg (ref 26.0–34.0)
MCHC: 32.9 g/dL (ref 30.0–36.0)
MCV: 90 fL (ref 80.0–100.0)
Platelets: 216 10*3/uL (ref 150–400)
RBC: 3.71 MIL/uL — ABNORMAL LOW (ref 3.87–5.11)
RDW: 16.5 % — ABNORMAL HIGH (ref 11.5–15.5)
WBC: 5.4 10*3/uL (ref 4.0–10.5)
nRBC: 0 % (ref 0.0–0.2)

## 2019-04-02 MED ORDER — FUROSEMIDE 40 MG PO TABS
40.0000 mg | ORAL_TABLET | Freq: Once | ORAL | Status: AC
  Administered 2019-04-02: 40 mg via ORAL
  Filled 2019-04-02: qty 1

## 2019-04-02 NOTE — ED Provider Notes (Addendum)
[facility] Emergency Department Provider Note  ____________________________________________  Time seen: Approximately 4:15 PM  I have reviewed the triage vital signs and the nursing notes.   HISTORY  Chief Complaint Congestive Heart Failure and Leg Swelling    HPI Michele Beck is a 79 y.o. female who presents the emergency department concern for swelling in her ankles.  Patient states that she has a history of congestive heart failure, she states that when she starts to have swelling, her heart failure typically gets a lot worse.  Patient states before her edema continues to increase and she needs to come into the hospital she wanted to present to the emergency department for "more medicine so I did have to stay in the hospital."  Patient denies any chest pain, shortness of breath.  She does not weigh herself on a routine basis.  Patient has no URI complaints.  Her only complaint at this time is fullness in bilateral ankles.  No associated leg pain with same.         Past Medical History:  Diagnosis Date  . Arthritis   . CHF (congestive heart failure) ([place])   . Diabetes mellitus without complication ([place])   . Hypertension   . Renal disorder     Patient Active Problem List   Diagnosis Date Noted  . Peripheral edema   . Pulmonary HTN ([place])   . Acute on chronic diastolic heart failure ([place])   . Acute renal failure ([place])   . Acute CHF (congestive heart failure) ([place]) 01/22/2019  . Essential hypertension   . Chronic renal impairment   . Type 2 diabetes mellitus with stage 4 chronic kidney disease, without long-term current use of insulin ([place])   . Acute on chronic congestive heart failure ([place]) 01/02/2019    Past Surgical History:  Procedure Laterality Date  . BACK SURGERY    . PACEMAKER PLACEMENT    . TUBAL LIGATION      Prior to Admission medications   Medication Sig Start Date End Date Taking? Authorizing Provider  allopurinol (ZYLOPRIM)  100 MG tablet Take 100 mg by mouth daily. 12/07/18   [provider]  amLODipine (NORVASC) 5 MG tablet Take 5 mg by mouth daily. 11/02/18   [provider]  aspirin 81 MG chewable tablet Chew 81 mg by mouth daily.    [provider]  atorvastatin (LIPITOR) 20 MG tablet Take 20 mg by mouth daily. 12/27/18   [provider]  Cholecalciferol (VITAMIN D-3) 25 MCG (1000 UT) CAPS Take 2 capsules by mouth daily.    [provider]  ferrous sulfate 325 (65 FE) MG tablet Take 325 mg by mouth 2 (two) times daily. 10/04/18   [provider]  hydrALAZINE (APRESOLINE) 100 MG tablet Take 100 mg by mouth 3 (three) times daily. 10/04/18   [provider]  LEVEMIR FLEXTOUCH 100 UNIT/ML Pen SMARTSIG:6 Unit(s) SUB-Q Every Night 07/20/18   [provider]  metoprolol tartrate (LOPRESSOR) 100 MG tablet Take 100 mg by mouth 2 (two) times daily. 12/27/18   [provider]  torsemide (DEMADEX) 20 MG tablet Take 2 tablets (40 mg total) by mouth daily. 01/23/19   Thornell Mule, MD  vitamin B-12 (CYANOCOBALAMIN) 1000 MCG tablet Take 1,000 mcg by mouth daily.    [provider]    Allergies Patient has no known allergies.  History reviewed. No pertinent family history.  Social History Social History   Tobacco Use  . Smoking status: Never Smoker  .  Smokeless tobacco: Never Used  Substance Use Topics  . Alcohol use: Never  . Drug use: Never     Review of Systems  Constitutional: No fever/chills Eyes: No visual changes. No discharge ENT: No upper respiratory complaints. Cardiovascular: no chest pain.  Increasing edema to bilateral ankles Respiratory: no cough. No SOB. Gastrointestinal: No abdominal pain.  No nausea, no vomiting.  No diarrhea.  No constipation. Genitourinary: Negative for dysuria. No hematuria Musculoskeletal: Negative for musculoskeletal pain. Skin: Negative for rash, abrasions, lacerations,  ecchymosis. Neurological: Negative for headaches, focal weakness or numbness. 10-point ROS otherwise negative.  ____________________________________________   PHYSICAL EXAM:  VITAL SIGNS: ED Triage Vitals  Enc Vitals Group     BP 04/02/19 1459 (!) 140/50     Pulse Rate 04/02/19 1459 64     Resp 04/02/19 1459 18     Temp 04/02/19 1459 98.1 F (36.7 C)     Temp Source 04/02/19 1459 Oral     SpO2 04/02/19 1459 94 %     Weight 04/02/19 1456 200 lb (90.7 kg)     Height 04/02/19 1456 5\' 6"  (1.676 m)     Head Circumference --      Peak Flow --      Pain Score 04/02/19 1456 0     Pain Loc --      Pain Edu? --      Excl. in [place]? --      Constitutional: Alert and oriented. Well appearing and in no acute distress. Eyes: Conjunctivae are normal. PERRL. EOMI. Head: Atraumatic. ENT:      Ears:       Nose: No congestion/rhinnorhea.      Mouth/Throat: Mucous membranes are moist.  Neck: No stridor.    Cardiovascular: Normal rate, regular rhythm. Normal S1 and S2.  Good peripheral circulation.  Visualization of bilateral ankles reveals edema bilaterally, nonpitting.  Patient does have findings consistent with vascular insufficiency to bilateral lower extremities as well.  No gross erythema or edema.  Dorsalis pedis pulse intact bilaterally Respiratory: Normal respiratory effort without tachypnea or retractions. Lungs CTAB. Good air entry to the bases with no decreased or absent breath sounds. Musculoskeletal: Full range of motion to all extremities. No gross deformities appreciated. Neurologic:  Normal speech and language. No gross focal neurologic deficits are appreciated.  Skin:  Skin is warm, dry and intact. No rash noted. Psychiatric: Mood and affect are normal. Speech and behavior are normal. Patient exhibits appropriate insight and judgement.   ____________________________________________   LABS (all labs ordered are listed, but only abnormal results are displayed)  Labs  Reviewed  CBC - Abnormal; Notable for the following components:      Result Value   RBC 3.71 (*)    Hemoglobin 11.0 (*)    HCT 33.4 (*)    RDW 16.5 (*)    All other components within normal limits  SARS CORONAVIRUS 2 (TAT 6-24 HRS)  BASIC METABOLIC PANEL   ____________________________________________  EKG  ED ECG REPORT I, Charline Bills Torrin Crihfield,  personally viewed and interpreted this ECG.   Date: 04/02/2019  EKG Time: 1458 hrs.  Rate: 62 bpm  Rhythm: unchanged from previous tracings, normal sinus rhythm  Axis: Normal axis  Intervals:none  ST&T Change: No ST elevation or depression noted  Normal sinus rhythm.  No STEMI.  ____________________________________________  RADIOLOGY   No results found.  ____________________________________________    PROCEDURES  Procedure(s) performed:    Procedures    Medications  furosemide (LASIX) tablet  40 mg (40 mg Oral Given 04/02/19 1634)     ____________________________________________   INITIAL IMPRESSION / ASSESSMENT AND PLAN / ED COURSE  Pertinent labs & imaging results that were available during my care of the patient were reviewed by me and considered in my medical decision making (see chart for details).  Review of the [place] CSRS was performed in accordance of the [place] prior to dispensing any controlled drugs.           Patient's diagnosis is consistent with congestive heart failure.  Patient presented to emergency department with increased edema to bilateral ankles.  Patient presents that she is concerned that this is typically the first sign prior to CHF exacerbation.  Patient does not weigh herself on a regular basis.  She denies any chest pain, shortness of breath, cough.  No fevers or URI symptoms.  Patient is here requesting medication so "he does not get bad enough that he have to be admitted."  Patient states that she is nowhere near as bad as her previous recent admissions for CHF.  Patient was switched  from Lasix to torsemide.  At this time I will give the patient an additional dose of Lasix.  I do not feel like the patient requires admission at this time.  We have discussed at length management of her CHF.  The patient will be referred to the heart failure clinic.  No further work-up deemed necessary at this time.  Patient is given instructions to weigh her self daily.  Return precautions are discussed at length with the patient..  I have instructed the patient to take an additional dose of her torsemide tomorrow.  Follow-up with CHF clinic.  Patient is given ED precautions to return to the ED for any worsening or new symptoms.     ____________________________________________  FINAL CLINICAL IMPRESSION(S) / ED DIAGNOSES  Final diagnoses:  Peripheral edema  Chronic diastolic congestive heart failure (HCC)      NEW MEDICATIONS STARTED DURING THIS VISIT:  ED Discharge Orders    None          This chart was dictated using voice recognition software/Dragon. Despite best efforts to proofread, errors can occur which can change the meaning. Any change was purely unintentional.    Darletta Moll, PA-C 04/02/19 1642    Kileigh Ortmann, Charline Bills, PA-C 04/02/19 1644    Sheron Tallman, Charline Bills, PA-C 04/02/19 1645    Lilia Pro., MD 04/03/19 [phone]

## 2019-04-02 NOTE — ED Triage Notes (Addendum)
Pt arrived via POV from home with reports of increased swelling to lower extremities x [redacted] week along with decreased appetite.  Pt reports hx of HF, states she was on Lasix but was switched to another medication\  Denies any shortness of breath,  Pt has 3+ pitting edema

## 2019-04-02 NOTE — Discharge Instructions (Signed)
Take an additional tablet of your torsemide (fluid pill) tomorrow.  Please follow-up with the congestive heart failure clinic as listed.  Return to the emergency department for any shortness of breath, chest pain, worsening leg swelling even while taking the increased dosing of medication.

## 2019-04-03 LAB — SARS CORONAVIRUS 2 (TAT 6-24 HRS): SARS Coronavirus 2: NEGATIVE

## 2019-04-04 NOTE — Progress Notes (Signed)
Patient ID: Michele Beck, female    DOB: 1940-02-24, 79 y.o.   MRN: [id number]  HPI  Michele Beck is a 79 y/o female with a history of DM, HTN, CKD, arthritis, pacemaker and chronic heart failure.   Echo report from 01/03/2019 reviewed and showed an EF of 55-60% along with mild AR and severely elevated PA pressure.   Was in the ED 04/02/19 due to HF and peripheral edema. Given additional diuretic and she was released. Admitted 01/22/19 due to acute on chronic heart failure. Cardiology and nephrology consults obtained. Initially needed IV lasix and then transitioned to oral diuretics. Discharged the following day.   She presents today for a follow-up visit with a chief complaint of moderate shortness of breath upon minimal exertion. She describes this as chronic in nature having been present for several years although has worsened over the last few days resulting in an ED visit 3 days ago. She has associated fatigue, cough, pedal edema, abdominal distention, leg weakness and weight gain. She denies any dizziness, palpitations or chest pain.    Lives in [place] but visits with her son who lives here in [place]. Has PCP and cardiology in [place]. Has never seen nephrology but daughter that is present says that it has been mentioned to her in the past but no referral has been made.   Past Medical History:  Diagnosis Date  . Arthritis   . CHF (congestive heart failure) ([place])   . Diabetes mellitus without complication ([place])   . Hypertension   . Renal disorder    Past Surgical History:  Procedure Laterality Date  . BACK SURGERY    . PACEMAKER PLACEMENT    . TUBAL LIGATION     No family history on file. Social History   Tobacco Use  . Smoking status: Never Smoker  . Smokeless tobacco: Never Used  Substance Use Topics  . Alcohol use: Never   No Known Allergies  Prior to Admission medications   Medication Sig Start Date End Date Taking? Authorizing Provider  allopurinol (ZYLOPRIM) 100 MG  tablet Take 100 mg by mouth daily. 12/07/18  Yes [provider]  amLODipine (NORVASC) 5 MG tablet Take 5 mg by mouth daily. 11/02/18  Yes [provider]  aspirin 81 MG chewable tablet Chew 81 mg by mouth daily.   Yes [provider]  atorvastatin (LIPITOR) 20 MG tablet Take 20 mg by mouth daily. 12/27/18  Yes [provider]  Cholecalciferol (VITAMIN D-3) 25 MCG (1000 UT) CAPS Take 2 capsules by mouth daily.   Yes [provider]  ferrous sulfate 325 (65 FE) MG tablet Take 325 mg by mouth 2 (two) times daily. 10/04/18  Yes [provider]  hydrALAZINE (APRESOLINE) 100 MG tablet Take 100 mg by mouth 3 (three) times daily. 10/04/18  Yes [provider]  LEVEMIR FLEXTOUCH 100 UNIT/ML Pen SMARTSIG:6 Unit(s) SUB-Q Every Night 07/20/18  Yes [provider]  metoprolol tartrate (LOPRESSOR) 100 MG tablet Take 100 mg by mouth 2 (two) times daily. 12/27/18  Yes [provider]  torsemide (DEMADEX) 20 MG tablet Take 2 tablets (40 mg total) by mouth daily. Patient taking differently: Take 40 mg by mouth 2 (two) times daily.  01/23/19  Yes Thornell Mule, MD  vitamin B-12 (CYANOCOBALAMIN) 1000 MCG tablet Take 1,000 mcg by mouth daily.   Yes [provider]    Review of Systems  Constitutional: Positive for fatigue (easily). Negative for appetite change.  HENT: Positive for sore  throat (scratchy throat). Negative for congestion and rhinorrhea.   Eyes: Negative.   Respiratory: Positive for cough and shortness of breath (easily). Negative for chest tightness.   Cardiovascular: Positive for leg swelling. Negative for chest pain and palpitations.  Gastrointestinal: Positive for abdominal distention. Negative for abdominal pain.  Endocrine: Negative.   Genitourinary: Negative.   Musculoskeletal: Negative for back pain and neck pain.  Skin: Negative.   Allergic/Immunologic: Negative.   Neurological: Positive for weakness  (legs). Negative for dizziness and light-headedness.  Hematological: Negative for adenopathy. Does not bruise/bleed easily.  Psychiatric/Behavioral: Negative for dysphoric mood and sleep disturbance (sleeping on 2 pillows). The patient is not nervous/anxious.     Vitals:   04/05/19 1115  BP: (!) 112/58  Pulse: (!) 125  Resp: 18  SpO2: 91%  Weight: 240 lb 4 oz (109 kg)  Height: 5\' 3"  (1.6 m)   Wt Readings from Last 3 Encounters:  04/05/19 240 lb 4 oz (109 kg)  04/02/19 200 lb (90.7 kg)  02/02/19 231 lb 9.6 oz (105.1 kg)   Lab Results  Component Value Date   CREATININE 4.18 (H) 04/02/2019   CREATININE 2.56 (H) 01/23/2019   CREATININE 3.12 (H) 01/21/2019     Physical Exam Vitals and nursing note reviewed.  Constitutional:      Appearance: Normal appearance.  HENT:     Head: Normocephalic and atraumatic.  Cardiovascular:     Rate and Rhythm: Normal rate and regular rhythm.  Pulmonary:     Effort: Pulmonary effort is normal. No respiratory distress.     Breath sounds: No wheezing or rales.  Abdominal:     General: There is distension.     Tenderness: There is no abdominal tenderness.  Musculoskeletal:        General: No tenderness.     Cervical back: Normal range of motion and neck supple.     Right lower leg: Edema (1+ pitting) present.     Left lower leg: Edema (1+ pitting) present.  Skin:    General: Skin is warm and dry.  Neurological:     General: No focal deficit present.     Mental Status: She is alert and oriented to person, place, and time.  Psychiatric:        Mood and Affect: Mood normal.        Behavior: Behavior normal.        Thought Content: Thought content normal.     Assessment & Plan:  1: Acute on chhronic heart failure with preserved ejection fraction- - NYHA class III - moderately fluid overloaded today - weighing daily; reminded to call for an overnight weight gain of >2 pounds or a weekly weight gain of >5 pounds - weight up 9 pounds  from last visit here 2 months ago - adds "some" salt to her food; reviewed the importance of not adding any salt to her food and to read food labels so that she can closely follow a 2000mg  sodium diet - sees cardiology in [place] - has worn compression socks in the past and she was encouraged to put them on first thing in the morning with removal at bedtime - will send for 80mg  IV lasix/ 94meq PO potassium today - will also get BMP/BNP today - BNP 01/23/19 was 1499.0 - had received her flu vaccine for this season  2: HTN- - BP looks good today - has a PCP in [place] - BMP 04/02/19 reviewed and showed sodium 144, potassium 4.1, creatinine 4.18 and  GFR 11  - will make nephrology referral & patient and daughter were in agreement  3: DM- - A1c 01/03/2019 was 6.4% - nonfasting glucose in clinic today was 158   Patient did not bring her medications nor a list. Each medication was verbally reviewed with the patient and she was encouraged to bring the bottles to every visit to confirm accuracy of list.  Return tomorrow for a recheck of symptoms

## 2019-04-05 ENCOUNTER — Other Ambulatory Visit: Payer: Self-pay | Admitting: Family

## 2019-04-05 ENCOUNTER — Encounter: Payer: Self-pay | Admitting: Family

## 2019-04-05 ENCOUNTER — Ambulatory Visit: Payer: Medicare Other | Admitting: Family

## 2019-04-05 ENCOUNTER — Other Ambulatory Visit: Payer: Self-pay

## 2019-04-05 ENCOUNTER — Ambulatory Visit
Admission: RE | Admit: 2019-04-05 | Discharge: 2019-04-05 | Disposition: A | Discharge status: Home / Self Care | Payer: Medicare Other | Source: Ambulatory Visit | Attending: Family | Admitting: Family

## 2019-04-05 VITALS — BP 112/58 | HR 125 | Resp 18 | Ht 63.0 in | Wt 240.2 lb

## 2019-04-05 DIAGNOSIS — I5033 Acute on chronic diastolic (congestive) heart failure: Secondary | ICD-10-CM

## 2019-04-05 DIAGNOSIS — I509 Heart failure, unspecified: Secondary | ICD-10-CM | POA: Diagnosis not present

## 2019-04-05 DIAGNOSIS — I10 Essential (primary) hypertension: Secondary | ICD-10-CM

## 2019-04-05 DIAGNOSIS — Z79899 Other long term (current) drug therapy: Secondary | ICD-10-CM | POA: Insufficient documentation

## 2019-04-05 DIAGNOSIS — Z950 Presence of cardiac pacemaker: Secondary | ICD-10-CM | POA: Insufficient documentation

## 2019-04-05 DIAGNOSIS — E1122 Type 2 diabetes mellitus with diabetic chronic kidney disease: Secondary | ICD-10-CM | POA: Diagnosis not present

## 2019-04-05 DIAGNOSIS — M1990 Unspecified osteoarthritis, unspecified site: Secondary | ICD-10-CM | POA: Insufficient documentation

## 2019-04-05 DIAGNOSIS — I130 Hypertensive heart and chronic kidney disease with heart failure and stage 1 through stage 4 chronic kidney disease, or unspecified chronic kidney disease: Secondary | ICD-10-CM | POA: Insufficient documentation

## 2019-04-05 DIAGNOSIS — Z7982 Long term (current) use of aspirin: Secondary | ICD-10-CM | POA: Diagnosis not present

## 2019-04-05 DIAGNOSIS — Z794 Long term (current) use of insulin: Secondary | ICD-10-CM | POA: Diagnosis not present

## 2019-04-05 DIAGNOSIS — N184 Chronic kidney disease, stage 4 (severe): Secondary | ICD-10-CM

## 2019-04-05 DIAGNOSIS — N189 Chronic kidney disease, unspecified: Secondary | ICD-10-CM | POA: Insufficient documentation

## 2019-04-05 LAB — BASIC METABOLIC PANEL
Anion gap: 13 (ref 5–15)
BUN: 50 mg/dL — ABNORMAL HIGH (ref 8–23)
CO2: 25 mmol/L (ref 22–32)
Calcium: 8.8 mg/dL — ABNORMAL LOW (ref 8.9–10.3)
Chloride: 102 mmol/L (ref 98–111)
Creatinine, Ser: 3.7 mg/dL — ABNORMAL HIGH (ref 0.44–1.00)
GFR calc Af Amer: 13 mL/min — ABNORMAL LOW (ref >60)
GFR calc non Af Amer: 11 mL/min — ABNORMAL LOW (ref >60)
Glucose, Bld: 131 mg/dL — ABNORMAL HIGH (ref 70–99)
Potassium: 4 mmol/L (ref 3.5–5.1)
Sodium: 140 mmol/L (ref 135–145)

## 2019-04-05 LAB — BRAIN NATRIURETIC PEPTIDE: B Natriuretic Peptide: 1759 pg/mL — ABNORMAL HIGH (ref 0.0–100.0)

## 2019-04-05 LAB — GLUCOSE, CAPILLARY: Glucose-Capillary: 158 mg/dL — ABNORMAL HIGH (ref 70–99)

## 2019-04-05 MED ORDER — FUROSEMIDE 10 MG/ML IJ SOLN
INTRAMUSCULAR | Status: AC
  Filled 2019-04-05: qty 8

## 2019-04-05 MED ORDER — SODIUM CHLORIDE FLUSH 0.9 % IV SOLN
INTRAVENOUS | Status: AC
  Filled 2019-04-05: qty 10

## 2019-04-05 MED ORDER — POTASSIUM CHLORIDE CRYS ER 20 MEQ PO TBCR
40.0000 meq | EXTENDED_RELEASE_TABLET | Freq: Once | ORAL | Status: AC
  Administered 2019-04-05: 40 meq via ORAL

## 2019-04-05 MED ORDER — POTASSIUM CHLORIDE CRYS ER 20 MEQ PO TBCR
EXTENDED_RELEASE_TABLET | ORAL | Status: AC
  Filled 2019-04-05: qty 2

## 2019-04-05 MED ORDER — FUROSEMIDE 10 MG/ML IJ SOLN
80.0000 mg | Freq: Once | INTRAMUSCULAR | Status: AC
  Administered 2019-04-05: 80 mg via INTRAVENOUS

## 2019-04-05 NOTE — OR Nursing (Signed)
Lab results and vital signs called to Rolena Infante NP.  No new orders and may give medications previously ordered for this visit.

## 2019-04-05 NOTE — Patient Instructions (Signed)
Continue weighing daily and call for an overnight weight gain of > 2 pounds or a weekly weight gain of >5 pounds. 

## 2019-04-06 ENCOUNTER — Other Ambulatory Visit: Payer: Self-pay | Admitting: Family

## 2019-04-06 ENCOUNTER — Ambulatory Visit: Payer: Medicare Other | Admitting: Family

## 2019-04-06 ENCOUNTER — Ambulatory Visit
Admission: RE | Admit: 2019-04-06 | Discharge: 2019-04-06 | Disposition: A | Discharge status: Home / Self Care | Payer: Medicare Other | Source: Ambulatory Visit | Attending: Family | Admitting: Family

## 2019-04-06 ENCOUNTER — Encounter: Payer: Self-pay | Admitting: Family

## 2019-04-06 VITALS — BP 122/64 | HR 53 | Resp 18 | Ht 64.0 in | Wt 239.0 lb

## 2019-04-06 DIAGNOSIS — Z950 Presence of cardiac pacemaker: Secondary | ICD-10-CM | POA: Insufficient documentation

## 2019-04-06 DIAGNOSIS — Z794 Long term (current) use of insulin: Secondary | ICD-10-CM | POA: Diagnosis not present

## 2019-04-06 DIAGNOSIS — Z7982 Long term (current) use of aspirin: Secondary | ICD-10-CM | POA: Insufficient documentation

## 2019-04-06 DIAGNOSIS — M1990 Unspecified osteoarthritis, unspecified site: Secondary | ICD-10-CM | POA: Insufficient documentation

## 2019-04-06 DIAGNOSIS — I5033 Acute on chronic diastolic (congestive) heart failure: Secondary | ICD-10-CM

## 2019-04-06 DIAGNOSIS — Z79899 Other long term (current) drug therapy: Secondary | ICD-10-CM | POA: Diagnosis not present

## 2019-04-06 DIAGNOSIS — N189 Chronic kidney disease, unspecified: Secondary | ICD-10-CM | POA: Diagnosis not present

## 2019-04-06 DIAGNOSIS — I130 Hypertensive heart and chronic kidney disease with heart failure and stage 1 through stage 4 chronic kidney disease, or unspecified chronic kidney disease: Secondary | ICD-10-CM | POA: Insufficient documentation

## 2019-04-06 DIAGNOSIS — E1122 Type 2 diabetes mellitus with diabetic chronic kidney disease: Secondary | ICD-10-CM | POA: Insufficient documentation

## 2019-04-06 DIAGNOSIS — I10 Essential (primary) hypertension: Secondary | ICD-10-CM

## 2019-04-06 LAB — BASIC METABOLIC PANEL
Anion gap: 11 (ref 5–15)
BUN: 53 mg/dL — ABNORMAL HIGH (ref 8–23)
CO2: 25 mmol/L (ref 22–32)
Calcium: 9 mg/dL (ref 8.9–10.3)
Chloride: 107 mmol/L (ref 98–111)
Creatinine, Ser: 3.25 mg/dL — ABNORMAL HIGH (ref 0.44–1.00)
GFR calc Af Amer: 15 mL/min — ABNORMAL LOW (ref >60)
GFR calc non Af Amer: 13 mL/min — ABNORMAL LOW (ref >60)
Glucose, Bld: 118 mg/dL — ABNORMAL HIGH (ref 70–99)
Potassium: 4.7 mmol/L (ref 3.5–5.1)
Sodium: 143 mmol/L (ref 135–145)

## 2019-04-06 LAB — BRAIN NATRIURETIC PEPTIDE: B Natriuretic Peptide: 2191 pg/mL — ABNORMAL HIGH (ref 0.0–100.0)

## 2019-04-06 LAB — GLUCOSE, CAPILLARY: Glucose-Capillary: 82 mg/dL (ref 70–99)

## 2019-04-06 MED ORDER — SODIUM CHLORIDE FLUSH 0.9 % IV SOLN
INTRAVENOUS | Status: AC
  Administered 2019-04-06: 10 mL
  Filled 2019-04-06: qty 20

## 2019-04-06 MED ORDER — POTASSIUM CHLORIDE CRYS ER 20 MEQ PO TBCR
EXTENDED_RELEASE_TABLET | ORAL | Status: AC
  Administered 2019-04-06: 40 meq via ORAL
  Filled 2019-04-06: qty 2

## 2019-04-06 MED ORDER — POTASSIUM CHLORIDE CRYS ER 20 MEQ PO TBCR: 40.0000 meq | EXTENDED_RELEASE_TABLET | Freq: Once | ORAL | Status: AC

## 2019-04-06 MED ORDER — FUROSEMIDE 10 MG/ML IJ SOLN: 80.0000 mg | Freq: Once | INTRAMUSCULAR | Status: AC

## 2019-04-06 MED ORDER — FUROSEMIDE 10 MG/ML IJ SOLN
INTRAMUSCULAR | Status: AC
  Administered 2019-04-06: 80 mg via INTRAVENOUS
  Filled 2019-04-06: qty 8

## 2019-04-06 NOTE — Patient Instructions (Signed)
Continue weighing daily and call for an overnight weight gain of > 2 pounds or a weekly weight gain of >5 pounds. 

## 2019-04-06 NOTE — Progress Notes (Signed)
Patient ID: Michele Beck, female    DOB: 16-Jul-1940, 79 y.o.   MRN: [id number]  HPI  Michele Beck is a 79 y/o female with a history of DM, HTN, CKD, arthritis, pacemaker and chronic heart failure.   Echo report from 01/03/2019 reviewed and showed an EF of 55-60% along with mild AR and severely elevated PA pressure.   Was in the ED 04/02/19 due to HF and peripheral edema. Given additional diuretic and she was released. Admitted 01/22/19 due to acute on chronic heart failure. Cardiology and nephrology consults obtained. Initially needed IV lasix and then transitioned to oral diuretics. Discharged the following day.   She presents today for a follow-up visit with a chief complaint of moderate shortness of breath upon minimal exertion. She describes this as chronic in nature having been present for several years although she does feel "a little" better from yesterday. She has associated fatigue, cough, pedal edema and weakness along with this. She denies any difficulty sleeping, dizziness, abdominal distention, palpitations, chest pain or weight gain.   Received 80mg  IV lasix/ 27meq PO potassium yesterday  Past Medical History:  Diagnosis Date  . Arthritis   . CHF (congestive heart failure) ([place])   . Diabetes mellitus without complication ([place])   . Hypertension   . Renal disorder    Past Surgical History:  Procedure Laterality Date  . BACK SURGERY    . PACEMAKER PLACEMENT    . TUBAL LIGATION     No family history on file. Social History   Tobacco Use  . Smoking status: Never Smoker  . Smokeless tobacco: Never Used  Substance Use Topics  . Alcohol use: Never   No Known Allergies  Prior to Admission medications   Medication Sig Start Date End Date Taking? Authorizing Provider  allopurinol (ZYLOPRIM) 100 MG tablet Take 100 mg by mouth daily. 12/07/18  Yes [provider]  amLODipine (NORVASC) 5 MG tablet Take 5 mg by mouth daily. 11/02/18  Yes [provider]   aspirin 81 MG chewable tablet Chew 81 mg by mouth daily.   Yes [provider]  atorvastatin (LIPITOR) 20 MG tablet Take 20 mg by mouth daily. 12/27/18  Yes [provider]  Cholecalciferol (VITAMIN D-3) 25 MCG (1000 UT) CAPS Take 2 capsules by mouth daily.   Yes [provider]  ferrous sulfate 325 (65 FE) MG tablet Take 325 mg by mouth 2 (two) times daily. 10/04/18  Yes [provider]  hydrALAZINE (APRESOLINE) 100 MG tablet Take 100 mg by mouth 3 (three) times daily. 10/04/18  Yes [provider]  LEVEMIR FLEXTOUCH 100 UNIT/ML Pen SMARTSIG:6 Unit(s) SUB-Q Every Night 07/20/18  Yes [provider]  metoprolol tartrate (LOPRESSOR) 100 MG tablet Take 100 mg by mouth 2 (two) times daily. 12/27/18  Yes [provider]  torsemide (DEMADEX) 20 MG tablet Take 2 tablets (40 mg total) by mouth daily. Patient taking differently: Take 40 mg by mouth 2 (two) times daily.  01/23/19  Yes Thornell Mule, MD  vitamin B-12 (CYANOCOBALAMIN) 1000 MCG tablet Take 1,000 mcg by mouth daily.   Yes [provider]     Review of Systems  Constitutional: Positive for fatigue (easily). Negative for appetite change.  HENT: Positive for sore throat (scratchy throat). Negative for congestion and rhinorrhea.   Eyes: Negative.   Respiratory: Positive for cough and shortness of breath (improving). Negative for chest tightness.   Cardiovascular: Positive for leg swelling. Negative for chest pain and palpitations.  Gastrointestinal: Negative for abdominal distention and abdominal pain.  Endocrine: Negative.   Genitourinary: Negative.   Musculoskeletal: Negative for back pain and neck pain.  Skin: Negative.   Allergic/Immunologic: Negative.   Neurological: Positive for weakness (legs). Negative for dizziness and light-headedness.  Hematological: Negative for adenopathy. Does not bruise/bleed easily.  Psychiatric/Behavioral: Negative for dysphoric mood  and sleep disturbance (sleeping on 2 pillows). The patient is not nervous/anxious.    Vitals:   04/06/19 1115  BP: 122/64  Pulse: (!) 53  Resp: 18  SpO2: 96%  Weight: 239 lb (108.4 kg)  Height: 5\' 4"  (1.626 m)   Wt Readings from Last 3 Encounters:  04/06/19 239 lb (108.4 kg)  04/05/19 240 lb 4 oz (109 kg)  04/02/19 200 lb (90.7 kg)   Lab Results  Component Value Date   CREATININE 3.70 (H) 04/05/2019   CREATININE 4.18 (H) 04/02/2019   CREATININE 2.56 (H) 01/23/2019     Physical Exam Vitals and nursing note reviewed.  Constitutional:      Appearance: Normal appearance.  HENT:     Head: Normocephalic and atraumatic.  Cardiovascular:     Rate and Rhythm: Normal rate and regular rhythm.  Pulmonary:     Effort: Pulmonary effort is normal. No respiratory distress.     Breath sounds: No wheezing or rales.  Abdominal:     General: There is no distension.     Palpations: Abdomen is soft.     Tenderness: There is no abdominal tenderness.  Musculoskeletal:        General: No tenderness.     Cervical back: Normal range of motion and neck supple.     Right lower leg: Edema (1+ pitting) present.     Left lower leg: Edema (1+ pitting) present.  Skin:    General: Skin is warm and dry.  Neurological:     General: No focal deficit present.     Mental Status: She is alert and oriented to person, place, and time.  Psychiatric:        Mood and Affect: Mood normal.        Behavior: Behavior normal.        Thought Content: Thought content normal.     Assessment & Plan:  1: Acute on chhronic heart failure with preserved ejection fraction- - NYHA class III - remains moderately fluid overloaded today although slightly better from yesterday - weighing daily; reminded to call for an overnight weight gain of >2 pounds or a weekly weight gain of >5 pounds - weight down 1.4 pounds from last visit here yesterday - adds "some" salt to her food; reviewed the importance of not adding any  salt to her food and to read food labels so that she can closely follow a 2000mg  sodium diet - sees cardiology in [place] - still has to get compression socks; daughter says that she will get some today; encouraged her to put them on first thing in the morning with removal at bedtime - received 80mg  IV lasix/ 20meq PO potassium yesterday & will send back today for additional 80mg  IV lasix/ 37meq PO potassium - will get BMP/BNP today as well - BNP 04/05/19 was 1759.0  2: HTN- - BP looks good today - has a PCP in [place] - BMP 04/05/19 reviewed and showed sodium 140, potassium 4.0, creatinine 3.7 and GFR 13 - nephrology referral made yesterday (renal function slightly better)  3: DM- - A1c 01/03/2019 was 6.4% - nonfasting glucose in clinic today was 82  Patient did not bring her medications nor a list. Each medication was verbally reviewed with the patient and she was encouraged to bring the bottles to every visit to confirm accuracy of list.  Return in 2 days or sooner for any questions/problems before then.

## 2019-04-07 NOTE — Progress Notes (Signed)
Patient ID: Michele Beck, female    DOB: Apr 29, 1940, 79 y.o.   MRN: [id number]  HPI  Michele Beck is a 79 y/o female with a history of DM, HTN, CKD, arthritis, pacemaker and chronic heart failure.   Echo report from 01/03/2019 reviewed and showed an EF of 55-60% along with mild AR and severely elevated PA pressure.   Was in the ED 04/02/19 due to HF and peripheral edema. Given additional diuretic and she was released. Admitted 01/22/19 due to acute on chronic heart failure. Cardiology and nephrology consults obtained. Initially needed IV lasix and then transitioned to oral diuretics. Discharged the following day.   She presents today for a follow-up visit with a chief complaint of moderate shortness of breath upon minimal exertion although she does feel like her symptoms have improved from earlier in the week. She has associated fatigue, cough, pedal edema and weakness along with this. She denies any difficulty sleeping, dizziness, abdominal distention, palpitations or chest pain.   Admits that she's not weighing herself daily but does have scales. Michele Beck that is present with her says that they heard from nephrology and have an appointment scheduled on 05/19/19  Received 80mg  IV lasix/ 17meq PO potassium twice this week.   Past Medical History:  Diagnosis Date  . Arthritis   . CHF (congestive heart failure) ([place])   . Diabetes mellitus without complication ([place])   . Hypertension   . Renal disorder    Past Surgical History:  Procedure Laterality Date  . BACK SURGERY    . PACEMAKER PLACEMENT    . TUBAL LIGATION     No family history on file. Social History   Tobacco Use  . Smoking status: Never Smoker  . Smokeless tobacco: Never Used  Substance Use Topics  . Alcohol use: Never   No Known Allergies  Prior to Admission medications   Medication Sig Start Date End Date Taking? Authorizing Provider  allopurinol (ZYLOPRIM) 100 MG tablet Take 100 mg by mouth daily. 12/07/18  Yes  [provider]  amLODipine (NORVASC) 5 MG tablet Take 5 mg by mouth daily. 11/02/18  Yes [provider]  aspirin 81 MG chewable tablet Chew 81 mg by mouth daily.   Yes [provider]  atorvastatin (LIPITOR) 20 MG tablet Take 20 mg by mouth daily. 12/27/18  Yes [provider]  Cholecalciferol (VITAMIN D-3) 25 MCG (1000 UT) CAPS Take 2 capsules by mouth daily.   Yes [provider]  ferrous sulfate 325 (65 FE) MG tablet Take 325 mg by mouth 2 (two) times daily. 10/04/18  Yes [provider]  hydrALAZINE (APRESOLINE) 100 MG tablet Take 100 mg by mouth 3 (three) times daily. 10/04/18  Yes [provider]  LEVEMIR FLEXTOUCH 100 UNIT/ML Pen SMARTSIG:6 Unit(s) SUB-Q Every Night 07/20/18  Yes [provider]  metoprolol tartrate (LOPRESSOR) 100 MG tablet Take 100 mg by mouth 2 (two) times daily. 12/27/18  Yes [provider]  torsemide (DEMADEX) 20 MG tablet Take 2 tablets (40 mg total) by mouth daily. Patient taking differently: Take 40 mg by mouth 2 (two) times daily.  01/23/19  Yes Thornell Mule, MD  vitamin B-12 (CYANOCOBALAMIN) 1000 MCG tablet Take 1,000 mcg by mouth daily.   Yes [provider]     Review of Systems  Constitutional: Positive for fatigue (easily). Negative for appetite change.  HENT: Negative for congestion, rhinorrhea and sore throat.   Eyes: Negative.   Respiratory: Positive for cough and shortness of  breath (improving). Negative for chest tightness.   Cardiovascular: Positive for leg swelling (improving). Negative for chest pain and palpitations.  Gastrointestinal: Negative for abdominal distention and abdominal pain.  Endocrine: Negative.   Genitourinary: Negative.   Musculoskeletal: Negative for back pain and neck pain.  Skin: Negative.   Allergic/Immunologic: Negative.   Neurological: Positive for weakness (legs). Negative for dizziness and light-headedness.  Hematological:  Negative for adenopathy. Does not bruise/bleed easily.  Psychiatric/Behavioral: Negative for dysphoric mood and sleep disturbance (sleeping on 2 pillows). The patient is not nervous/anxious.    Vitals:   04/08/19 1112  BP: (!) 131/55  Pulse: (!) 53  Resp: 20  SpO2: 98%  Weight: 238 lb (108 kg)  Height: 5\' 3"  (1.6 m)   Wt Readings from Last 3 Encounters:  04/08/19 238 lb (108 kg)  04/06/19 239 lb (108.4 kg)  04/05/19 240 lb 4 oz (109 kg)   Lab Results  Component Value Date   CREATININE 3.25 (H) 04/06/2019   CREATININE 3.70 (H) 04/05/2019   CREATININE 4.18 (H) 04/02/2019    Physical Exam Vitals and nursing note reviewed.  Constitutional:      Appearance: Normal appearance.  HENT:     Head: Normocephalic and atraumatic.  Cardiovascular:     Rate and Rhythm: Normal rate and regular rhythm.  Pulmonary:     Effort: Pulmonary effort is normal. No respiratory distress.     Breath sounds: No wheezing or rales.  Abdominal:     General: There is no distension.     Palpations: Abdomen is soft.     Tenderness: There is no abdominal tenderness.  Musculoskeletal:        General: No tenderness.     Cervical back: Normal range of motion and neck supple.     Right lower leg: Edema (1+ pitting) present.     Left lower leg: Edema (1+ pitting) present.  Skin:    General: Skin is warm and dry.  Neurological:     General: No focal deficit present.     Mental Status: She is alert and oriented to person, place, and time.  Psychiatric:        Mood and Affect: Mood normal.        Behavior: Behavior normal.        Thought Content: Thought content normal.     Assessment & Plan:  1: Chronic heart failure with preserved ejection fraction- - NYHA class III - remains minimally fluid overloaded today - not weighing daily & she was encouraged to resume; reminded to call for an overnight weight gain of >2 pounds or a weekly weight gain of >5 pounds - weight down 1 pound from last visit here  2 days ago - adds "some" salt to her food; reviewed the importance of not adding any salt to her food and to read food labels so that she can closely follow a 2000mg  sodium diet - sees cardiology in [place] - will send back for 80mg  IV lasix/ 6meq PO potassium today - BMP/BNP to be drawn today - now wearing compression socks with removal at bedtime; trying to elevate her legs during the day - received 80mg  IV lasix/ 34meq PO potassium twice this week - BNP 04/06/19 was 2191.0  2: HTN- - BP looks good today - has a PCP in [place] - BMP 04/06/19 reviewed and showed sodium 143, potassium 4.7, creatinine 3.25 and GFR 15 - nephrology referral made and Michele Beck says patient has an appointment scheduled on 05/19/19  3: DM- - A1c 01/03/2019 was 6.4% - nonfasting glucose in clinic today was 86   Patient did not bring her medications nor a list. Each medication was verbally reviewed with the patient and she was encouraged to bring the bottles to every visit to confirm accuracy of list.   Return in 1 week or sooner for any questions/problems before then.

## 2019-04-08 ENCOUNTER — Ambulatory Visit: Payer: Medicare Other | Admitting: Family

## 2019-04-08 ENCOUNTER — Encounter: Payer: Self-pay | Admitting: Family

## 2019-04-08 ENCOUNTER — Other Ambulatory Visit: Payer: Self-pay | Admitting: Family

## 2019-04-08 ENCOUNTER — Ambulatory Visit
Admission: RE | Admit: 2019-04-08 | Discharge: 2019-04-08 | Disposition: A | Discharge status: Home / Self Care | Payer: Medicare Other | Source: Ambulatory Visit | Attending: Family | Admitting: Family

## 2019-04-08 ENCOUNTER — Other Ambulatory Visit: Payer: Self-pay

## 2019-04-08 VITALS — BP 131/55 | HR 53 | Resp 20 | Ht 63.0 in | Wt 238.0 lb

## 2019-04-08 DIAGNOSIS — Z794 Long term (current) use of insulin: Secondary | ICD-10-CM | POA: Insufficient documentation

## 2019-04-08 DIAGNOSIS — M1990 Unspecified osteoarthritis, unspecified site: Secondary | ICD-10-CM | POA: Insufficient documentation

## 2019-04-08 DIAGNOSIS — Z7982 Long term (current) use of aspirin: Secondary | ICD-10-CM | POA: Insufficient documentation

## 2019-04-08 DIAGNOSIS — I5033 Acute on chronic diastolic (congestive) heart failure: Secondary | ICD-10-CM

## 2019-04-08 DIAGNOSIS — E1122 Type 2 diabetes mellitus with diabetic chronic kidney disease: Secondary | ICD-10-CM

## 2019-04-08 DIAGNOSIS — Z79899 Other long term (current) drug therapy: Secondary | ICD-10-CM | POA: Insufficient documentation

## 2019-04-08 DIAGNOSIS — I5032 Chronic diastolic (congestive) heart failure: Secondary | ICD-10-CM | POA: Insufficient documentation

## 2019-04-08 DIAGNOSIS — Z950 Presence of cardiac pacemaker: Secondary | ICD-10-CM | POA: Insufficient documentation

## 2019-04-08 DIAGNOSIS — N179 Acute kidney failure, unspecified: Secondary | ICD-10-CM | POA: Diagnosis not present

## 2019-04-08 DIAGNOSIS — N189 Chronic kidney disease, unspecified: Secondary | ICD-10-CM | POA: Insufficient documentation

## 2019-04-08 DIAGNOSIS — I130 Hypertensive heart and chronic kidney disease with heart failure and stage 1 through stage 4 chronic kidney disease, or unspecified chronic kidney disease: Secondary | ICD-10-CM | POA: Insufficient documentation

## 2019-04-08 DIAGNOSIS — I10 Essential (primary) hypertension: Secondary | ICD-10-CM

## 2019-04-08 LAB — BASIC METABOLIC PANEL
Anion gap: 12 (ref 5–15)
BUN: 56 mg/dL — ABNORMAL HIGH (ref 8–23)
CO2: 27 mmol/L (ref 22–32)
Calcium: 9.2 mg/dL (ref 8.9–10.3)
Chloride: 103 mmol/L (ref 98–111)
Creatinine, Ser: 3.86 mg/dL — ABNORMAL HIGH (ref 0.44–1.00)
GFR calc Af Amer: 12 mL/min — ABNORMAL LOW (ref >60)
GFR calc non Af Amer: 11 mL/min — ABNORMAL LOW (ref >60)
Glucose, Bld: 91 mg/dL (ref 70–99)
Potassium: 5 mmol/L (ref 3.5–5.1)
Sodium: 142 mmol/L (ref 135–145)

## 2019-04-08 LAB — BRAIN NATRIURETIC PEPTIDE: B Natriuretic Peptide: 1654 pg/mL — ABNORMAL HIGH (ref 0.0–100.0)

## 2019-04-08 LAB — GLUCOSE, CAPILLARY: Glucose-Capillary: 86 mg/dL (ref 70–99)

## 2019-04-08 MED ORDER — FUROSEMIDE 10 MG/ML IJ SOLN
INTRAMUSCULAR | Status: AC
  Administered 2019-04-08: 80 mg via INTRAVENOUS
  Filled 2019-04-08: qty 8

## 2019-04-08 MED ORDER — POTASSIUM CHLORIDE CRYS ER 20 MEQ PO TBCR: 40.0000 meq | EXTENDED_RELEASE_TABLET | Freq: Once | ORAL | Status: AC

## 2019-04-08 MED ORDER — POTASSIUM CHLORIDE CRYS ER 20 MEQ PO TBCR
EXTENDED_RELEASE_TABLET | ORAL | Status: AC
  Administered 2019-04-08: 13:00:00 40 meq via ORAL
  Filled 2019-04-08: qty 2

## 2019-04-08 MED ORDER — SODIUM CHLORIDE FLUSH 0.9 % IV SOLN
INTRAVENOUS | Status: AC
  Filled 2019-04-08: qty 10

## 2019-04-08 MED ORDER — FUROSEMIDE 10 MG/ML IJ SOLN: 80.0000 mg | Freq: Once | INTRAMUSCULAR | Status: AC

## 2019-04-08 NOTE — Patient Instructions (Signed)
Continue weighing daily and call for an overnight weight gain of > 2 pounds or a weekly weight gain of >5 pounds. 

## 2019-04-10 ENCOUNTER — Inpatient Hospital Stay
Admission: EM | Admit: 2019-04-10 | Discharge: 2019-05-07 | DRG: 673 | Disposition: E | Payer: Medicare Other | Attending: Internal Medicine | Admitting: Internal Medicine

## 2019-04-10 ENCOUNTER — Encounter: Payer: Self-pay | Admitting: Emergency Medicine

## 2019-04-10 ENCOUNTER — Emergency Department: Payer: Medicare Other

## 2019-04-10 DIAGNOSIS — I10 Essential (primary) hypertension: Secondary | ICD-10-CM | POA: Diagnosis not present

## 2019-04-10 DIAGNOSIS — Z950 Presence of cardiac pacemaker: Secondary | ICD-10-CM | POA: Diagnosis not present

## 2019-04-10 DIAGNOSIS — N184 Chronic kidney disease, stage 4 (severe): Secondary | ICD-10-CM | POA: Diagnosis not present

## 2019-04-10 DIAGNOSIS — Z7982 Long term (current) use of aspirin: Secondary | ICD-10-CM | POA: Diagnosis not present

## 2019-04-10 DIAGNOSIS — E875 Hyperkalemia: Secondary | ICD-10-CM

## 2019-04-10 DIAGNOSIS — K922 Gastrointestinal hemorrhage, unspecified: Secondary | ICD-10-CM | POA: Diagnosis not present

## 2019-04-10 DIAGNOSIS — R531 Weakness: Secondary | ICD-10-CM | POA: Diagnosis not present

## 2019-04-10 DIAGNOSIS — I5033 Acute on chronic diastolic (congestive) heart failure: Secondary | ICD-10-CM | POA: Diagnosis present

## 2019-04-10 DIAGNOSIS — R001 Bradycardia, unspecified: Secondary | ICD-10-CM

## 2019-04-10 DIAGNOSIS — Z794 Long term (current) use of insulin: Secondary | ICD-10-CM

## 2019-04-10 DIAGNOSIS — M109 Gout, unspecified: Secondary | ICD-10-CM | POA: Diagnosis present

## 2019-04-10 DIAGNOSIS — N390 Urinary tract infection, site not specified: Secondary | ICD-10-CM | POA: Diagnosis present

## 2019-04-10 DIAGNOSIS — I272 Pulmonary hypertension, unspecified: Secondary | ICD-10-CM | POA: Diagnosis present

## 2019-04-10 DIAGNOSIS — E1122 Type 2 diabetes mellitus with diabetic chronic kidney disease: Secondary | ICD-10-CM | POA: Diagnosis present

## 2019-04-10 DIAGNOSIS — I4891 Unspecified atrial fibrillation: Secondary | ICD-10-CM | POA: Diagnosis not present

## 2019-04-10 DIAGNOSIS — Z66 Do not resuscitate: Secondary | ICD-10-CM | POA: Diagnosis not present

## 2019-04-10 DIAGNOSIS — E66813 Obesity, class 3: Secondary | ICD-10-CM

## 2019-04-10 DIAGNOSIS — G8929 Other chronic pain: Secondary | ICD-10-CM | POA: Diagnosis present

## 2019-04-10 DIAGNOSIS — J9600 Acute respiratory failure, unspecified whether with hypoxia or hypercapnia: Secondary | ICD-10-CM

## 2019-04-10 DIAGNOSIS — D62 Acute posthemorrhagic anemia: Secondary | ICD-10-CM | POA: Diagnosis not present

## 2019-04-10 DIAGNOSIS — Z79899 Other long term (current) drug therapy: Secondary | ICD-10-CM

## 2019-04-10 DIAGNOSIS — Z20822 Contact with and (suspected) exposure to covid-19: Secondary | ICD-10-CM | POA: Diagnosis present

## 2019-04-10 DIAGNOSIS — W19XXXA Unspecified fall, initial encounter: Secondary | ICD-10-CM

## 2019-04-10 DIAGNOSIS — G9341 Metabolic encephalopathy: Secondary | ICD-10-CM | POA: Diagnosis not present

## 2019-04-10 DIAGNOSIS — J9622 Acute and chronic respiratory failure with hypercapnia: Secondary | ICD-10-CM | POA: Diagnosis not present

## 2019-04-10 DIAGNOSIS — J9621 Acute and chronic respiratory failure with hypoxia: Secondary | ICD-10-CM | POA: Diagnosis not present

## 2019-04-10 DIAGNOSIS — Z6841 Body Mass Index (BMI) 40.0 and over, adult: Secondary | ICD-10-CM | POA: Diagnosis not present

## 2019-04-10 DIAGNOSIS — N179 Acute kidney failure, unspecified: Secondary | ICD-10-CM | POA: Diagnosis present

## 2019-04-10 DIAGNOSIS — Z992 Dependence on renal dialysis: Secondary | ICD-10-CM | POA: Diagnosis not present

## 2019-04-10 DIAGNOSIS — I5032 Chronic diastolic (congestive) heart failure: Secondary | ICD-10-CM | POA: Diagnosis not present

## 2019-04-10 DIAGNOSIS — N2581 Secondary hyperparathyroidism of renal origin: Secondary | ICD-10-CM | POA: Diagnosis present

## 2019-04-10 DIAGNOSIS — T502X5A Adverse effect of carbonic-anhydrase inhibitors, benzothiadiazides and other diuretics, initial encounter: Secondary | ICD-10-CM | POA: Diagnosis not present

## 2019-04-10 DIAGNOSIS — N186 End stage renal disease: Secondary | ICD-10-CM

## 2019-04-10 DIAGNOSIS — D631 Anemia in chronic kidney disease: Secondary | ICD-10-CM | POA: Diagnosis present

## 2019-04-10 DIAGNOSIS — R571 Hypovolemic shock: Secondary | ICD-10-CM | POA: Diagnosis not present

## 2019-04-10 DIAGNOSIS — R570 Cardiogenic shock: Secondary | ICD-10-CM | POA: Diagnosis not present

## 2019-04-10 DIAGNOSIS — I132 Hypertensive heart and chronic kidney disease with heart failure and with stage 5 chronic kidney disease, or end stage renal disease: Secondary | ICD-10-CM | POA: Diagnosis present

## 2019-04-10 DIAGNOSIS — E785 Hyperlipidemia, unspecified: Secondary | ICD-10-CM | POA: Diagnosis present

## 2019-04-10 DIAGNOSIS — I5031 Acute diastolic (congestive) heart failure: Secondary | ICD-10-CM | POA: Diagnosis not present

## 2019-04-10 DIAGNOSIS — M1711 Unilateral primary osteoarthritis, right knee: Secondary | ICD-10-CM | POA: Diagnosis present

## 2019-04-10 DIAGNOSIS — N3001 Acute cystitis with hematuria: Secondary | ICD-10-CM | POA: Diagnosis not present

## 2019-04-10 DIAGNOSIS — Z515 Encounter for palliative care: Secondary | ICD-10-CM | POA: Diagnosis not present

## 2019-04-10 LAB — CBC WITH DIFFERENTIAL/PLATELET
Abs Immature Granulocytes: 0.06 10*3/uL (ref 0.00–0.07)
Basophils Absolute: 0 10*3/uL (ref 0.0–0.1)
Basophils Relative: 0 %
Eosinophils Absolute: 0 10*3/uL (ref 0.0–0.5)
Eosinophils Relative: 0 %
HCT: 36.7 % (ref 36.0–46.0)
Hemoglobin: 12 g/dL (ref 12.0–15.0)
Immature Granulocytes: 1 %
Lymphocytes Relative: 11 %
Lymphs Abs: 0.9 10*3/uL (ref 0.7–4.0)
MCH: 29.9 pg (ref 26.0–34.0)
MCHC: 32.7 g/dL (ref 30.0–36.0)
MCV: 91.3 fL (ref 80.0–100.0)
Monocytes Absolute: 0.6 10*3/uL (ref 0.1–1.0)
Monocytes Relative: 8 %
Neutro Abs: 6.3 10*3/uL (ref 1.7–7.7)
Neutrophils Relative %: 80 %
Platelets: 218 10*3/uL (ref 150–400)
RBC: 4.02 MIL/uL (ref 3.87–5.11)
RDW: 16.9 % — ABNORMAL HIGH (ref 11.5–15.5)
WBC: 7.9 10*3/uL (ref 4.0–10.5)
nRBC: 1.4 % — ABNORMAL HIGH (ref 0.0–0.2)

## 2019-04-10 LAB — COMPREHENSIVE METABOLIC PANEL
ALT: 26 U/L (ref 0–44)
AST: 26 U/L (ref 15–41)
Albumin: 4.3 g/dL (ref 3.5–5.0)
Alkaline Phosphatase: 110 U/L (ref 38–126)
Anion gap: 11 (ref 5–15)
BUN: 68 mg/dL — ABNORMAL HIGH (ref 8–23)
CO2: 29 mmol/L (ref 22–32)
Calcium: 9.9 mg/dL (ref 8.9–10.3)
Chloride: 102 mmol/L (ref 98–111)
Creatinine, Ser: 4.85 mg/dL — ABNORMAL HIGH (ref 0.44–1.00)
GFR calc Af Amer: 9 mL/min — ABNORMAL LOW (ref >60)
GFR calc non Af Amer: 8 mL/min — ABNORMAL LOW (ref >60)
Glucose, Bld: 120 mg/dL — ABNORMAL HIGH (ref 70–99)
Potassium: 5.7 mmol/L — ABNORMAL HIGH (ref 3.5–5.1)
Sodium: 142 mmol/L (ref 135–145)
Total Bilirubin: 0.8 mg/dL (ref 0.3–1.2)
Total Protein: 7.3 g/dL (ref 6.5–8.1)

## 2019-04-10 LAB — CK: Total CK: 146 U/L (ref 38–234)

## 2019-04-10 LAB — URINALYSIS, COMPLETE (UACMP) WITH MICROSCOPIC
Bilirubin Urine: NEGATIVE
Glucose, UA: NEGATIVE mg/dL
Ketones, ur: NEGATIVE mg/dL
Nitrite: NEGATIVE
Protein, ur: 100 mg/dL — AB
RBC / HPF: >50 RBC/hpf — ABNORMAL HIGH (ref 0–5)
Specific Gravity, Urine: 1.012 (ref 1.005–1.030)
WBC, UA: >50 WBC/hpf — ABNORMAL HIGH (ref 0–5)
pH: 5 (ref 5.0–8.0)

## 2019-04-10 LAB — LACTIC ACID, PLASMA: Lactic Acid, Venous: 1.3 mmol/L (ref 0.5–1.9)

## 2019-04-10 LAB — BRAIN NATRIURETIC PEPTIDE: B Natriuretic Peptide: 1531 pg/mL — ABNORMAL HIGH (ref 0.0–100.0)

## 2019-04-10 LAB — TSH: TSH: 4.256 u[IU]/mL (ref 0.350–4.500)

## 2019-04-10 LAB — TROPONIN I (HIGH SENSITIVITY)
Troponin I (High Sensitivity): 37 ng/L — ABNORMAL HIGH (ref <18)
Troponin I (High Sensitivity): 32 ng/L — ABNORMAL HIGH (ref <18)

## 2019-04-10 MED ORDER — VITAMIN D 25 MCG (1000 UNIT) PO TABS
2000.0000 [IU] | ORAL_TABLET | Freq: Every day | ORAL | Status: DC
  Administered 2019-04-11 – 2019-04-20 (×9): 2000 [IU] via ORAL
  Filled 2019-04-10 (×9): qty 2

## 2019-04-10 MED ORDER — INSULIN ASPART 100 UNIT/ML ~~LOC~~ SOLN
0.0000 [IU] | Freq: Three times a day (TID) | SUBCUTANEOUS | Status: DC
  Administered 2019-04-11 – 2019-04-15 (×4): 2 [IU] via SUBCUTANEOUS
  Administered 2019-04-17 – 2019-04-20 (×5): 3 [IU] via SUBCUTANEOUS
  Filled 2019-04-10 (×7): qty 1

## 2019-04-10 MED ORDER — ONDANSETRON HCL 4 MG PO TABS: 4.0000 mg | ORAL_TABLET | Freq: Four times a day (QID) | ORAL | Status: DC | PRN

## 2019-04-10 MED ORDER — SODIUM CHLORIDE 0.9% FLUSH
3.0000 mL | Freq: Two times a day (BID) | INTRAVENOUS | Status: DC
  Administered 2019-04-11 – 2019-04-19 (×14): 3 mL via INTRAVENOUS

## 2019-04-10 MED ORDER — HYDROCODONE-ACETAMINOPHEN 5-325 MG PO TABS: 1.0000 | ORAL_TABLET | ORAL | Status: DC | PRN

## 2019-04-10 MED ORDER — ONDANSETRON HCL 4 MG/2ML IJ SOLN: 4.0000 mg | Freq: Four times a day (QID) | INTRAMUSCULAR | Status: DC | PRN

## 2019-04-10 MED ORDER — VITAMIN B-12 1000 MCG PO TABS
1000.0000 ug | ORAL_TABLET | Freq: Every day | ORAL | Status: DC
  Administered 2019-04-11 – 2019-04-20 (×10): 1000 ug via ORAL
  Filled 2019-04-10 (×9): qty 1

## 2019-04-10 MED ORDER — SODIUM CHLORIDE 0.9 % IV SOLN
1.0000 g | INTRAVENOUS | Status: DC
  Administered 2019-04-10: 1 g via INTRAVENOUS
  Filled 2019-04-10 (×2): qty 10

## 2019-04-10 MED ORDER — FUROSEMIDE 10 MG/ML IJ SOLN
40.0000 mg | Freq: Two times a day (BID) | INTRAMUSCULAR | Status: DC
  Administered 2019-04-13 – 2019-04-15 (×4): 40 mg via INTRAVENOUS
  Filled 2019-04-10 (×4): qty 4

## 2019-04-10 MED ORDER — SODIUM CHLORIDE 0.9 % IV SOLN: 250.0000 mL | INTRAVENOUS | Status: DC | PRN

## 2019-04-10 MED ORDER — PATIROMER SORBITEX CALCIUM 8.4 G PO PACK
8.4000 g | PACK | Freq: Every day | ORAL | Status: DC
  Administered 2019-04-11 – 2019-04-13 (×4): 8.4 g via ORAL
  Filled 2019-04-10 (×6): qty 1

## 2019-04-10 MED ORDER — HEPARIN SODIUM (PORCINE) 5000 UNIT/ML IJ SOLN
5000.0000 [IU] | Freq: Three times a day (TID) | INTRAMUSCULAR | Status: DC
  Administered 2019-04-10 – 2019-04-18 (×22): 5000 [IU] via SUBCUTANEOUS
  Filled 2019-04-10 (×24): qty 1

## 2019-04-10 MED ORDER — ATORVASTATIN CALCIUM 20 MG PO TABS
20.0000 mg | ORAL_TABLET | Freq: Every day | ORAL | Status: DC
  Administered 2019-04-11 – 2019-04-20 (×9): 20 mg via ORAL
  Filled 2019-04-10 (×9): qty 1

## 2019-04-10 MED ORDER — ALLOPURINOL 100 MG PO TABS
100.0000 mg | ORAL_TABLET | Freq: Every day | ORAL | Status: DC
  Administered 2019-04-11 – 2019-04-18 (×7): 100 mg via ORAL
  Filled 2019-04-10 (×10): qty 1

## 2019-04-10 MED ORDER — ACETAMINOPHEN 325 MG PO TABS: 650.0000 mg | ORAL_TABLET | Freq: Four times a day (QID) | ORAL | Status: DC | PRN

## 2019-04-10 MED ORDER — ACETAMINOPHEN 325 MG PO TABS: 650.0000 mg | ORAL_TABLET | ORAL | Status: DC | PRN

## 2019-04-10 MED ORDER — ALPRAZOLAM 0.25 MG PO TABS: 0.2500 mg | ORAL_TABLET | Freq: Two times a day (BID) | ORAL | Status: DC | PRN

## 2019-04-10 MED ORDER — ACETAMINOPHEN 650 MG RE SUPP: 650.0000 mg | Freq: Four times a day (QID) | RECTAL | Status: DC | PRN

## 2019-04-10 MED ORDER — SENNOSIDES-DOCUSATE SODIUM 8.6-50 MG PO TABS: 1.0000 | ORAL_TABLET | Freq: Every evening | ORAL | Status: DC | PRN

## 2019-04-10 MED ORDER — ONDANSETRON HCL 4 MG/2ML IJ SOLN
4.0000 mg | Freq: Four times a day (QID) | INTRAMUSCULAR | Status: DC | PRN
  Administered 2019-04-11: 20:00:00 4 mg via INTRAVENOUS
  Filled 2019-04-10: qty 2

## 2019-04-10 MED ORDER — ASPIRIN 81 MG PO CHEW
81.0000 mg | CHEWABLE_TABLET | Freq: Every day | ORAL | Status: DC
  Administered 2019-04-11 – 2019-04-19 (×8): 81 mg via ORAL
  Filled 2019-04-10 (×8): qty 1

## 2019-04-10 MED ORDER — SODIUM CHLORIDE 0.9% FLUSH: 3.0000 mL | INTRAVENOUS | Status: DC | PRN

## 2019-04-10 MED ORDER — FERROUS SULFATE 325 (65 FE) MG PO TABS
325.0000 mg | ORAL_TABLET | Freq: Two times a day (BID) | ORAL | Status: DC
  Administered 2019-04-11 – 2019-04-19 (×14): 325 mg via ORAL
  Filled 2019-04-10 (×15): qty 1

## 2019-04-10 MED ORDER — HYDRALAZINE HCL 50 MG PO TABS: 100.0000 mg | ORAL_TABLET | Freq: Three times a day (TID) | ORAL | Status: DC

## 2019-04-10 NOTE — ED Provider Notes (Signed)
[facility] Emergency Department Provider Note  ____________________________________________  Time seen: Approximately 4:01 PM  I have reviewed the triage vital signs and the nursing notes.   HISTORY  Chief Complaint Weakness and Fall    HPI Michele Beck is a 79 y.o. female who presents the emergency department with her son for a complaint weakness, fall, lower back pain.  Patient states that she was sitting in a chair last night, attempted to get up, slid into the floor.  Patient states that after falling into the floor she was too weak to stand and her son had to drive to [place] to get her out of the floor.  Patient states that she did not hit her head or lose consciousness.  She is complaining of lower back pain and bilateral hip pain.  According to the son patient is at her baseline mental status, however does appear significantly weak.  Patient denies any headache, visual changes, chest pain, shortness of breath, abdominal pain, nausea or vomiting.  She states that she has generalized weakness but no unilateral symptoms.  No difficulty formulating thoughts or words.  Patient has a history of arthritis, CHF, diabetes, hypertension.  Patient has had increased peripheral edema.  I saw the patient in the emergency department  a week ago for peripheral edema.  Patient is being seen in the congestive heart failure clinic over the past week.        Past Medical History:  Diagnosis Date  . Arthritis   . CHF (congestive heart failure) ([place])   . Diabetes mellitus without complication ([place])   . Hypertension   . Renal disorder     Patient Active Problem List   Diagnosis Date Noted  . UTI (urinary tract infection) 04/17/2019  . Generalized weakness 04/09/2019  . Fall at home, initial encounter 04/18/2019  . Obesity, Class III, BMI 40-49.9 (morbid obesity) ([place]) 05/05/2019  . CKD (chronic kidney disease) stage 4, GFR 15-29 ml/min (HCC) 04/12/2019  . Acute on  chronic diastolic (congestive) heart failure ([place]) 04/18/2019  . Hyperkalemia 04/18/2019  . Sinus bradycardia 04/09/2019  . Peripheral edema   . Pulmonary HTN ([place])   . Acute on chronic diastolic heart failure ([place])   . AKI (acute kidney injury) ([place])   . Acute CHF (congestive heart failure) ([place]) 01/22/2019  . Essential hypertension   . Chronic renal impairment   . Type 2 diabetes mellitus with stage 4 chronic kidney disease ([place])   . Acute on chronic congestive heart failure ([place]) 01/02/2019    Past Surgical History:  Procedure Laterality Date  . BACK SURGERY    . PACEMAKER PLACEMENT    . TUBAL LIGATION      Prior to Admission medications   Medication Sig Start Date End Date Taking? Authorizing Provider  allopurinol (ZYLOPRIM) 100 MG tablet Take 100 mg by mouth daily. 12/07/18  Yes [provider]  amLODipine (NORVASC) 5 MG tablet Take 5 mg by mouth daily. 11/02/18  Yes [provider]  aspirin 81 MG chewable tablet Chew 81 mg by mouth daily.   Yes [provider]  atorvastatin (LIPITOR) 20 MG tablet Take 20 mg by mouth daily. 12/27/18  Yes [provider]  Cholecalciferol (VITAMIN D-3) 25 MCG (1000 UT) CAPS Take 2 capsules by mouth daily.   Yes [provider]  ferrous sulfate 325 (65 FE) MG tablet Take 325 mg by mouth 2 (two) times daily. 10/04/18  Yes [provider]  hydrALAZINE (APRESOLINE) 100 MG tablet  Take 100 mg by mouth 3 (three) times daily. 10/04/18  Yes [provider]  LEVEMIR FLEXTOUCH 100 UNIT/ML Pen SMARTSIG:6 Unit(s) SUB-Q Every Night 07/20/18  Yes [provider]  metoprolol tartrate (LOPRESSOR) 100 MG tablet Take 100 mg by mouth 2 (two) times daily. 12/27/18  Yes [provider]  torsemide (DEMADEX) 20 MG tablet Take 2 tablets (40 mg total) by mouth daily. Patient taking differently: Take 40 mg by mouth 2 (two) times daily.  01/23/19  Yes Thornell Mule, MD  vitamin B-12  (CYANOCOBALAMIN) 1000 MCG tablet Take 1,000 mcg by mouth daily.   Yes [provider]    Allergies Patient has no known allergies.  History reviewed. No pertinent family history.  Social History Social History   Tobacco Use  . Smoking status: Never Smoker  . Smokeless tobacco: Never Used  Substance Use Topics  . Alcohol use: Never  . Drug use: Never     Review of Systems  Constitutional: No fever/chills.  Increasing generalized weakness. Eyes: No visual changes. No discharge ENT: No upper respiratory complaints. Cardiovascular: no chest pain. Respiratory: no cough. No SOB. Gastrointestinal: No abdominal pain.  No nausea, no vomiting.  No diarrhea.  No constipation. Genitourinary: Negative for dysuria. No hematuria Musculoskeletal: Low back pain, bilateral hip pain following a fall Skin: Negative for rash, abrasions, lacerations, ecchymosis. Neurological: Negative for headaches, focal weakness or numbness. 10-point ROS otherwise negative.  ____________________________________________   PHYSICAL EXAM:  VITAL SIGNS: ED Triage Vitals  Enc Vitals Group     BP 04/24/2019 1524 109/85     Pulse Rate 04/08/2019 1524 (!) 51     Resp 04/25/2019 1524 18     Temp 04/15/2019 1524 98.3 F (36.8 C)     Temp Source 04/24/2019 1524 Oral     SpO2 05/03/2019 1524 96 %     Weight 05/04/2019 1525 238 lb (108 kg)     Height 04/16/2019 1525 5\' 4"  (1.626 m)     Head Circumference --      Peak Flow --      Pain Score 04/24/2019 1525 0     Pain Loc --      Pain Edu? --      Excl. in [place]? --      Constitutional: Alert and oriented. Well appearing and in no acute distress. Eyes: Conjunctivae are normal. PERRL. EOMI. Head: Atraumatic. ENT:      Ears:       Nose: No congestion/rhinnorhea.      Mouth/Throat: Mucous membranes are moist.  Neck: No stridor.   Hematological/Lymphatic/Immunilogical: No cervical lymphadenopathy. Cardiovascular: Normal rate, regular rhythm. Normal S1 and S2.  Good  peripheral circulation. Respiratory: Normal respiratory effort without tachypnea or retractions. Lungs CTAB. Good air entry to the bases with no decreased or absent breath sounds. Gastrointestinal: Bowel sounds 4 quadrants. Soft and nontender to palpation. No guarding or rigidity. No palpable masses. No distention. No CVA tenderness. Musculoskeletal: Full range of motion to all extremities. No gross deformities appreciated.  No visible signs of trauma to the lumbar spine.  Patient is diffusely tender to palpation midline, bilateral paraspinal muscle regions extending into the SI joints bilaterally.  No palpable abnormality or step-off.  Reports no tenderness to bilateral hips on palpation.  No palpable abnormality about the hip joints.  No shortening or rotation identified on either lower extremity.  Examination of the knee, ankles bilaterally are unremarkable.  Dorsalis pedis pulses sensation intact bilaterally.  Peripheral edema is identified.  Nonpitting. Neurologic:  Normal speech and language. No gross focal neurologic deficits are appreciated.  Cranial nerves II to XII grossly intact. Skin:  Skin is warm, dry and intact. No rash noted. Psychiatric: Mood and affect are normal. Speech and behavior are normal. Patient exhibits appropriate insight and judgement.   ____________________________________________   LABS (all labs ordered are listed, but only abnormal results are displayed)  Labs Reviewed  COMPREHENSIVE METABOLIC PANEL - Abnormal; Notable for the following components:      Result Value   Potassium 5.7 (*)    Glucose, Bld 120 (*)    BUN 68 (*)    Creatinine, Ser 4.85 (*)    GFR calc non Af Amer 8 (*)    GFR calc Af Amer 9 (*)    All other components within normal limits  CBC WITH DIFFERENTIAL/PLATELET - Abnormal; Notable for the following components:   RDW 16.9 (*)    nRBC 1.4 (*)    All other components within normal limits  URINALYSIS, COMPLETE (UACMP) WITH MICROSCOPIC -  Abnormal; Notable for the following components:   Color, Urine YELLOW (*)    APPearance CLOUDY (*)    Hgb urine dipstick SMALL (*)    Protein, ur 100 (*)    Leukocytes,Ua MODERATE (*)    RBC / HPF >50 (*)    WBC, UA >50 (*)    Bacteria, UA MANY (*)    All other components within normal limits  BRAIN NATRIURETIC PEPTIDE - Abnormal; Notable for the following components:   B Natriuretic Peptide 1,531.0 (*)    All other components within normal limits  TROPONIN I (HIGH SENSITIVITY) - Abnormal; Notable for the following components:   Troponin I (High Sensitivity) 37 (*)    All other components within normal limits  TROPONIN I (HIGH SENSITIVITY) - Abnormal; Notable for the following components:   Troponin I (High Sensitivity) 32 (*)    All other components within normal limits  SARS CORONAVIRUS 2 (TAT 6-24 HRS)  LACTIC ACID, PLASMA  CK  TSH  BASIC METABOLIC PANEL  HEMOGLOBIN A1C   ____________________________________________  EKG   ____________________________________________  RADIOLOGY I personally viewed and evaluated these images as part of my medical decision making, as well as reviewing the written report by the radiologist.  DG Chest 1 View  Result Date: 04/13/2019 CLINICAL DATA:  Low back pain and bilateral hip pain. EXAM: CHEST  1 VIEW COMPARISON:  02/21/2019 FINDINGS: Stable positioning of single lead cardiac pacemaker. The cardiac silhouette is enlarged. Mediastinal contours appear intact. There is no evidence of focal airspace consolidation, pleural effusion or pneumothorax. Increased interstitial markings with central predominance. Osseous structures are without acute abnormality. Soft tissues are grossly normal. IMPRESSION: 1. Enlarged cardiac silhouette. 2. Increased interstitial markings with central predominance may represent pulmonary vascular congestion. Electronically Signed   By: Fidela Salisbury M.D.   On: 04/20/2019 17:33   DG Lumbar Spine 2-3 Views  Result  Date: 04/17/2019 CLINICAL DATA:  Post fall with low back and bilateral hip pain. Weakness for several days. Fall last night. Lumbosacral back pain, bilateral hip pain, and knee pain. Bilateral lower extremity swelling. EXAM: LUMBAR SPINE - 2-3 VIEW COMPARISON:  None. FINDINGS: Straightening of normal lordosis. No listhesis. Posterior rod and intrapedicular screw fusion at L4-L5, hardware is intact. Diffuse degenerative disc disease with disc space narrowing and endplate spurring. Facet hypertrophy at L2-L3 and L3-L4. Vertebral body heights are preserved. No acute fracture. Sacroiliac joints are congruent. IMPRESSION: 1. No evidence of  acute fracture of the lumbar spine. 2. Posterior fusion at L4-L5. Multilevel degenerative disc disease and facet hypertrophy. Electronically Signed   By: Keith Rake M.D.   On: 05/04/2019 17:34   DG Pelvis 1-2 Views  Result Date: 05/06/2019 CLINICAL DATA:  Post fall with low back and bilateral hip pain. Weakness for several days. Fall last night. Lumbosacral back pain, bilateral hip pain, and knee pain. Bilateral lower extremity swelling. EXAM: PELVIS - 1-2 VIEW COMPARISON:  None. FINDINGS: The cortical margins of the bony pelvis are intact. No fracture. Pubic symphysis and sacroiliac joints are congruent. Both femoral heads are well-seated in the respective acetabula. Age related degenerative change of both acetabular spurring. IMPRESSION: Negative for pelvic fracture. Electronically Signed   By: Keith Rake M.D.   On: 04/11/2019 17:35   DG Femur Min 2 Views Left  Result Date: 04/20/2019 CLINICAL DATA:  Post fall with low back and bilateral hip pain. Weakness for several days. Fall last night. Lumbosacral back pain, bilateral hip pain, and knee pain. Bilateral lower extremity swelling. EXAM: LEFT FEMUR 2 VIEWS COMPARISON:  None. FINDINGS: Cortical margins of the left femur are intact. There is no evidence of fracture or other focal bone lesions. Hip and knee alignment  are maintained. Curvilinear density adjacent to the distal medial femoral condyle is consistent with remote MCL injury. Diffuse soft tissue edema of the lower extremities nonspecific. There are vascular calcifications. IMPRESSION: 1. No acute fracture of the left femur. 2. Diffuse soft tissue edema, nonspecific. Electronically Signed   By: Keith Rake M.D.   On: 04/23/2019 17:36   DG Femur Min 2 Views Right  Result Date: 04/30/2019 CLINICAL DATA:  Post fall with low back and bilateral hip pain. Weakness for several days. Fall last night. Lumbosacral back pain, bilateral hip pain, and knee pain. Bilateral lower extremity swelling. EXAM: RIGHT FEMUR 2 VIEWS COMPARISON:  None. FINDINGS: Cortical margins of the right femur are intact. There is no evidence of fracture or other focal bone lesions. Hip and knee alignment are maintained. Moderate osteoarthritis of the knee. Generalized soft tissue edema is nonspecific. There are vascular calcifications. IMPRESSION: 1. No fracture of the right femur. 2. Nonspecific generalized soft tissue edema. Electronically Signed   By: Keith Rake M.D.   On: 04/17/2019 17:37    ____________________________________________    PROCEDURES  Procedure(s) performed:    Procedures    Medications  allopurinol (ZYLOPRIM) tablet 100 mg (has no administration in time range)  aspirin chewable tablet 81 mg (has no administration in time range)  atorvastatin (LIPITOR) tablet 20 mg (has no administration in time range)  ferrous sulfate tablet 325 mg (has no administration in time range)  vitamin B-12 (CYANOCOBALAMIN) tablet 1,000 mcg (has no administration in time range)  cholecalciferol (VITAMIN D3) tablet 2,000 Units (has no administration in time range)  cefTRIAXone (ROCEPHIN) 1 g in sodium chloride 0.9 % 100 mL IVPB (1 g Intravenous New Bag/Given 05/03/2019 2222)  sodium chloride flush (NS) 0.9 % injection 3 mL ( Intravenous Canceled Entry 04/11/2019 2217)  sodium  chloride flush (NS) 0.9 % injection 3 mL (has no administration in time range)  0.9 %  sodium chloride infusion (has no administration in time range)  acetaminophen (TYLENOL) tablet 650 mg (has no administration in time range)  ondansetron (ZOFRAN) injection 4 mg (has no administration in time range)  HYDROcodone-acetaminophen (NORCO/VICODIN) 5-325 MG per tablet 1-2 tablet (has no administration in time range)  senna-docusate (Senokot-S) tablet 1 tablet (has no  administration in time range)  ondansetron (ZOFRAN) tablet 4 mg (has no administration in time range)  heparin injection 5,000 Units (5,000 Units Subcutaneous Given 05/03/2019 2223)  furosemide (LASIX) injection 40 mg (has no administration in time range)  ALPRAZolam (XANAX) tablet 0.25 mg (has no administration in time range)  insulin aspart (novoLOG) injection 0-15 Units (has no administration in time range)  patiromer Daryll Drown) packet 8.4 g (has no administration in time range)     ____________________________________________   INITIAL IMPRESSION / ASSESSMENT AND PLAN / ED COURSE  Pertinent labs & imaging results that were available during my care of the patient were reviewed by me and considered in my medical decision making (see chart for details).  Review of the [place] CSRS was performed in accordance of the [place] prior to dispensing any controlled drugs.           Patient's diagnosis is consistent with weakness, fall, CHF, AKI, hyperkalemia.  Patient presented to the emergency department after a fall while at home.  Patient was in a chair, slid to the floor and was too weak to stand.  Patient was brought to the emergency department by her son.  Other than significant generalized weakness, increasing lower extremity edema, patient had mild musculoskeletal complaints following the fall.  Patient did not hit her head, no loss of consciousness.  Neurologically patient was intact.  Patient's exam was overall reassuring.  Patient did  have increased edema when compared with last week when I saw the patient.  Patient has been attending the CHF clinic and receiving IV doses of Lasix.  At this time, patient had an increase in her creatinine, moderate hyperkalemia but no findings on EKG to suggest arrhythmia, findings of worsening CHF even with aggressive outpatient management.  Given patient's deconditioning, worsening peripheral edema even in the setting of aggressive outpatient management, decreased appetite, worsening kidney function I felt that the patient require admission.  I discussed the patient with hospitalist service for admission and they agreed to admit.  They believe that patient does require further management of chronic medical issues as well as likely consultation with nephrology for possible aggressive treatment to include dialysis for worsening kidney function and hyperkalemia.  Patient care at this time will be transferred to the hospital service.     ____________________________________________  FINAL CLINICAL IMPRESSION(S) / ED DIAGNOSES  Final diagnoses:  Weakness  Fall, initial encounter  Chronic diastolic congestive heart failure (HCC)  AKI (acute kidney injury) ([place])  Hyperkalemia      NEW MEDICATIONS STARTED DURING THIS VISIT:  ED Discharge Orders    None          This chart was dictated using voice recognition software/Dragon. Despite best efforts to proofread, errors can occur which can change the meaning. Any change was purely unintentional.    Darletta Moll, PA-C 04/27/2019 2231    Nance Pear, MD 05/06/2019 [phone]

## 2019-04-10 NOTE — H&P (Signed)
History and Physical    Michele Beck RUE:[id number] DOB: Mar 06, 1940 DOA: 05/06/2019  PCP: System, Pcp Not In   Patient coming from: home I have personally briefly reviewed patient's old medical records in [place]  Chief Complaint: weakness and fall  HPI: Michele Beck is a 79 y.o. female with medical history significant for CKD 4, diastolic CHF, pulmonary HTN, DM 2, HTN, gout and obesity, hospitalized and January 2021 for heart failure exacerbation who presents to the emergency room following a fall at home with inability to get up on her own.  She said she was trying to push herself up out of the recliner to stand up but she just felt weak in the knees and slid down on the ground.  She stated down on the ground until her son came over to her house to help her up.  Patient has been feeling weak for the past couple weeks and was seen in the emergency room a week prior with concern for worsening lower extremity edema.  She was referred to the heart failure clinic and in the course of the past week received 3 IV infusions of Lasix.  She stated that she continued to feel very weak and had little appetite.  Today while at home, where she lives alone and where she usually manages independently, she slid out of the chair onto the floor.  She denies preceding chest pain, shortness of breath palpitations or preceding visual disturbance headache numbness weakness or tingling on one side of the body or face.  Has had no fever or chills.  ED Course: In the emergency room she had a soft blood pressure of 109/85 and was bradycardic with heart rate 44-51.  Other vitals WNL.  Troponin 37>>32, BNP 1,531, creatinine 4.85 above her baseline of 3.8, with potassium 5.7.  WBC 7.9 Hb 12.  EKG showed sinus bradycardia.  Urinalysis moderate leukocytes.  CXR showed pulmonary vascular congestion.  Trauma work-up negative for fracture.  Review of Systems: As per HPI otherwise 10 point review of systems negative.     Past Medical History:  Diagnosis Date  . Arthritis   . CHF (congestive heart failure) ([place])   . Diabetes mellitus without complication ([place])   . Hypertension   . Renal disorder     Past Surgical History:  Procedure Laterality Date  . BACK SURGERY    . PACEMAKER PLACEMENT    . TUBAL LIGATION       reports that she has never smoked. She has never used smokeless tobacco. She reports that she does not drink alcohol or use drugs.  No Known Allergies  History reviewed. No pertinent family history.   Prior to Admission medications   Medication Sig Start Date End Date Taking? Authorizing Provider  allopurinol (ZYLOPRIM) 100 MG tablet Take 100 mg by mouth daily. 12/07/18  Yes [provider]  amLODipine (NORVASC) 5 MG tablet Take 5 mg by mouth daily. 11/02/18  Yes [provider]  aspirin 81 MG chewable tablet Chew 81 mg by mouth daily.   Yes [provider]  atorvastatin (LIPITOR) 20 MG tablet Take 20 mg by mouth daily. 12/27/18  Yes [provider]  Cholecalciferol (VITAMIN D-3) 25 MCG (1000 UT) CAPS Take 2 capsules by mouth daily.   Yes [provider]  ferrous sulfate 325 (65 FE) MG tablet Take 325 mg by mouth 2 (two) times daily. 10/04/18  Yes [provider]  hydrALAZINE (APRESOLINE) 100 MG tablet Take 100 mg by mouth  3 (three) times daily. 10/04/18  Yes [provider]  LEVEMIR FLEXTOUCH 100 UNIT/ML Pen SMARTSIG:6 Unit(s) SUB-Q Every Night 07/20/18  Yes [provider]  metoprolol tartrate (LOPRESSOR) 100 MG tablet Take 100 mg by mouth 2 (two) times daily. 12/27/18  Yes [provider]  torsemide (DEMADEX) 20 MG tablet Take 2 tablets (40 mg total) by mouth daily. Patient taking differently: Take 40 mg by mouth 2 (two) times daily.  01/23/19  Yes Thornell Mule, MD  vitamin B-12 (CYANOCOBALAMIN) 1000 MCG tablet Take 1,000 mcg by mouth daily.   Yes [provider]    Physical Exam: Vitals:    04/12/2019 1525 04/24/2019 1618 05/03/2019 1645 04/13/2019 2009  BP:    110/80  Pulse:  (!) 49 (!) 49 (!) 50  Resp:  19 16 20   Temp:      TempSrc:      SpO2:  100% 96% 96%  Weight: 108 kg     Height: 5\' 4"  (1.626 m)        Vitals:   04/23/2019 1525 05/05/2019 1618 04/12/2019 1645 04/27/2019 2009  BP:    110/80  Pulse:  (!) 49 (!) 49 (!) 50  Resp:  19 16 20   Temp:      TempSrc:      SpO2:  100% 96% 96%  Weight: 108 kg     Height: 5\' 4"  (1.626 m)       Constitutional: Alert and awake, oriented x3, mild conversational dyspnea eyes: PERLA, EOMI, irises appear normal, anicteric sclera,  ENMT: external ears and nose appear normal, normal hearing             Lips appears normal, oropharynx mucosa, tongue, posterior pharynx appear normal  Neck: neck appears normal, no masses, normal ROM, no thyromegaly, no JVD  CVS: S1-S2 clear, no murmur rubs or gallops,  , no carotid bruits, pedal pulses palpable, 2+ pitting edema.  TED hose on Respiratory: Few bibasilar rales, no rhonchi. Respiratory effort normal. No accessory muscle use.  Abdomen: soft nontender, nondistended, normal bowel sounds, no hepatosplenomegaly, no hernias Musculoskeletal: : no cyanosis, clubbing , no contractures or atrophy Neuro: Cranial nerves II-XII intact, sensation, reflexes normal, strength Psych: judgement and insight appear normal, stable mood and affect,  Skin: no rashes or lesions or ulcers, no induration or nodules   Labs on Admission: I have personally reviewed following labs and imaging studies  CBC: Recent Labs  Lab 04/30/2019 1613  WBC 7.9  NEUTROABS 6.3  HGB 12.0  HCT 36.7  MCV 91.3  PLT 488   Basic Metabolic Panel: Recent Labs  Lab 04/05/19 1210 04/06/19 1205 04/08/19 1158 04/25/2019 1613  NA 140 143 142 142  K 4.0 4.7 5.0 5.7*  CL 102 107 103 102  CO2 25 25 27 29   GLUCOSE 131* 118* 91 120*  BUN 50* 53* 56* 68*  CREATININE 3.70* 3.25* 3.86* 4.85*  CALCIUM 8.8* 9.0 9.2 9.9   GFR: Estimated  Creatinine Clearance: 11.5 mL/min (A) (by C-G formula based on SCr of 4.85 mg/dL (H)). Liver Function Tests: Recent Labs  Lab 04/20/2019 1613  AST 26  ALT 26  ALKPHOS 110  BILITOT 0.8  PROT 7.3  ALBUMIN 4.3   No results for input(s): LIPASE, AMYLASE in the last 168 hours. No results for input(s): AMMONIA in the last 168 hours. Coagulation Profile: No results for input(s): INR, PROTIME in the last 168 hours. Cardiac Enzymes: Recent Labs  Lab 05/04/2019 1613  CKTOTAL 146  BNP (last 3 results) No results for input(s): PROBNP in the last 8760 hours. HbA1C: No results for input(s): HGBA1C in the last 72 hours. CBG: Recent Labs  Lab 04/05/19 1113 04/06/19 1112 04/08/19 1109  GLUCAP 158* 82 86   Lipid Profile: No results for input(s): CHOL, HDL, LDLCALC, TRIG, CHOLHDL, LDLDIRECT in the last 72 hours. Thyroid Function Tests: No results for input(s): TSH, T4TOTAL, FREET4, T3FREE, THYROIDAB in the last 72 hours. Anemia Panel: No results for input(s): VITAMINB12, FOLATE, FERRITIN, TIBC, IRON, RETICCTPCT in the last 72 hours. Urine analysis:    Component Value Date/Time   COLORURINE YELLOW (A) 04/19/2019 2008   APPEARANCEUR CLOUDY (A) 04/11/2019 2008   LABSPEC 1.012 04/22/2019 2008   PHURINE 5.0 04/09/2019 2008   GLUCOSEU NEGATIVE 04/15/2019 2008   HGBUR SMALL (A) 04/24/2019 2008   BILIRUBINUR NEGATIVE 04/13/2019 2008   [place] NEGATIVE 04/17/2019 2008   PROTEINUR 100 (A) 04/24/2019 2008   NITRITE NEGATIVE 04/30/2019 2008   LEUKOCYTESUR MODERATE (A) 04/17/2019 2008    Radiological Exams on Admission: DG Chest 1 View  Result Date: 05/01/2019 CLINICAL DATA:  Low back pain and bilateral hip pain. EXAM: CHEST  1 VIEW COMPARISON:  02/21/2019 FINDINGS: Stable positioning of single lead cardiac pacemaker. The cardiac silhouette is enlarged. Mediastinal contours appear intact. There is no evidence of focal airspace consolidation, pleural effusion or pneumothorax. Increased  interstitial markings with central predominance. Osseous structures are without acute abnormality. Soft tissues are grossly normal. IMPRESSION: 1. Enlarged cardiac silhouette. 2. Increased interstitial markings with central predominance may represent pulmonary vascular congestion. Electronically Signed   By: Fidela Salisbury M.D.   On: 04/22/2019 17:33   DG Lumbar Spine 2-3 Views  Result Date: 04/20/2019 CLINICAL DATA:  Post fall with low back and bilateral hip pain. Weakness for several days. Fall last night. Lumbosacral back pain, bilateral hip pain, and knee pain. Bilateral lower extremity swelling. EXAM: LUMBAR SPINE - 2-3 VIEW COMPARISON:  None. FINDINGS: Straightening of normal lordosis. No listhesis. Posterior rod and intrapedicular screw fusion at L4-L5, hardware is intact. Diffuse degenerative disc disease with disc space narrowing and endplate spurring. Facet hypertrophy at L2-L3 and L3-L4. Vertebral body heights are preserved. No acute fracture. Sacroiliac joints are congruent. IMPRESSION: 1. No evidence of acute fracture of the lumbar spine. 2. Posterior fusion at L4-L5. Multilevel degenerative disc disease and facet hypertrophy. Electronically Signed   By: Keith Rake M.D.   On: 04/22/2019 17:34   DG Pelvis 1-2 Views  Result Date: 04/12/2019 CLINICAL DATA:  Post fall with low back and bilateral hip pain. Weakness for several days. Fall last night. Lumbosacral back pain, bilateral hip pain, and knee pain. Bilateral lower extremity swelling. EXAM: PELVIS - 1-2 VIEW COMPARISON:  None. FINDINGS: The cortical margins of the bony pelvis are intact. No fracture. Pubic symphysis and sacroiliac joints are congruent. Both femoral heads are well-seated in the respective acetabula. Age related degenerative change of both acetabular spurring. IMPRESSION: Negative for pelvic fracture. Electronically Signed   By: Keith Rake M.D.   On: 05/03/2019 17:35   DG Femur Min 2 Views Left  Result Date:  04/11/2019 CLINICAL DATA:  Post fall with low back and bilateral hip pain. Weakness for several days. Fall last night. Lumbosacral back pain, bilateral hip pain, and knee pain. Bilateral lower extremity swelling. EXAM: LEFT FEMUR 2 VIEWS COMPARISON:  None. FINDINGS: Cortical margins of the left femur are intact. There is no evidence of fracture or other focal bone lesions. Hip and  knee alignment are maintained. Curvilinear density adjacent to the distal medial femoral condyle is consistent with remote MCL injury. Diffuse soft tissue edema of the lower extremities nonspecific. There are vascular calcifications. IMPRESSION: 1. No acute fracture of the left femur. 2. Diffuse soft tissue edema, nonspecific. Electronically Signed   By: Keith Rake M.D.   On: 05/05/2019 17:36   DG Femur Min 2 Views Right  Result Date: 04/22/2019 CLINICAL DATA:  Post fall with low back and bilateral hip pain. Weakness for several days. Fall last night. Lumbosacral back pain, bilateral hip pain, and knee pain. Bilateral lower extremity swelling. EXAM: RIGHT FEMUR 2 VIEWS COMPARISON:  None. FINDINGS: Cortical margins of the right femur are intact. There is no evidence of fracture or other focal bone lesions. Hip and knee alignment are maintained. Moderate osteoarthritis of the knee. Generalized soft tissue edema is nonspecific. There are vascular calcifications. IMPRESSION: 1. No fracture of the right femur. 2. Nonspecific generalized soft tissue edema. Electronically Signed   By: Keith Rake M.D.   On: 04/28/2019 17:37    EKG: Independently reviewed.   Assessment/Plan    UTI (urinary tract infection) -Urinalysis consistent with UTI -IV Rocephin pending culture results    Fluid overload   Acute on chronic diastolic heart failure ([place]) -Patient reports bilateral lower extremity edema not improving with outpatient IV diuretic at CHF clinic at the CHF clinic. -Chest x-ray showing pulmonary vascular congestion.  BNP  1500 but could be in part due to renal function -Repeat echocardiogram.  Last echo was in January showed EF 60% and grade 2 diastolic dysfunction -Daily weights, intake and output monitoring, salt and fluid restriction -IV Lasix with monitoring for worsening renal function -Holding beta-blocker due to bradycardia -No ACE inhibitor because of poor renal function    AKI (acute kidney injury) ([place])   CKD (chronic kidney disease) stage 4, GFR 15-29 ml/min (HCC)   Hyperkalemia -Creatinine 4.85 up from baseline of 3.8 and associated with hyperkalemia 5.7 -Worsening in part likely related to IV diuretic therapy at CHF clinic -Daily [place] -Patient appears to be nearing end-stage, with a GFR of 9 -Consult nephrology -Avoid nephrotoxins.  No ACE inhibitor/ARB    Sinus bradycardia -Hold home metoprolol -Continuous cardiac monitoring    Generalized weakness   Fall at home, initial encounter  Obesity, Class III, BMI 40-49.9 (morbid obesity) ([place]) -Secondary to above acute problems -Morbid obesity a complicating factor -PT OT consult when improved    Type 2 diabetes mellitus with stage 4 chronic kidney disease (HCC) -Sliding scale only for now     Essential hypertension -Holding amlodipine and hydralazine due to soft blood pressures    Pulmonary HTN ([place]) -No acute concerns      DVT prophylaxis: heparin  Code Status: full code  Family Communication:  none  Disposition Plan: Back to previous home environment Consults called: Nephrology Status:inp    Athena Masse MD [facility]     05/05/2019, 9:01 PM

## 2019-04-10 NOTE — ED Notes (Signed)
Pt ambulatory with maximum assistance to the bathroom at this time.

## 2019-04-10 NOTE — ED Notes (Signed)
Admitting Provider at bedside. 

## 2019-04-10 NOTE — ED Triage Notes (Signed)
Pt to triage with c/o being weak for several days and had a fall last night.  Son states he had to go to her home to get her out of the floor.  Also report "trouble with fluid" and that she does not have an appetite.

## 2019-04-11 ENCOUNTER — Encounter: Payer: Self-pay | Admitting: Internal Medicine

## 2019-04-11 ENCOUNTER — Other Ambulatory Visit: Payer: Self-pay

## 2019-04-11 ENCOUNTER — Inpatient Hospital Stay (HOSPITAL_COMMUNITY)
Admit: 2019-04-11 | Discharge: 2019-04-11 | Disposition: A | Discharge status: Home / Self Care | Payer: Medicare Other | Attending: Internal Medicine | Admitting: Internal Medicine

## 2019-04-11 DIAGNOSIS — I5031 Acute diastolic (congestive) heart failure: Secondary | ICD-10-CM

## 2019-04-11 DIAGNOSIS — I10 Essential (primary) hypertension: Secondary | ICD-10-CM

## 2019-04-11 DIAGNOSIS — R001 Bradycardia, unspecified: Secondary | ICD-10-CM

## 2019-04-11 DIAGNOSIS — N3001 Acute cystitis with hematuria: Secondary | ICD-10-CM

## 2019-04-11 DIAGNOSIS — R531 Weakness: Secondary | ICD-10-CM

## 2019-04-11 LAB — BASIC METABOLIC PANEL
Anion gap: 12 (ref 5–15)
BUN: 72 mg/dL — ABNORMAL HIGH (ref 8–23)
CO2: 25 mmol/L (ref 22–32)
Calcium: 9.2 mg/dL (ref 8.9–10.3)
Chloride: 103 mmol/L (ref 98–111)
Creatinine, Ser: 5.04 mg/dL — ABNORMAL HIGH (ref 0.44–1.00)
GFR calc Af Amer: 9 mL/min — ABNORMAL LOW (ref >60)
GFR calc non Af Amer: 8 mL/min — ABNORMAL LOW (ref >60)
Glucose, Bld: 149 mg/dL — ABNORMAL HIGH (ref 70–99)
Potassium: 5.7 mmol/L — ABNORMAL HIGH (ref 3.5–5.1)
Sodium: 140 mmol/L (ref 135–145)

## 2019-04-11 LAB — HEMOGLOBIN A1C
Hgb A1c MFr Bld: 6.5 % — ABNORMAL HIGH (ref 4.8–5.6)
Mean Plasma Glucose: 139.85 mg/dL

## 2019-04-11 LAB — GLUCOSE, CAPILLARY
Glucose-Capillary: 116 mg/dL — ABNORMAL HIGH (ref 70–99)
Glucose-Capillary: 130 mg/dL — ABNORMAL HIGH (ref 70–99)
Glucose-Capillary: 132 mg/dL — ABNORMAL HIGH (ref 70–99)
Glucose-Capillary: 148 mg/dL — ABNORMAL HIGH (ref 70–99)
Glucose-Capillary: 98 mg/dL (ref 70–99)

## 2019-04-11 LAB — SARS CORONAVIRUS 2 (TAT 6-24 HRS): SARS Coronavirus 2: NEGATIVE

## 2019-04-11 LAB — PROTEIN / CREATININE RATIO, URINE
Creatinine, Urine: 117 mg/dL
Protein Creatinine Ratio: 0.73 mg/mg{Cre} — ABNORMAL HIGH (ref 0.00–0.15)
Total Protein, Urine: 85 mg/dL

## 2019-04-11 NOTE — Progress Notes (Signed)
*  PRELIMINARY RESULTS* Echocardiogram 2D Echocardiogram has been performed.  Michele Beck 04/11/2019, 8:21 PM

## 2019-04-11 NOTE — Progress Notes (Signed)
[facility]  ROUNDING NOTE   Subjective:   Ms. Giavonni Cizek is admitted to [facility] on 04/19/2019 for Hyperkalemia [E87.5] Weakness [E99.3] Chronic diastolic congestive heart failure ([place]) [I50.32] AKI (acute kidney injury) ([place]) [N17.9] Fall, initial encounter [W19.XXXA] Acute on chronic diastolic (congestive) heart failure ([place]) [I50.33]  Patient states she is having a poor appetite and dysgeusia   Objective:  Vital signs in last 24 hours:  Temp:  [97.4 F (36.3 C)-98.2 F (36.8 C)] 98.2 F (36.8 C) (04/05 1624) Pulse Rate:  [48-70] 50 (04/05 1624) Resp:  [15-23] 19 (04/05 1624) BP: (102-130)/(51-81) 107/58 (04/05 1624) SpO2:  [93 %-99 %] 97 % (04/05 1624) Weight:  [107 kg] 107 kg (04/05 0234)  Weight change:  Filed Weights   05/02/2019 1525 04/11/19 0234  Weight: 108 kg 107 kg    Intake/Output: No intake/output data recorded.   Intake/Output this shift:  Total I/O In: 580 [P.O.:480; IV Piggyback:100] Out: -   Physical Exam: General: NAD,   Head: Normocephalic, atraumatic. Moist oral mucosal membranes  Eyes: Anicteric, PERRL  Neck: Supple, trachea midline  Lungs:  Bilateral crackles  Heart: Regular rate and rhythm  Abdomen:  Soft, nontender,   Extremities:  ++ peripheral edema.  Neurologic: Nonfocal, moving all four extremities  Skin: No lesions  Access: none    Basic Metabolic Panel: Recent Labs  Lab 04/05/19 1210 04/05/19 1210 04/06/19 1205 04/06/19 1205 04/08/19 1158 04/15/2019 1613 04/11/19 0508  NA 140  --  143  --  142 142 140  K 4.0  --  4.7  --  5.0 5.7* 5.7*  CL 102  --  107  --  103 102 103  CO2 25  --  25  --  27 29 25   GLUCOSE 131*  --  118*  --  91 120* 149*  BUN 50*  --  53*  --  56* 68* 72*  CREATININE 3.70*  --  3.25*  --  3.86* 4.85* 5.04*  CALCIUM 8.8*   < > 9.0   < > 9.2 9.9 9.2   < > = values in this interval not displayed.    Liver Function Tests: Recent Labs  Lab 04/09/2019 1613  AST 26  ALT 26  ALKPHOS 110   BILITOT 0.8  PROT 7.3  ALBUMIN 4.3   No results for input(s): LIPASE, AMYLASE in the last 168 hours. No results for input(s): AMMONIA in the last 168 hours.  CBC: Recent Labs  Lab 04/30/2019 1613  WBC 7.9  NEUTROABS 6.3  HGB 12.0  HCT 36.7  MCV 91.3  PLT 218    Cardiac Enzymes: Recent Labs  Lab 05/06/2019 1613  CKTOTAL 146    BNP: Invalid input(s): POCBNP  CBG: Recent Labs  Lab 04/06/19 1112 04/08/19 1109 04/11/19 0050 04/11/19 0915 04/11/19 1206  GLUCAP 82 86 132* 116* 148*    Microbiology: Results for orders placed or performed during the hospital encounter of 04/09/2019  SARS CORONAVIRUS 2 (TAT 6-24 HRS) Nasopharyngeal Nasopharyngeal Swab     Status: None   Collection Time: 04/08/2019  9:28 PM   Specimen: Nasopharyngeal Swab  Result Value Ref Range Status   SARS Coronavirus 2 NEGATIVE NEGATIVE Final    Comment: (NOTE) SARS-CoV-2 target nucleic acids are NOT DETECTED. The SARS-CoV-2 RNA is generally detectable in upper and lower respiratory specimens during the acute phase of infection. Negative results do not preclude SARS-CoV-2 infection, do not rule out co-infections with other pathogens, and should not be used as  the sole basis for treatment or other patient management decisions. Negative results must be combined with clinical observations, patient history, and epidemiological information. The expected result is Negative. Fact Sheet for Patients: SugarRoll.be Fact Sheet for Healthcare Providers: https://www.woods-mathews.com/ This test is not yet approved or cleared by the [place] FDA and  has been authorized for detection and/or diagnosis of SARS-CoV-2 by FDA under an Emergency Use Authorization (EUA). This EUA will remain  in effect (meaning this test can be used) for the duration of the COVID-19 declaration under Section 56 4(b)(1) of the Act, 21 U.S.C. section 360bbb-3(b)(1), unless the authorization  is terminated or revoked sooner. Performed at [facility], [place] [address] [place], [place] [address]     Coagulation Studies: No results for input(s): LABPROT, INR in the last 72 hours.  Urinalysis: Recent Labs    05/04/2019 2008  COLORURINE YELLOW*  LABSPEC 1.012  PHURINE 5.0  GLUCOSEU NEGATIVE  HGBUR SMALL*  BILIRUBINUR NEGATIVE  KETONESUR NEGATIVE  PROTEINUR 100*  NITRITE NEGATIVE  LEUKOCYTESUR MODERATE*      Imaging: DG Chest 1 View  Result Date: 04/27/2019 CLINICAL DATA:  Low back pain and bilateral hip pain. EXAM: CHEST  1 VIEW COMPARISON:  02/21/2019 FINDINGS: Stable positioning of single lead cardiac pacemaker. The cardiac silhouette is enlarged. Mediastinal contours appear intact. There is no evidence of focal airspace consolidation, pleural effusion or pneumothorax. Increased interstitial markings with central predominance. Osseous structures are without acute abnormality. Soft tissues are grossly normal. IMPRESSION: 1. Enlarged cardiac silhouette. 2. Increased interstitial markings with central predominance may represent pulmonary vascular congestion. Electronically Signed   By: Fidela Salisbury M.D.   On: 04/27/2019 17:33   DG Lumbar Spine 2-3 Views  Result Date: 04/27/2019 CLINICAL DATA:  Post fall with low back and bilateral hip pain. Weakness for several days. Fall last night. Lumbosacral back pain, bilateral hip pain, and knee pain. Bilateral lower extremity swelling. EXAM: LUMBAR SPINE - 2-3 VIEW COMPARISON:  None. FINDINGS: Straightening of normal lordosis. No listhesis. Posterior rod and intrapedicular screw fusion at L4-L5, hardware is intact. Diffuse degenerative disc disease with disc space narrowing and endplate spurring. Facet hypertrophy at L2-L3 and L3-L4. Vertebral body heights are preserved. No acute fracture. Sacroiliac joints are congruent. IMPRESSION: 1. No evidence of acute fracture of the lumbar spine. 2. Posterior fusion at L4-L5. Multilevel  degenerative disc disease and facet hypertrophy. Electronically Signed   By: Keith Rake M.D.   On: 04/17/2019 17:34   DG Pelvis 1-2 Views  Result Date: 04/25/2019 CLINICAL DATA:  Post fall with low back and bilateral hip pain. Weakness for several days. Fall last night. Lumbosacral back pain, bilateral hip pain, and knee pain. Bilateral lower extremity swelling. EXAM: PELVIS - 1-2 VIEW COMPARISON:  None. FINDINGS: The cortical margins of the bony pelvis are intact. No fracture. Pubic symphysis and sacroiliac joints are congruent. Both femoral heads are well-seated in the respective acetabula. Age related degenerative change of both acetabular spurring. IMPRESSION: Negative for pelvic fracture. Electronically Signed   By: Keith Rake M.D.   On: 04/20/2019 17:35   DG Femur Min 2 Views Left  Result Date: 04/28/2019 CLINICAL DATA:  Post fall with low back and bilateral hip pain. Weakness for several days. Fall last night. Lumbosacral back pain, bilateral hip pain, and knee pain. Bilateral lower extremity swelling. EXAM: LEFT FEMUR 2 VIEWS COMPARISON:  None. FINDINGS: Cortical margins of the left femur are intact. There is no evidence of fracture or other  focal bone lesions. Hip and knee alignment are maintained. Curvilinear density adjacent to the distal medial femoral condyle is consistent with remote MCL injury. Diffuse soft tissue edema of the lower extremities nonspecific. There are vascular calcifications. IMPRESSION: 1. No acute fracture of the left femur. 2. Diffuse soft tissue edema, nonspecific. Electronically Signed   By: Keith Rake M.D.   On: 04/13/2019 17:36   DG Femur Min 2 Views Right  Result Date: 04/27/2019 CLINICAL DATA:  Post fall with low back and bilateral hip pain. Weakness for several days. Fall last night. Lumbosacral back pain, bilateral hip pain, and knee pain. Bilateral lower extremity swelling. EXAM: RIGHT FEMUR 2 VIEWS COMPARISON:  None. FINDINGS: Cortical margins of  the right femur are intact. There is no evidence of fracture or other focal bone lesions. Hip and knee alignment are maintained. Moderate osteoarthritis of the knee. Generalized soft tissue edema is nonspecific. There are vascular calcifications. IMPRESSION: 1. No fracture of the right femur. 2. Nonspecific generalized soft tissue edema. Electronically Signed   By: Keith Rake M.D.   On: 04/17/2019 17:37     Medications:   . sodium chloride     . allopurinol  100 mg Oral Daily  . aspirin  81 mg Oral Daily  . atorvastatin  20 mg Oral Daily  . cholecalciferol  2,000 Units Oral Daily  . ferrous sulfate  325 mg Oral BID  . furosemide  40 mg Intravenous BID  . heparin  5,000 Units Subcutaneous Q8H  . insulin aspart  0-15 Units Subcutaneous TID WC  . patiromer  8.4 g Oral Daily  . sodium chloride flush  3 mL Intravenous Q12H  . vitamin B-12  1,000 mcg Oral Daily   sodium chloride, acetaminophen, ALPRAZolam, HYDROcodone-acetaminophen, ondansetron (ZOFRAN) IV, ondansetron **OR** [DISCONTINUED] ondansetron (ZOFRAN) IV, senna-docusate, sodium chloride flush  Assessment/ Plan:  Ms. Ayaana Biondo is a 79 y.o. black female with hypertension, diabetes mellitus type II, congestive heart failure, gout, hyperlipidemia, pacemaker, back surgery who is admitted to [facility] on 04/26/2019 for Hyperkalemia [E87.5] Weakness [Q59.5] Chronic diastolic congestive heart failure ([place]) [I50.32] AKI (acute kidney injury) ([place]) [N17.9] Fall, initial encounter [W19.XXXA] Acute on chronic diastolic (congestive) heart failure ([place]) [I50.33]  1. Acute kidney injury with hyperkalemia on chronic kidney disease stage GL:OVFIEPPI by Dr. Jodell Cipro [facility]).  Baseline creatinine of 2.56, GFR of 17 on 01/23/2019.  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  Now with uremic symptoms. Patient may need dialysis soon.  - patiromer  2.  Hypertension: with acute exacerbation of diastolic congestive heart failure. Home regimen of amlodipine, metoprolol, torsemide and hydralazine.  Blood pressure is soft at 107/58. Home regimen held.  - IV furosemide  3. Diabetes mellitus type II with chronic kidney disease: hemoglobin A1c of 6.5%. Suspect this is low due to renal insufficiency.    LOS: 1 Makari Portman 4/5/20214:50 PM

## 2019-04-11 NOTE — Progress Notes (Signed)
Alerted Dr. Mal Misty that patient's BP is still soft at 107/58 and HR is 46. MD order to hold lasix. No other orders. Will continue to monitor.

## 2019-04-11 NOTE — Progress Notes (Signed)
OT Cancellation Note  Patient Details Name: Michele Beck MRN: [id number] DOB: [birth year]   Cancelled Treatment:    Reason Eval/Treat Not Completed: Medical issues which prohibited therapy. OT consult received, chart reviewed.  Pt's potassium noted to be elevated at 5.7 yesterday afternoon and this morning.  Per OT guidelines for elevated potassium, exertional activity currently contra-indicated.  Will hold OT at this time and re-attempt OT evaluation at a later date/time as medically appropriate.  Jeni Salles, MPH, MS, OTR/L ascom [phone] 04/11/19, 1:15 PM

## 2019-04-11 NOTE — Progress Notes (Signed)
PT Cancellation Note  Patient Details Name: Michele Beck MRN: [id number] DOB: 1940/10/14   Cancelled Treatment:    Reason Eval/Treat Not Completed: Patient not medically ready.  PT consult received.  Chart reviewed.  Pt's potassium noted to be elevated at 5.7 yesterday afternoon and this morning.  Per PT guidelines for elevated potassium, exertional activity currently contra-indicated.  Will hold PT at this time and re-attempt PT evaluation at a later date/time as medically appropriate.  Leitha Bleak, PT 04/11/19, 12:18 PM

## 2019-04-11 NOTE — Progress Notes (Addendum)
Progress Note    Michele Beck  UXL:[id number] DOB: June 18, 1940  DOA: 04/29/2019 PCP: System, Pcp Not In      Brief Narrative:    Medical records reviewed and are as summarized below:  Michele Beck is an 79 y.o. female with past medical history significant for CKD 4, diastolic CHF, pulmonary HTN, DM 2, HTN, gout and obesity, hospitalized and January 2021 for heart failure exacerbation.  Patient has been feeling weak for some time now and she was seen in the emergency room 04/02/2019 with concern for worsening lower extremity edema.  She was referred to the heart failure clinic where she was treated with IV Lasix.  She presented to the emergency room following a fall at home. She was unable to to get up on her own.  She said she was trying to push herself up out of the recliner to stand up but she just felt weak in the knees and slid down on the ground.  She stayed down on the ground until her son came over to her house to help her up.   Urinalysis was abnormal concerning for UTI.  Creatinine was also elevated above her baseline.  She was admitted to the hospital for suspected UTI, acute on chronic diastolic CHF and AKI.    Assessment/Plan:   Principal Problem:   UTI (urinary tract infection) Active Problems:   Type 2 diabetes mellitus with stage 4 chronic kidney disease (HCC)   Essential hypertension   Pulmonary HTN (HCC)   Acute on chronic diastolic heart failure (HCC)   AKI (acute kidney injury) ([place])   Generalized weakness   Fall at home, initial encounter   Obesity, Class III, BMI 40-49.9 (morbid obesity) (HCC)   CKD (chronic kidney disease) stage 4, GFR 15-29 ml/min (HCC)   Acute on chronic diastolic (congestive) heart failure (HCC)   Hyperkalemia   Sinus bradycardia   UTI (urinary tract infection) -Abnormal urinalysis concerning for UTI -IV Rocephin pending culture results    Fluid overload   Acute on chronic diastolic heart failure ([place]), severe pulmonary  hypertension IV Lasix has been held because of worsening AKI Monitor daily weights, BMP and urine output 2D echo is pending    AKI (acute kidney injury) ([place])  Likely progressed to CKD (chronic kidney disease) stage 5   Hyperkalemia IV Lasix has been held.  Continue Veltassa. Consult nephrology Avoid nephrotoxins.  No ACE inhibitor/ARB    Sinus bradycardia Asymptomatic.  Hold metoprolol. Monitor on telemetry    Generalized weakness, right knee arthritis   Fall at home, subsequent encounter  Obesity, Class III, BMI 40-49.9 (morbid obesity) ([place]) Morbid obesity a complicating factor PT and OT evaluation    Type 2 diabetes mellitus  Insulin sliding scale as needed  Essential hypertension -Holding amlodipine and hydralazine due to soft blood pressures       Body mass index is 41.81 kg/m.  (Morbid obesity)  Family Communication/Anticipated D/C date and plan/Code Status   DVT prophylaxis: Heparin Code Status: Full code Family Communication: Plan discussed with the patient  Disposition Plan: Plan is from home.  Disposition to be determined pending PT and OT evaluation     Subjective:   She said she slid out of recliner and fell at home. Her gait is unsteady. She has chronic low back pain and right knee swelling.  She has increased frequency of micturition because she was using Lasix at home.  No abdominal pain, dysuria, fever or chills  Objective:  Vitals:   04/11/19 0200 04/11/19 0234 04/11/19 0438 04/11/19 0730  BP: 117/60 (!) 130/56 118/72 (!) 123/51  Pulse: (!) 49 (!) 52 (!) 50 (!) 50  Resp: 19 19 18 18   Temp:  (!) 97.4 F (36.3 C)  98 F (36.7 C)  TempSrc:  Oral    SpO2: 96% 96% 95% 93%  Weight:  107 kg    Height:  5\' 3"  (1.6 m)     No intake or output data in the 24 hours ending 04/11/19 0909 Filed Weights   05/03/2019 1525 04/11/19 0234  Weight: 108 kg 107 kg    Exam:  GEN: NAD SKIN: No rash EYES: EOMI ENT: MMM CV: RRR PULM: CTA  B ABD: soft, obese, NT, +BS CNS: AAO x 3, non focal EXT: B/l leg edema, no tenderness MSK: Chronic right knee swelling. No tenderness or erythema   Data Reviewed:   I have personally reviewed following labs and imaging studies:  Labs: Labs show the following:   Basic Metabolic Panel: Recent Labs  Lab 04/05/19 1210 04/05/19 1210 04/06/19 1205 04/06/19 1205 04/08/19 1158 04/08/19 1158 05/03/2019 1613 04/11/19 0508  NA 140  --  143  --  142  --  142 140  K 4.0   < > 4.7   < > 5.0   < > 5.7* 5.7*  CL 102  --  107  --  103  --  102 103  CO2 25  --  25  --  27  --  29 25  GLUCOSE 131*  --  118*  --  91  --  120* 149*  BUN 50*  --  53*  --  56*  --  68* 72*  CREATININE 3.70*  --  3.25*  --  3.86*  --  4.85* 5.04*  CALCIUM 8.8*  --  9.0  --  9.2  --  9.9 9.2   < > = values in this interval not displayed.   GFR Estimated Creatinine Clearance: 10.8 mL/min (A) (by C-G formula based on SCr of 5.04 mg/dL (H)). Liver Function Tests: Recent Labs  Lab 05/01/2019 1613  AST 26  ALT 26  ALKPHOS 110  BILITOT 0.8  PROT 7.3  ALBUMIN 4.3   No results for input(s): LIPASE, AMYLASE in the last 168 hours. No results for input(s): AMMONIA in the last 168 hours. Coagulation profile No results for input(s): INR, PROTIME in the last 168 hours.  CBC: Recent Labs  Lab 04/27/2019 1613  WBC 7.9  NEUTROABS 6.3  HGB 12.0  HCT 36.7  MCV 91.3  PLT 218   Cardiac Enzymes: Recent Labs  Lab 04/25/2019 1613  CKTOTAL 146   BNP (last 3 results) No results for input(s): PROBNP in the last 8760 hours. CBG: Recent Labs  Lab 04/05/19 1113 04/06/19 1112 04/08/19 1109 04/11/19 0050  GLUCAP 158* 82 86 132*   D-Dimer: No results for input(s): DDIMER in the last 72 hours. Hgb A1c: Recent Labs    04/17/2019 2128  HGBA1C 6.5*   Lipid Profile: No results for input(s): CHOL, HDL, LDLCALC, TRIG, CHOLHDL, LDLDIRECT in the last 72 hours. Thyroid function studies: Recent Labs    04/12/2019 1818   TSH 4.256   Anemia work up: No results for input(s): VITAMINB12, FOLATE, FERRITIN, TIBC, IRON, RETICCTPCT in the last 72 hours. Sepsis Labs: Recent Labs  Lab 04/22/2019 1613  WBC 7.9  LATICACIDVEN 1.3    Microbiology Recent Results (from the past 240 hour(s))  SARS CORONAVIRUS  2 (TAT 6-24 HRS) Nasopharyngeal Nasopharyngeal Swab     Status: None   Collection Time: 04/02/19  4:51 PM   Specimen: Nasopharyngeal Swab  Result Value Ref Range Status   SARS Coronavirus 2 NEGATIVE NEGATIVE Final    Comment: (NOTE) SARS-CoV-2 target nucleic acids are NOT DETECTED. The SARS-CoV-2 RNA is generally detectable in upper and lower respiratory specimens during the acute phase of infection. Negative results do not preclude SARS-CoV-2 infection, do not rule out co-infections with other pathogens, and should not be used as the sole basis for treatment or other patient management decisions. Negative results must be combined with clinical observations, patient history, and epidemiological information. The expected result is Negative. Fact Sheet for Patients: SugarRoll.be Fact Sheet for Healthcare Providers: https://www.woods-mathews.com/ This test is not yet approved or cleared by the [place] FDA and  has been authorized for detection and/or diagnosis of SARS-CoV-2 by FDA under an Emergency Use Authorization (EUA). This EUA will remain  in effect (meaning this test can be used) for the duration of the COVID-19 declaration under Section 56 4(b)(1) of the Act, 21 U.S.C. section 360bbb-3(b)(1), unless the authorization is terminated or revoked sooner. Performed at [facility], [place] [address], [place], [place] [address]     Procedures and diagnostic studies:  DG Chest 1 View  Result Date: 04/07/2019 CLINICAL DATA:  Low back pain and bilateral hip pain. EXAM: CHEST  1 VIEW COMPARISON:  02/21/2019 FINDINGS: Stable positioning of single lead  cardiac pacemaker. The cardiac silhouette is enlarged. Mediastinal contours appear intact. There is no evidence of focal airspace consolidation, pleural effusion or pneumothorax. Increased interstitial markings with central predominance. Osseous structures are without acute abnormality. Soft tissues are grossly normal. IMPRESSION: 1. Enlarged cardiac silhouette. 2. Increased interstitial markings with central predominance may represent pulmonary vascular congestion. Electronically Signed   By: Fidela Salisbury M.D.   On: 04/11/2019 17:33   DG Lumbar Spine 2-3 Views  Result Date: 04/26/2019 CLINICAL DATA:  Post fall with low back and bilateral hip pain. Weakness for several days. Fall last night. Lumbosacral back pain, bilateral hip pain, and knee pain. Bilateral lower extremity swelling. EXAM: LUMBAR SPINE - 2-3 VIEW COMPARISON:  None. FINDINGS: Straightening of normal lordosis. No listhesis. Posterior rod and intrapedicular screw fusion at L4-L5, hardware is intact. Diffuse degenerative disc disease with disc space narrowing and endplate spurring. Facet hypertrophy at L2-L3 and L3-L4. Vertebral body heights are preserved. No acute fracture. Sacroiliac joints are congruent. IMPRESSION: 1. No evidence of acute fracture of the lumbar spine. 2. Posterior fusion at L4-L5. Multilevel degenerative disc disease and facet hypertrophy. Electronically Signed   By: Keith Rake M.D.   On: 05/04/2019 17:34   DG Pelvis 1-2 Views  Result Date: 04/18/2019 CLINICAL DATA:  Post fall with low back and bilateral hip pain. Weakness for several days. Fall last night. Lumbosacral back pain, bilateral hip pain, and knee pain. Bilateral lower extremity swelling. EXAM: PELVIS - 1-2 VIEW COMPARISON:  None. FINDINGS: The cortical margins of the bony pelvis are intact. No fracture. Pubic symphysis and sacroiliac joints are congruent. Both femoral heads are well-seated in the respective acetabula. Age related degenerative change of  both acetabular spurring. IMPRESSION: Negative for pelvic fracture. Electronically Signed   By: Keith Rake M.D.   On: 04/13/2019 17:35   DG Femur Min 2 Views Left  Result Date: 04/28/2019 CLINICAL DATA:  Post fall with low back and bilateral hip pain. Weakness for several days. Fall last night.  Lumbosacral back pain, bilateral hip pain, and knee pain. Bilateral lower extremity swelling. EXAM: LEFT FEMUR 2 VIEWS COMPARISON:  None. FINDINGS: Cortical margins of the left femur are intact. There is no evidence of fracture or other focal bone lesions. Hip and knee alignment are maintained. Curvilinear density adjacent to the distal medial femoral condyle is consistent with remote MCL injury. Diffuse soft tissue edema of the lower extremities nonspecific. There are vascular calcifications. IMPRESSION: 1. No acute fracture of the left femur. 2. Diffuse soft tissue edema, nonspecific. Electronically Signed   By: Keith Rake M.D.   On: 04/08/2019 17:36   DG Femur Min 2 Views Right  Result Date: 04/29/2019 CLINICAL DATA:  Post fall with low back and bilateral hip pain. Weakness for several days. Fall last night. Lumbosacral back pain, bilateral hip pain, and knee pain. Bilateral lower extremity swelling. EXAM: RIGHT FEMUR 2 VIEWS COMPARISON:  None. FINDINGS: Cortical margins of the right femur are intact. There is no evidence of fracture or other focal bone lesions. Hip and knee alignment are maintained. Moderate osteoarthritis of the knee. Generalized soft tissue edema is nonspecific. There are vascular calcifications. IMPRESSION: 1. No fracture of the right femur. 2. Nonspecific generalized soft tissue edema. Electronically Signed   By: Keith Rake M.D.   On: 04/12/2019 17:37    Medications:   . allopurinol  100 mg Oral Daily  . aspirin  81 mg Oral Daily  . atorvastatin  20 mg Oral Daily  . cholecalciferol  2,000 Units Oral Daily  . ferrous sulfate  325 mg Oral BID  . furosemide  40 mg  Intravenous BID  . heparin  5,000 Units Subcutaneous Q8H  . insulin aspart  0-15 Units Subcutaneous TID WC  . patiromer  8.4 g Oral Daily  . sodium chloride flush  3 mL Intravenous Q12H  . vitamin B-12  1,000 mcg Oral Daily   Continuous Infusions: . sodium chloride    . cefTRIAXone (ROCEPHIN)  IV Stopped (04/11/19 0020)     LOS: 1 day   Prudy Candy  [facility]     04/11/2019, 9:09 AM

## 2019-04-11 NOTE — Progress Notes (Signed)
Alerted Dr. Mal Misty that patient's potassium is 5.7. Also alerted him that patient's BP is 123/51 and HR of 50. Md order to hold lasix. No other orders. Will continue to monitor.

## 2019-04-11 NOTE — ED Notes (Signed)
Pt ambulatory to bathroom with assistance at this time.

## 2019-04-12 LAB — GLUCOSE, CAPILLARY
Glucose-Capillary: 118 mg/dL — ABNORMAL HIGH (ref 70–99)
Glucose-Capillary: 118 mg/dL — ABNORMAL HIGH (ref 70–99)
Glucose-Capillary: 100 mg/dL — ABNORMAL HIGH (ref 70–99)
Glucose-Capillary: 133 mg/dL — ABNORMAL HIGH (ref 70–99)

## 2019-04-12 LAB — HEMOGLOBIN AND HEMATOCRIT, BLOOD
HCT: 34.4 % — ABNORMAL LOW (ref 36.0–46.0)
Hemoglobin: 10.8 g/dL — ABNORMAL LOW (ref 12.0–15.0)

## 2019-04-12 LAB — HEPATITIS B SURFACE ANTIBODY,QUALITATIVE: Hep B S Ab: NONREACTIVE

## 2019-04-12 LAB — ECHOCARDIOGRAM COMPLETE
Height: 63 in
Weight: 3776 oz

## 2019-04-12 LAB — BASIC METABOLIC PANEL
Anion gap: 13 (ref 5–15)
BUN: 73 mg/dL — ABNORMAL HIGH (ref 8–23)
CO2: 25 mmol/L (ref 22–32)
Calcium: 9.1 mg/dL (ref 8.9–10.3)
Chloride: 100 mmol/L (ref 98–111)
Creatinine, Ser: 5.05 mg/dL — ABNORMAL HIGH (ref 0.44–1.00)
GFR calc Af Amer: 9 mL/min — ABNORMAL LOW (ref >60)
GFR calc non Af Amer: 8 mL/min — ABNORMAL LOW (ref >60)
Glucose, Bld: 127 mg/dL — ABNORMAL HIGH (ref 70–99)
Potassium: 5.7 mmol/L — ABNORMAL HIGH (ref 3.5–5.1)
Sodium: 138 mmol/L (ref 135–145)

## 2019-04-12 LAB — HEPATITIS B CORE ANTIBODY, IGM: Hep B C IgM: NONREACTIVE

## 2019-04-12 LAB — PHOSPHORUS: Phosphorus: 5.2 mg/dL — ABNORMAL HIGH (ref 2.5–4.6)

## 2019-04-12 LAB — HEPATITIS B SURFACE ANTIGEN: Hepatitis B Surface Ag: NONREACTIVE

## 2019-04-12 LAB — HEPATITIS C ANTIBODY: HCV Ab: NONREACTIVE

## 2019-04-12 MED ORDER — MIDODRINE HCL 5 MG PO TABS
5.0000 mg | ORAL_TABLET | Freq: Three times a day (TID) | ORAL | Status: DC
  Administered 2019-04-12 – 2019-04-20 (×20): 5 mg via ORAL
  Filled 2019-04-12 (×21): qty 1

## 2019-04-12 NOTE — Progress Notes (Signed)
OT Cancellation Note  Patient Details Name: Michele Beck MRN: [id number] DOB: 02-08-1940   Cancelled Treatment:    Reason Eval/Treat Not Completed: Medical issues which prohibited therapy. Chart reviewed. Pt's potassium continues to be elevated at 5.7 since 04/08/2019. Per OT guidelines for elevated potassium, exertional activity currently contra-indicated. Will hold OT at this time and re-attempt OT evaluation at a later date/time as medically appropriate.  Jeni Salles, MPH, MS, OTR/L ascom [phone] 04/12/19, 8:01 AM

## 2019-04-12 NOTE — Progress Notes (Signed)
PT Cancellation Note  Patient Details Name: Michele Beck MRN: [id number] DOB: 1940-12-15   Cancelled Treatment:    Reason Eval/Treat Not Completed: Patient not medically ready.  Chart reviewed.  Pt's potassium noted to still be elevated at 5.7 (since 04/12/2019).  Per PT guidelines for elevated potassium, exertional activity currently contra-indicated.  Will hold PT at this time and re-attempt PT evaluation at a later date/time as medically appropriate.   Leitha Bleak, PT 04/12/19, 8:09 AM

## 2019-04-12 NOTE — Progress Notes (Signed)
Progress Note    Michele Beck  NWG:[id number] DOB: 1940-02-16  DOA: 05/02/2019 PCP: System, Pcp Not In      Brief Narrative:    Medical records reviewed and are as summarized below:  Michele Beck is an 79 y.o. female with past medical history significant for CKD 4, diastolic CHF, pulmonary HTN, DM 2, HTN, gout and obesity, hospitalized and January 2021 for heart failure exacerbation.  Patient has been feeling weak for some time now and she was seen in the emergency room 04/02/2019 with concern for worsening lower extremity edema.  She was referred to the heart failure clinic where she was treated with IV Lasix.  She presented to the emergency room following a fall at home. She was unable to to get up on her own.  She said she was trying to push herself up out of the recliner to stand up but she just felt weak in the knees and slid down on the ground.  She stayed down on the ground until her son came over to her house to help her up.   Urinalysis was abnormal concerning for UTI.  Creatinine was also elevated above her baseline.  She was admitted to the hospital for suspected UTI, acute on chronic diastolic CHF and AKI.    Assessment/Plan:   Principal Problem:   UTI (urinary tract infection) Active Problems:   Type 2 diabetes mellitus with stage 4 chronic kidney disease (HCC)   Essential hypertension   Pulmonary HTN (HCC)   Acute on chronic diastolic heart failure (HCC)   AKI (acute kidney injury) ([place])   Generalized weakness   Fall at home, initial encounter   Obesity, Class III, BMI 40-49.9 (morbid obesity) (HCC)   CKD (chronic kidney disease) stage 4, GFR 15-29 ml/min (HCC)   Acute on chronic diastolic (congestive) heart failure (HCC)   Hyperkalemia   Sinus bradycardia   UTI (urinary tract infection) -Abnormal urinalysis concerning for UTI -IV Rocephin pending culture results    Fluid overload   Acute on chronic diastolic heart failure ([place]), severe pulmonary  hypertension Continue IV Lasix if BP allows.  Midodrine has been started.  Hopefully, this will improve blood pressure and make it easier to administer IV Lasix. Monitor daily weights, BMP and urine output 2D echo showed EF estimated at 55 to 57%, grade 1 diastolic dysfunction and severe tricuspid regurgitation.    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  Continue V[place]. Consult nephrology Avoid nephrotoxins.  No ACE inhibitor/ARB    Sinus bradycardia Asymptomatic.  Hold metoprolol. Monitor on telemetry    Generalized weakness, right knee arthritis   Fall at home, subsequent encounter  Obesity, Class III, BMI 40-49.9 (morbid obesity) ([place]) Morbid obesity a complicating factor PT and OT evaluation    Type 2 diabetes mellitus  Insulin sliding scale as needed  Essential hypertension -Holding amlodipine and hydralazine due to soft blood pressures       Body mass index is 42.05 kg/m.  (Morbid obesity)  Family Communication/Anticipated D/C date and plan/Code Status   DVT prophylaxis: Heparin Code Status: Full code Family Communication: Plan discussed with the patient  Disposition Plan: Plan is from home.  Disposition to be determined pending PT and OT evaluation     Subjective:   She complains of nausea but is better now.  Breathing is better.     Objective:  Vitals:   04/11/19 1907 04/12/19 0444 04/12/19 0756 04/12/19 1143  BP: (!) 113/52 (!) 107/50 (!) 105/41 (!) 102/46  Pulse: (!) 50 (!) 57 (!) 51 (!) 52  Resp:   16 16  Temp:  97.6 F (36.4 C) (!) 97.4 F (36.3 C)   TempSrc:  Oral Oral   SpO2: 100% 92% 95% 92%  Weight:  107.7 kg    Height:        Intake/Output Summary (Last 24 hours) at 04/12/2019 1446 Last data filed at 04/12/2019 1021 Gross per 24 hour  Intake 420 ml  Output 100 ml  Net 320 ml   Filed Weights   05/04/2019 1525 04/11/19 0234  04/12/19 0444  Weight: 108 kg 107 kg 107.7 kg    Exam:  GEN: NAD SKIN: No rash EYES: EOMI ENT: MMM CV: RRR PULM: CTA B ABD: soft, obese, NT, +BS CNS: AAO x 3, non focal EXT: b/l leg edema, no tenderness MSK: Chronic right knee swelling. No tenderness or erythema   Data Reviewed:   I have personally reviewed following labs and imaging studies:  Labs: Labs show the following:   Basic Metabolic Panel: Recent Labs  Lab 04/06/19 1205 04/06/19 1205 04/08/19 1158 04/08/19 1158 04/17/2019 1613 04/28/2019 1613 04/11/19 0508 04/12/19 0643  NA 143  --  142  --  142  --  140 138  K 4.7   < > 5.0   < > 5.7*   < > 5.7* 5.7*  CL 107  --  103  --  102  --  103 100  CO2 25  --  27  --  29  --  25 25  GLUCOSE 118*  --  91  --  120*  --  149* 127*  BUN 53*  --  56*  --  68*  --  72* 73*  CREATININE 3.25*  --  3.86*  --  4.85*  --  5.04* 5.05*  CALCIUM 9.0  --  9.2  --  9.9  --  9.2 9.1  PHOS  --   --   --   --   --   --   --  5.2*   < > = values in this interval not displayed.   GFR Estimated Creatinine Clearance: 10.8 mL/min (A) (by C-G formula based on SCr of 5.05 mg/dL (H)). Liver Function Tests: Recent Labs  Lab 04/28/2019 1613  AST 26  ALT 26  ALKPHOS 110  BILITOT 0.8  PROT 7.3  ALBUMIN 4.3   No results for input(s): LIPASE, AMYLASE in the last 168 hours. No results for input(s): AMMONIA in the last 168 hours. Coagulation profile No results for input(s): INR, PROTIME in the last 168 hours.  CBC: Recent Labs  Lab 05/03/2019 1613 04/12/19 0643  WBC 7.9  --   NEUTROABS 6.3  --   HGB 12.0 10.8*  HCT 36.7 34.4*  MCV 91.3  --   PLT 218  --    Cardiac Enzymes: Recent Labs  Lab 04/20/2019 1613  CKTOTAL 146   BNP (last 3 results) No results for input(s): PROBNP in the last 8760 hours. CBG: Recent Labs  Lab 04/11/19 1206 04/11/19 1649 04/11/19 2110 04/12/19 0759 04/12/19 1144  GLUCAP 148* 130* 98 118* 118*   D-Dimer: No results for input(s): DDIMER in the  last 72 hours. Hgb A1c: Recent Labs    04/24/2019 2128  HGBA1C 6.5*   Lipid Profile: No results for input(s): CHOL, HDL, LDLCALC, TRIG, CHOLHDL,  LDLDIRECT in the last 72 hours. Thyroid function studies: Recent Labs    04/25/2019 1818  TSH 4.256   Anemia work up: No results for input(s): VITAMINB12, FOLATE, FERRITIN, TIBC, IRON, RETICCTPCT in the last 72 hours. Sepsis Labs: Recent Labs  Lab 04/11/2019 1613  WBC 7.9  LATICACIDVEN 1.3    Microbiology Recent Results (from the past 240 hour(s))  SARS CORONAVIRUS 2 (TAT 6-24 HRS) Nasopharyngeal Nasopharyngeal Swab     Status: None   Collection Time: 04/02/19  4:51 PM   Specimen: Nasopharyngeal Swab  Result Value Ref Range Status   SARS Coronavirus 2 NEGATIVE NEGATIVE Final    Comment: (NOTE) SARS-CoV-2 target nucleic acids are NOT DETECTED. The SARS-CoV-2 RNA is generally detectable in upper and lower respiratory specimens during the acute phase of infection. Negative results do not preclude SARS-CoV-2 infection, do not rule out co-infections with other pathogens, and should not be used as the sole basis for treatment or other patient management decisions. Negative results must be combined with clinical observations, patient history, and epidemiological information. The expected result is Negative. Fact Sheet for Patients: SugarRoll.be Fact Sheet for Healthcare Providers: https://www.woods-mathews.com/ This test is not yet approved or cleared by the [place] FDA and  has been authorized for detection and/or diagnosis of SARS-CoV-2 by FDA under an Emergency Use Authorization (EUA). This EUA will remain  in effect (meaning this test can be used) for the duration of the COVID-19 declaration under Section 56 4(b)(1) of the Act, 21 U.S.C. section 360bbb-3(b)(1), unless the authorization is terminated or revoked sooner. Performed at [facility], [place] [address], [place],  [place] [address]   SARS CORONAVIRUS 2 (TAT 6-24 HRS) Nasopharyngeal Nasopharyngeal Swab     Status: None   Collection Time: 04/12/2019  9:28 PM   Specimen: Nasopharyngeal Swab  Result Value Ref Range Status   SARS Coronavirus 2 NEGATIVE NEGATIVE Final    Comment: (NOTE) SARS-CoV-2 target nucleic acids are NOT DETECTED. The SARS-CoV-2 RNA is generally detectable in upper and lower respiratory specimens during the acute phase of infection. Negative results do not preclude SARS-CoV-2 infection, do not rule out co-infections with other pathogens, and should not be used as the sole basis for treatment or other patient management decisions. Negative results must be combined with clinical observations, patient history, and epidemiological information. The expected result is Negative. Fact Sheet for Patients: SugarRoll.be Fact Sheet for Healthcare Providers: https://www.woods-mathews.com/ This test is not yet approved or cleared by the [place] FDA and  has been authorized for detection and/or diagnosis of SARS-CoV-2 by FDA under an Emergency Use Authorization (EUA). This EUA will remain  in effect (meaning this test can be used) for the duration of the COVID-19 declaration under Section 56 4(b)(1) of the Act, 21 U.S.C. section 360bbb-3(b)(1), unless the authorization is terminated or revoked sooner. Performed at [facility], [place] [address], [place], [place] [address]     Procedures and diagnostic studies:  DG Chest 1 View  Result Date: 04/20/2019 CLINICAL DATA:  Low back pain and bilateral hip pain. EXAM: CHEST  1 VIEW COMPARISON:  02/21/2019 FINDINGS: Stable positioning of single lead cardiac pacemaker. The cardiac silhouette is enlarged. Mediastinal contours appear intact. There is no evidence of focal airspace consolidation, pleural effusion or pneumothorax. Increased interstitial markings with central predominance. Osseous structures are  without acute abnormality. Soft tissues are grossly normal. IMPRESSION: 1. Enlarged cardiac silhouette. 2. Increased interstitial markings with central predominance may represent pulmonary vascular congestion. Electronically  Signed   By: Fidela Salisbury M.D.   On: 04/12/2019 17:33   DG Lumbar Spine 2-3 Views  Result Date: 05/05/2019 CLINICAL DATA:  Post fall with low back and bilateral hip pain. Weakness for several days. Fall last night. Lumbosacral back pain, bilateral hip pain, and knee pain. Bilateral lower extremity swelling. EXAM: LUMBAR SPINE - 2-3 VIEW COMPARISON:  None. FINDINGS: Straightening of normal lordosis. No listhesis. Posterior rod and intrapedicular screw fusion at L4-L5, hardware is intact. Diffuse degenerative disc disease with disc space narrowing and endplate spurring. Facet hypertrophy at L2-L3 and L3-L4. Vertebral body heights are preserved. No acute fracture. Sacroiliac joints are congruent. IMPRESSION: 1. No evidence of acute fracture of the lumbar spine. 2. Posterior fusion at L4-L5. Multilevel degenerative disc disease and facet hypertrophy. Electronically Signed   By: Keith Rake M.D.   On: 04/27/2019 17:34   DG Pelvis 1-2 Views  Result Date: 04/07/2019 CLINICAL DATA:  Post fall with low back and bilateral hip pain. Weakness for several days. Fall last night. Lumbosacral back pain, bilateral hip pain, and knee pain. Bilateral lower extremity swelling. EXAM: PELVIS - 1-2 VIEW COMPARISON:  None. FINDINGS: The cortical margins of the bony pelvis are intact. No fracture. Pubic symphysis and sacroiliac joints are congruent. Both femoral heads are well-seated in the respective acetabula. Age related degenerative change of both acetabular spurring. IMPRESSION: Negative for pelvic fracture. Electronically Signed   By: Keith Rake M.D.   On: 04/12/2019 17:35   ECHOCARDIOGRAM COMPLETE  Result Date: 04/12/2019    ECHOCARDIOGRAM REPORT   Patient Name:   CHARMIAN FORBIS Date  of Exam: 04/11/2019 Medical Rec #:  [id number]       Height:       63.0 in Accession #:    [id number]      Weight:       236.0 lb Date of Birth:  13-Jan-1940       BSA:          2.075 m Patient Age:    66 years        BP:           107/58 mmHg Patient Gender: F               HR:           50 bpm. Exam Location:  [facility] Procedure: 2D Echo, Cardiac Doppler and Color Doppler Indications:     CHF-Acute Diastolic 947.09 / G28.36  History:         Patient has prior history of Echocardiogram examinations. CHF;                  Risk Factors:Hypertension.  Sonographer:     Alyse Low Roar Referring Phys:  [id number] Athena Masse Diagnosing Phys: Nelva Bush MD IMPRESSIONS  1. Left ventricular ejection fraction, by estimation, is 55 to 60%. The left ventricle has normal function. Left ventricular endocardial border not optimally defined to evaluate regional wall motion. There is mild left ventricular hypertrophy. Left ventricular diastolic parameters are consistent with Grade I diastolic dysfunction (impaired relaxation). There is the interventricular septum is flattened in systole and diastole, consistent with right ventricular pressure and volume overload.  2. Right ventricular systolic function was not well visualized. The right ventricular size is moderately enlarged. There is moderately elevated pulmonary artery systolic pressure. The estimated right ventricular systolic pressure is 46.5 mmHg.  3. Right atrial size was severely dilated.  4. The mitral valve is normal in structure. Trivial  mitral valve regurgitation. No evidence of mitral stenosis.  5. Tricuspid valve regurgitation is severe.  6. The aortic valve is tricuspid. Aortic valve regurgitation is mild to moderate. Mild aortic valve sclerosis is present, with no evidence of aortic valve stenosis.  7. Mildly dilated pulmonary artery.  8. The inferior vena cava is dilated in size with <50% respiratory variability, suggesting right atrial pressure of 15 mmHg. FINDINGS   Left Ventricle: Left ventricular ejection fraction, by estimation, is 55 to 60%. The left ventricle has normal function. Left ventricular endocardial border not optimally defined to evaluate regional wall motion. The left ventricular internal cavity size was normal in size. There is mild left ventricular hypertrophy. The interventricular septum is flattened in systole and diastole, consistent with right ventricular pressure and volume overload. Left ventricular diastolic parameters are consistent with Grade I diastolic dysfunction (impaired relaxation). Right Ventricle: The right ventricular size is moderately enlarged. No increase in right ventricular wall thickness. Right ventricular systolic function was not well visualized. There is moderately elevated pulmonary artery systolic pressure. The tricuspid regurgitant velocity is 3.22 m/s, and with an assumed right atrial pressure of 15 mmHg, the estimated right ventricular systolic pressure is 09.8 mmHg. Left Atrium: Left atrial size was normal in size. Right Atrium: Right atrial size was severely dilated. Pericardium: Trivial pericardial effusion is present. Mitral Valve: The mitral valve is normal in structure. Trivial mitral valve regurgitation. No evidence of mitral valve stenosis. Tricuspid Valve: The tricuspid valve is normal in structure. Tricuspid valve regurgitation is severe. Aortic Valve: The aortic valve is tricuspid. . There is mild thickening and mild calcification of the aortic valve. Aortic valve regurgitation is mild to moderate. Aortic regurgitation PHT measures 495 msec. Mild aortic valve sclerosis is present, with no evidence of aortic valve stenosis. There is mild thickening of the aortic valve. There is mild calcification of the aortic valve. Aortic valve mean gradient measures 4.0 mmHg. Aortic valve peak gradient measures 8.9 mmHg. Aortic valve area, by VTI measures 1.21 cm. Pulmonic Valve: The pulmonic valve was normal in structure. Pulmonic  valve regurgitation is mild to moderate. No evidence of pulmonic stenosis. Aorta: The aortic root is normal in size and structure. Pulmonary Artery: The pulmonary artery is mildly dilated. Venous: The inferior vena cava is dilated in size with less than 50% respiratory variability, suggesting right atrial pressure of 15 mmHg. IAS/Shunts: There is left bowing of the interatrial septum, suggestive of elevated right atrial pressure. The interatrial septum was not well visualized. Additional Comments: A pacer wire is visualized in the right ventricle.  LEFT VENTRICLE PLAX 2D LVIDd:         4.04 cm  Diastology LVIDs:         2.84 cm  LV e' lateral:   7.40 cm/s LV PW:         1.16 cm  LV E/e' lateral: 5.5 LV IVS:        1.19 cm  LV e' medial:    4.79 cm/s LVOT diam:     1.70 cm  LV E/e' medial:  8.5 LV SV:         42 LV SV Index:   20 LVOT Area:     2.27 cm  RIGHT VENTRICLE RV Mid diam:    4.71 cm RV S prime:     12.50 cm/s TAPSE (M-mode): 2.5 cm LEFT ATRIUM             Index       RIGHT ATRIUM  Index LA diam:        4.10 cm 1.98 cm/m  RA Area:     30.00 cm LA Vol (A2C):   52.7 ml 25.40 ml/m RA Volume:   108.00 ml 52.06 ml/m LA Vol (A4C):   55.1 ml 26.56 ml/m LA Biplane Vol: 56.4 ml 27.19 ml/m  AORTIC VALVE                   PULMONIC VALVE AV Area (Vmax):    1.29 cm    PV Vmax:        1.06 m/s AV Area (Vmean):   1.23 cm    PV Peak grad:   4.5 mmHg AV Area (VTI):     1.21 cm    RVOT Peak grad: 1 mmHg AV Vmax:           149.00 cm/s AV Vmean:          92.700 cm/s AV VTI:            0.348 m AV Peak Grad:      8.9 mmHg AV Mean Grad:      4.0 mmHg LVOT Vmax:         85.00 cm/s LVOT Vmean:        50.300 cm/s LVOT VTI:          0.185 m LVOT/AV VTI ratio: 0.53 AI PHT:            495 msec  AORTA Ao Root diam: 3.00 cm MITRAL VALVE               TRICUSPID VALVE MV Area (PHT): 2.82 cm    TR Peak grad:   41.5 mmHg MV Decel Time: 269 msec    TR Vmax:        322.00 cm/s MV E velocity: 40.70 cm/s MV A velocity: 76.20  cm/s  SHUNTS MV E/A ratio:  0.53        Systemic VTI:  0.18 m                            Systemic Diam: 1.70 cm Nelva Bush MD Electronically signed by Nelva Bush MD Signature Date/Time: 04/12/2019/7:12:53 AM    Final    DG Femur Min 2 Views Left  Result Date: 04/23/2019 CLINICAL DATA:  Post fall with low back and bilateral hip pain. Weakness for several days. Fall last night. Lumbosacral back pain, bilateral hip pain, and knee pain. Bilateral lower extremity swelling. EXAM: LEFT FEMUR 2 VIEWS COMPARISON:  None. FINDINGS: Cortical margins of the left femur are intact. There is no evidence of fracture or other focal bone lesions. Hip and knee alignment are maintained. Curvilinear density adjacent to the distal medial femoral condyle is consistent with remote MCL injury. Diffuse soft tissue edema of the lower extremities nonspecific. There are vascular calcifications. IMPRESSION: 1. No acute fracture of the left femur. 2. Diffuse soft tissue edema, nonspecific. Electronically Signed   By: Keith Rake M.D.   On: 04/13/2019 17:36   DG Femur Min 2 Views Right  Result Date: 05/01/2019 CLINICAL DATA:  Post fall with low back and bilateral hip pain. Weakness for several days. Fall last night. Lumbosacral back pain, bilateral hip pain, and knee pain. Bilateral lower extremity swelling. EXAM: RIGHT FEMUR 2 VIEWS COMPARISON:  None. FINDINGS: Cortical margins of the right femur are intact. There is no evidence of fracture or other focal bone lesions. Hip and knee  alignment are maintained. Moderate osteoarthritis of the knee. Generalized soft tissue edema is nonspecific. There are vascular calcifications. IMPRESSION: 1. No fracture of the right femur. 2. Nonspecific generalized soft tissue edema. Electronically Signed   By: Keith Rake M.D.   On: 04/19/2019 17:37    Medications:   . allopurinol  100 mg Oral Daily  . aspirin  81 mg Oral Daily  . atorvastatin  20 mg Oral Daily  . cholecalciferol   2,000 Units Oral Daily  . ferrous sulfate  325 mg Oral BID  . furosemide  40 mg Intravenous BID  . heparin  5,000 Units Subcutaneous Q8H  . insulin aspart  0-15 Units Subcutaneous TID WC  . midodrine  5 mg Oral TID WC  . patiromer  8.4 g Oral Daily  . sodium chloride flush  3 mL Intravenous Q12H  . vitamin B-12  1,000 mcg Oral Daily   Continuous Infusions: . sodium chloride       LOS: 2 days   Aeric Burnham  [facility]     04/12/2019, 2:46 PM

## 2019-04-12 NOTE — Progress Notes (Signed)
[facility]  ROUNDING NOTE   Subjective:   Patient states she is breathing better and edema has improved.   Furosemide was held due to hypotension.   Objective:  Vital signs in last 24 hours:  Temp:  [97.4 F (36.3 C)-98.2 F (36.8 C)] 97.4 F (36.3 C) (04/06 0756) Pulse Rate:  [50-57] 52 (04/06 1143) Resp:  [16-19] 16 (04/06 1143) BP: (102-113)/(41-58) 102/46 (04/06 1143) SpO2:  [92 %-100 %] 92 % (04/06 1143) Weight:  [107.7 kg] 107.7 kg (04/06 0444)  Weight change: -0.272 kg Filed Weights   04/20/2019 1525 04/11/19 0234 04/12/19 0444  Weight: 108 kg 107 kg 107.7 kg    Intake/Output: I/O last 3 completed shifts: In: 1000 [P.O.:900; IV Piggyback:100] Out: 100 [Urine:100]   Intake/Output this shift:  No intake/output data recorded.  Physical Exam: General: NAD,   Head: Normocephalic, atraumatic. Moist oral mucosal membranes  Eyes: Anicteric, PERRL  Neck: Supple, trachea midline  Lungs:  Bilateral crackles  Heart: Regular rate and rhythm  Abdomen:  Soft, nontender,   Extremities:  + peripheral edema.  Neurologic: Nonfocal, moving all four extremities  Skin: No lesions  Access: none    Basic Metabolic Panel: Recent Labs  Lab 04/06/19 1205 04/06/19 1205 04/08/19 1158 04/08/19 1158 04/22/2019 1613 04/11/19 0508 04/12/19 0643  NA 143  --  142  --  142 140 138  K 4.7  --  5.0  --  5.7* 5.7* 5.7*  CL 107  --  103  --  102 103 100  CO2 25  --  27  --  29 25 25   GLUCOSE 118*  --  91  --  120* 149* 127*  BUN 53*  --  56*  --  68* 72* 73*  CREATININE 3.25*  --  3.86*  --  4.85* 5.04* 5.05*  CALCIUM 9.0   < > 9.2   < > 9.9 9.2 9.1  PHOS  --   --   --   --   --   --  5.2*   < > = values in this interval not displayed.    Liver Function Tests: Recent Labs  Lab 04/08/2019 1613  AST 26  ALT 26  ALKPHOS 110  BILITOT 0.8  PROT 7.3  ALBUMIN 4.3   No results for input(s): LIPASE, AMYLASE in the last 168 hours. No results for input(s): AMMONIA in  the last 168 hours.  CBC: Recent Labs  Lab 04/30/2019 1613 04/12/19 0643  WBC 7.9  --   NEUTROABS 6.3  --   HGB 12.0 10.8*  HCT 36.7 34.4*  MCV 91.3  --   PLT 218  --     Cardiac Enzymes: Recent Labs  Lab 04/27/2019 1613  CKTOTAL 146    BNP: Invalid input(s): POCBNP  CBG: Recent Labs  Lab 04/11/19 1206 04/11/19 1649 04/11/19 2110 04/12/19 0759 04/12/19 1144  GLUCAP 148* 130* 98 118* 118*    Microbiology: Results for orders placed or performed during the hospital encounter of 05/03/2019  SARS CORONAVIRUS 2 (TAT 6-24 HRS) Nasopharyngeal Nasopharyngeal Swab     Status: None   Collection Time: 04/18/2019  9:28 PM   Specimen: Nasopharyngeal Swab  Result Value Ref Range Status   SARS Coronavirus 2 NEGATIVE NEGATIVE Final    Comment: (NOTE) SARS-CoV-2 target nucleic acids are NOT DETECTED. The SARS-CoV-2 RNA is generally detectable in upper and lower respiratory specimens during the acute phase of infection. Negative results do not preclude SARS-CoV-2 infection, do not  rule out co-infections with other pathogens, and should not be used as the sole basis for treatment or other patient management decisions. Negative results must be combined with clinical observations, patient history, and epidemiological information. The expected result is Negative. Fact Sheet for Patients: SugarRoll.be Fact Sheet for Healthcare Providers: https://www.woods-mathews.com/ This test is not yet approved or cleared by the [place] FDA and  has been authorized for detection and/or diagnosis of SARS-CoV-2 by FDA under an Emergency Use Authorization (EUA). This EUA will remain  in effect (meaning this test can be used) for the duration of the COVID-19 declaration under Section 56 4(b)(1) of the Act, 21 U.S.C. section 360bbb-3(b)(1), unless the authorization is terminated or revoked sooner. Performed at [facility], [place] [address],  [place], [place] [address]     Coagulation Studies: No results for input(s): LABPROT, INR in the last 72 hours.  Urinalysis: Recent Labs    04/07/2019 2008  COLORURINE YELLOW*  LABSPEC 1.012  PHURINE 5.0  GLUCOSEU NEGATIVE  HGBUR SMALL*  BILIRUBINUR NEGATIVE  KETONESUR NEGATIVE  PROTEINUR 100*  NITRITE NEGATIVE  LEUKOCYTESUR MODERATE*      Imaging: DG Chest 1 View  Result Date: 04/18/2019 CLINICAL DATA:  Low back pain and bilateral hip pain. EXAM: CHEST  1 VIEW COMPARISON:  02/21/2019 FINDINGS: Stable positioning of single lead cardiac pacemaker. The cardiac silhouette is enlarged. Mediastinal contours appear intact. There is no evidence of focal airspace consolidation, pleural effusion or pneumothorax. Increased interstitial markings with central predominance. Osseous structures are without acute abnormality. Soft tissues are grossly normal. IMPRESSION: 1. Enlarged cardiac silhouette. 2. Increased interstitial markings with central predominance may represent pulmonary vascular congestion. Electronically Signed   By: Fidela Salisbury M.D.   On: 04/09/2019 17:33   DG Lumbar Spine 2-3 Views  Result Date: 05/01/2019 CLINICAL DATA:  Post fall with low back and bilateral hip pain. Weakness for several days. Fall last night. Lumbosacral back pain, bilateral hip pain, and knee pain. Bilateral lower extremity swelling. EXAM: LUMBAR SPINE - 2-3 VIEW COMPARISON:  None. FINDINGS: Straightening of normal lordosis. No listhesis. Posterior rod and intrapedicular screw fusion at L4-L5, hardware is intact. Diffuse degenerative disc disease with disc space narrowing and endplate spurring. Facet hypertrophy at L2-L3 and L3-L4. Vertebral body heights are preserved. No acute fracture. Sacroiliac joints are congruent. IMPRESSION: 1. No evidence of acute fracture of the lumbar spine. 2. Posterior fusion at L4-L5. Multilevel degenerative disc disease and facet hypertrophy. Electronically Signed   By: Keith Rake M.D.   On: 04/12/2019 17:34   DG Pelvis 1-2 Views  Result Date: 05/02/2019 CLINICAL DATA:  Post fall with low back and bilateral hip pain. Weakness for several days. Fall last night. Lumbosacral back pain, bilateral hip pain, and knee pain. Bilateral lower extremity swelling. EXAM: PELVIS - 1-2 VIEW COMPARISON:  None. FINDINGS: The cortical margins of the bony pelvis are intact. No fracture. Pubic symphysis and sacroiliac joints are congruent. Both femoral heads are well-seated in the respective acetabula. Age related degenerative change of both acetabular spurring. IMPRESSION: Negative for pelvic fracture. Electronically Signed   By: Keith Rake M.D.   On: 04/11/2019 17:35   ECHOCARDIOGRAM COMPLETE  Result Date: 04/12/2019    ECHOCARDIOGRAM REPORT   Patient Name:   MERSADIES PETREE Date of Exam: 04/11/2019 Medical Rec #:  [id number]       Height:       63.0 in Accession #:    [id number]      Weight:  236.0 lb Date of Birth:  [birth year]       BSA:          2.075 m Patient Age:    79 years        BP:           107/58 mmHg Patient Gender: F               HR:           50 bpm. Exam Location:  [facility] Procedure: 2D Echo, Cardiac Doppler and Color Doppler Indications:     CHF-Acute Diastolic 741.28 / N86.76  History:         Patient has prior history of Echocardiogram examinations. CHF;                  Risk Factors:Hypertension.  Sonographer:     Alyse Low Roar Referring Phys:  [id number] Athena Masse Diagnosing Phys: Nelva Bush MD IMPRESSIONS  1. Left ventricular ejection fraction, by estimation, is 55 to 60%. The left ventricle has normal function. Left ventricular endocardial border not optimally defined to evaluate regional wall motion. There is mild left ventricular hypertrophy. Left ventricular diastolic parameters are consistent with Grade I diastolic dysfunction (impaired relaxation). There is the interventricular septum is flattened in systole and diastole, consistent with right ventricular  pressure and volume overload.  2. Right ventricular systolic function was not well visualized. The right ventricular size is moderately enlarged. There is moderately elevated pulmonary artery systolic pressure. The estimated right ventricular systolic pressure is 96.2 mmHg.  3. Right atrial size was severely dilated.  4. The mitral valve is normal in structure. Trivial mitral valve regurgitation. No evidence of mitral stenosis.  5. Tricuspid valve regurgitation is severe.  6. The aortic valve is tricuspid. Aortic valve regurgitation is mild to moderate. Mild aortic valve sclerosis is present, with no evidence of aortic valve stenosis.  7. Mildly dilated pulmonary artery.  8. The inferior vena cava is dilated in size with <50% respiratory variability, suggesting right atrial pressure of 15 mmHg. FINDINGS  Left Ventricle: Left ventricular ejection fraction, by estimation, is 55 to 60%. The left ventricle has normal function. Left ventricular endocardial border not optimally defined to evaluate regional wall motion. The left ventricular internal cavity size was normal in size. There is mild left ventricular hypertrophy. The interventricular septum is flattened in systole and diastole, consistent with right ventricular pressure and volume overload. Left ventricular diastolic parameters are consistent with Grade I diastolic dysfunction (impaired relaxation). Right Ventricle: The right ventricular size is moderately enlarged. No increase in right ventricular wall thickness. Right ventricular systolic function was not well visualized. There is moderately elevated pulmonary artery systolic pressure. The tricuspid regurgitant velocity is 3.22 m/s, and with an assumed right atrial pressure of 15 mmHg, the estimated right ventricular systolic pressure is 83.6 mmHg. Left Atrium: Left atrial size was normal in size. Right Atrium: Right atrial size was severely dilated. Pericardium: Trivial pericardial effusion is present. Mitral  Valve: The mitral valve is normal in structure. Trivial mitral valve regurgitation. No evidence of mitral valve stenosis. Tricuspid Valve: The tricuspid valve is normal in structure. Tricuspid valve regurgitation is severe. Aortic Valve: The aortic valve is tricuspid. . There is mild thickening and mild calcification of the aortic valve. Aortic valve regurgitation is mild to moderate. Aortic regurgitation PHT measures 495 msec. Mild aortic valve sclerosis is present, with no evidence of aortic valve stenosis. There is mild thickening of the aortic valve. There is  mild calcification of the aortic valve. Aortic valve mean gradient measures 4.0 mmHg. Aortic valve peak gradient measures 8.9 mmHg. Aortic valve area, by VTI measures 1.21 cm. Pulmonic Valve: The pulmonic valve was normal in structure. Pulmonic valve regurgitation is mild to moderate. No evidence of pulmonic stenosis. Aorta: The aortic root is normal in size and structure. Pulmonary Artery: The pulmonary artery is mildly dilated. Venous: The inferior vena cava is dilated in size with less than 50% respiratory variability, suggesting right atrial pressure of 15 mmHg. IAS/Shunts: There is left bowing of the interatrial septum, suggestive of elevated right atrial pressure. The interatrial septum was not well visualized. Additional Comments: A pacer wire is visualized in the right ventricle.  LEFT VENTRICLE PLAX 2D LVIDd:         4.04 cm  Diastology LVIDs:         2.84 cm  LV e' lateral:   7.40 cm/s LV PW:         1.16 cm  LV E/e' lateral: 5.5 LV IVS:        1.19 cm  LV e' medial:    4.79 cm/s LVOT diam:     1.70 cm  LV E/e' medial:  8.5 LV SV:         42 LV SV Index:   20 LVOT Area:     2.27 cm  RIGHT VENTRICLE RV Mid diam:    4.71 cm RV S prime:     12.50 cm/s TAPSE (M-mode): 2.5 cm LEFT ATRIUM             Index       RIGHT ATRIUM           Index LA diam:        4.10 cm 1.98 cm/m  RA Area:     30.00 cm LA Vol (A2C):   52.7 ml 25.40 ml/m RA Volume:    108.00 ml 52.06 ml/m LA Vol (A4C):   55.1 ml 26.56 ml/m LA Biplane Vol: 56.4 ml 27.19 ml/m  AORTIC VALVE                   PULMONIC VALVE AV Area (Vmax):    1.29 cm    PV Vmax:        1.06 m/s AV Area (Vmean):   1.23 cm    PV Peak grad:   4.5 mmHg AV Area (VTI):     1.21 cm    RVOT Peak grad: 1 mmHg AV Vmax:           149.00 cm/s AV Vmean:          92.700 cm/s AV VTI:            0.348 m AV Peak Grad:      8.9 mmHg AV Mean Grad:      4.0 mmHg LVOT Vmax:         85.00 cm/s LVOT Vmean:        50.300 cm/s LVOT VTI:          0.185 m LVOT/AV VTI ratio: 0.53 AI PHT:            495 msec  AORTA Ao Root diam: 3.00 cm MITRAL VALVE               TRICUSPID VALVE MV Area (PHT): 2.82 cm    TR Peak grad:   41.5 mmHg MV Decel Time: 269 msec    TR Vmax:  322.00 cm/s MV E velocity: 40.70 cm/s MV A velocity: 76.20 cm/s  SHUNTS MV E/A ratio:  0.53        Systemic VTI:  0.18 m                            Systemic Diam: 1.70 cm Nelva Bush MD Electronically signed by Nelva Bush MD Signature Date/Time: 04/12/2019/7:12:53 AM    Final    DG Femur Min 2 Views Left  Result Date: 05/02/2019 CLINICAL DATA:  Post fall with low back and bilateral hip pain. Weakness for several days. Fall last night. Lumbosacral back pain, bilateral hip pain, and knee pain. Bilateral lower extremity swelling. EXAM: LEFT FEMUR 2 VIEWS COMPARISON:  None. FINDINGS: Cortical margins of the left femur are intact. There is no evidence of fracture or other focal bone lesions. Hip and knee alignment are maintained. Curvilinear density adjacent to the distal medial femoral condyle is consistent with remote MCL injury. Diffuse soft tissue edema of the lower extremities nonspecific. There are vascular calcifications. IMPRESSION: 1. No acute fracture of the left femur. 2. Diffuse soft tissue edema, nonspecific. Electronically Signed   By: Keith Rake M.D.   On: 04/12/2019 17:36   DG Femur Min 2 Views Right  Result Date: 04/20/2019 CLINICAL DATA:   Post fall with low back and bilateral hip pain. Weakness for several days. Fall last night. Lumbosacral back pain, bilateral hip pain, and knee pain. Bilateral lower extremity swelling. EXAM: RIGHT FEMUR 2 VIEWS COMPARISON:  None. FINDINGS: Cortical margins of the right femur are intact. There is no evidence of fracture or other focal bone lesions. Hip and knee alignment are maintained. Moderate osteoarthritis of the knee. Generalized soft tissue edema is nonspecific. There are vascular calcifications. IMPRESSION: 1. No fracture of the right femur. 2. Nonspecific generalized soft tissue edema. Electronically Signed   By: Keith Rake M.D.   On: 04/23/2019 17:37     Medications:   . sodium chloride     . allopurinol  100 mg Oral Daily  . aspirin  81 mg Oral Daily  . atorvastatin  20 mg Oral Daily  . cholecalciferol  2,000 Units Oral Daily  . ferrous sulfate  325 mg Oral BID  . furosemide  40 mg Intravenous BID  . heparin  5,000 Units Subcutaneous Q8H  . insulin aspart  0-15 Units Subcutaneous TID WC  . midodrine  5 mg Oral TID WC  . patiromer  8.4 g Oral Daily  . sodium chloride flush  3 mL Intravenous Q12H  . vitamin B-12  1,000 mcg Oral Daily   sodium chloride, acetaminophen, ALPRAZolam, HYDROcodone-acetaminophen, ondansetron (ZOFRAN) IV, ondansetron **OR** [DISCONTINUED] ondansetron (ZOFRAN) IV, senna-docusate, sodium chloride flush  Assessment/ Plan:  Ms. Stefanee Mckell is a 79 y.o. black female with hypertension, diabetes mellitus type II, congestive heart failure, gout, hyperlipidemia, pacemaker, back surgery who is admitted to [facility] on 04/16/2019 for Hyperkalemia [E87.5] Weakness [S17.7] Chronic diastolic congestive heart failure ([place]) [I50.32] AKI (acute kidney injury) ([place]) [N17.9] Fall, initial encounter [W19.XXXA] Acute on chronic diastolic (congestive) heart failure ([place]) [I50.33]  1. Acute kidney injury with hyperkalemia on chronic kidney disease stage LT:[id number] by Dr.  Jodell [facility]).  Baseline creatinine of 2.56, GFR of 17 on 01/23/2019.  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  Now with uremic symptoms. Patient may need dialysis soon.  - patiromer  2. Hypotension: with  acute exacerbation of diastolic congestive heart failure. Home regimen of amlodipine, metoprolol, torsemide and hydralazine.  Blood pressure is soft at 107/58. Home regimen held.  - IV furosemide - Start midodrine.   3. Diabetes mellitus type II with chronic kidney disease: hemoglobin A1c of 6.5%. Suspect this is low due to renal insufficiency.    LOS: 2 Michele Beck 4/6/20211:58 PM

## 2019-04-12 NOTE — Progress Notes (Signed)
BP 102/46 HR still in 50s. Asymptomatic. Dr. Mal Misty order to hold evening dose of lasix.

## 2019-04-12 NOTE — Progress Notes (Signed)
Patient had an episode of  what appeared to be moderate amount of coffee ground emesis. Patient denies abdominal pain or nausea at this time. Patient voices feeling "better"  after episode. No other issues noted at this time. Will continue to monitor.

## 2019-04-12 NOTE — Progress Notes (Signed)
Alerted Dr. Mal Misty that patient's BP is still soft at 105/41 with HR of 51. MD order to hold lasix this AM. Also alerted MD that patient had some coffee-ground emesis last night per night shift. No nausea, just a small episode. No s/s of bleeding, but suggested MD check H/H. MD placed order for H/H. Will continue to monitor.

## 2019-04-13 ENCOUNTER — Other Ambulatory Visit (INDEPENDENT_AMBULATORY_CARE_PROVIDER_SITE_OTHER): Payer: Self-pay | Admitting: Vascular Surgery

## 2019-04-13 ENCOUNTER — Ambulatory Visit: Payer: Medicare Other | Admitting: Family

## 2019-04-13 DIAGNOSIS — N184 Chronic kidney disease, stage 4 (severe): Secondary | ICD-10-CM

## 2019-04-13 LAB — HEPATITIS B CORE ANTIBODY, IGM: Hep B C IgM: NONREACTIVE

## 2019-04-13 LAB — CBC WITH DIFFERENTIAL/PLATELET
Abs Immature Granulocytes: 0.1 10*3/uL — ABNORMAL HIGH (ref 0.00–0.07)
Basophils Absolute: 0 10*3/uL (ref 0.0–0.1)
Basophils Relative: 1 %
Eosinophils Absolute: 0 10*3/uL (ref 0.0–0.5)
Eosinophils Relative: 0 %
HCT: 36 % (ref 36.0–46.0)
Hemoglobin: 11.7 g/dL — ABNORMAL LOW (ref 12.0–15.0)
Immature Granulocytes: 2 %
Lymphocytes Relative: 11 %
Lymphs Abs: 0.7 10*3/uL (ref 0.7–4.0)
MCH: 29.5 pg (ref 26.0–34.0)
MCHC: 32.5 g/dL (ref 30.0–36.0)
MCV: 90.7 fL (ref 80.0–100.0)
Monocytes Absolute: 0.5 10*3/uL (ref 0.1–1.0)
Monocytes Relative: 7 %
Neutro Abs: 5.3 10*3/uL (ref 1.7–7.7)
Neutrophils Relative %: 79 %
Platelets: 180 10*3/uL (ref 150–400)
RBC: 3.97 MIL/uL (ref 3.87–5.11)
RDW: 17.4 % — ABNORMAL HIGH (ref 11.5–15.5)
Smear Review: NORMAL
WBC: 6.6 10*3/uL (ref 4.0–10.5)
nRBC: 3.2 % — ABNORMAL HIGH (ref 0.0–0.2)

## 2019-04-13 LAB — PROTEIN ELECTROPHORESIS, SERUM
A/G Ratio: 1.3 (ref 0.7–1.7)
Albumin ELP: 3 g/dL (ref 2.9–4.4)
Alpha-1-Globulin: 0.3 g/dL (ref 0.0–0.4)
Alpha-2-Globulin: 0.6 g/dL (ref 0.4–1.0)
Beta Globulin: 0.7 g/dL (ref 0.7–1.3)
Gamma Globulin: 0.7 g/dL (ref 0.4–1.8)
Globulin, Total: 2.3 g/dL (ref 2.2–3.9)
Total Protein ELP: 5.3 g/dL — ABNORMAL LOW (ref 6.0–8.5)

## 2019-04-13 LAB — HEPATITIS B SURFACE ANTIBODY,QUALITATIVE: Hep B S Ab: NONREACTIVE

## 2019-04-13 LAB — BASIC METABOLIC PANEL
Anion gap: 11 (ref 5–15)
BUN: 78 mg/dL — ABNORMAL HIGH (ref 8–23)
CO2: 27 mmol/L (ref 22–32)
Calcium: 9.3 mg/dL (ref 8.9–10.3)
Chloride: 99 mmol/L (ref 98–111)
Creatinine, Ser: 5.28 mg/dL — ABNORMAL HIGH (ref 0.44–1.00)
GFR calc Af Amer: 8 mL/min — ABNORMAL LOW (ref >60)
GFR calc non Af Amer: 7 mL/min — ABNORMAL LOW (ref >60)
Glucose, Bld: 124 mg/dL — ABNORMAL HIGH (ref 70–99)
Potassium: 5.6 mmol/L — ABNORMAL HIGH (ref 3.5–5.1)
Sodium: 137 mmol/L (ref 135–145)

## 2019-04-13 LAB — PROTEIN ELECTRO, RANDOM URINE
Albumin ELP, Urine: 67.3 %
Alpha-1-Globulin, U: 1.5 %
Alpha-2-Globulin, U: 9.3 %
Beta Globulin, U: 7.4 %
Gamma Globulin, U: 14.4 %
Total Protein, Urine: 97.3 mg/dL

## 2019-04-13 LAB — KAPPA/LAMBDA LIGHT CHAINS
Kappa free light chain: 57.9 mg/L — ABNORMAL HIGH (ref 3.3–19.4)
Kappa, lambda light chain ratio: 1.97 — ABNORMAL HIGH (ref 0.26–1.65)
Lambda free light chains: 29.4 mg/L — ABNORMAL HIGH (ref 5.7–26.3)

## 2019-04-13 LAB — GLUCOSE, CAPILLARY
Glucose-Capillary: 128 mg/dL — ABNORMAL HIGH (ref 70–99)
Glucose-Capillary: 98 mg/dL (ref 70–99)
Glucose-Capillary: 93 mg/dL (ref 70–99)
Glucose-Capillary: 98 mg/dL (ref 70–99)

## 2019-04-13 LAB — HEPATITIS B SURFACE ANTIGEN: Hepatitis B Surface Ag: NONREACTIVE

## 2019-04-13 LAB — PARATHYROID HORMONE, INTACT (NO CA): PTH: 107 pg/mL — ABNORMAL HIGH (ref 15–65)

## 2019-04-13 MED ORDER — PATIROMER SORBITEX CALCIUM 8.4 G PO PACK
16.8000 g | PACK | Freq: Every day | ORAL | Status: DC
  Administered 2019-04-15 – 2019-04-18 (×3): 16.8 g via ORAL
  Filled 2019-04-13 (×8): qty 2

## 2019-04-13 NOTE — Progress Notes (Signed)
PROGRESS NOTE    Michele Beck   MOQ:[id number]  DOB: 06/15/1940  PCP: System, Pcp Not In    DOA: 04/17/2019 LOS: 3   Brief Narrative   Michele Beck is an 79 y.o. femalewith past medical history significant forCKD 4, diastolic CHF (admitted in Jan 2021 for decompensation), pulmonary HTN, DM 2, HTN, gout and obesity. Patient has been feeling weak for some time, seen in the emergency room 04/02/2019 with concern for worsening lower extremity edema, referred to the heart failure clinic where she was treated with IV Lasix.  Now presented to the ED on 4/4 following a fall at home while trying to get up from her recliner.  In the ED, UA concerning for UTI, creatinine above baseline CKD.  Admitted to hospitalist service for further evaluation and management.   Assessment & Plan   Principal Problem:   UTI (urinary tract infection) Active Problems:   Type 2 diabetes mellitus with stage 4 chronic kidney disease (HCC)   Essential hypertension   Pulmonary HTN (HCC)   Acute on chronic diastolic heart failure (HCC)   AKI (acute kidney injury) ([place])   Generalized weakness   Fall at home, initial encounter   Obesity, Class III, BMI 40-49.9 (morbid obesity) (HCC)   CKD (chronic kidney disease) stage 4, GFR 15-29 ml/min (HCC)   Acute on chronic diastolic (congestive) heart failure (HCC)   Hyperkalemia   Sinus bradycardia   UTI (urinary tract infection) -presented with generalized weakness status post fall.  Urinalysis concerning for UTI with moderate leukocytes, many bacteria and greater than 50 WBCs. -Continue Rocephin  --Follow-up urine culture, appears was never collected or sent, reordered  Acute on chronic diastolic heart failure (HCC) Severe pulmonary hypertension Echocardiogram showed EF 55 to 35%, grade 1 diastolic dysfunction, severe tricuspid regurgitation --Continue IV Lasix if BP allows --Continue Midodrine  --daily weights, strict I/O's  AKI (acute kidney injury) vs  progression of CKD to stage V Hyperkalemia --Continue IV Lasix as BP allows.   --Continue Veltassa. --Consult nephrology --Avoid nephrotoxins and hypotension. No ACE inhibitor/ARB.  Sinus bradycardia - asymptomatic.   --Hold metoprolol. --Telemetry monitoring  Generalized weakness, right knee arthritis Fall at home, subsequent encounter besity, Class III, BMI 40-49.9 (morbid obesity)  Morbid obesity a complicating factor PT and OT evaluations are pending due to hyperkalemia and renal failure  Type 2 diabetes mellitus  Insulin sliding scale as needed  Essential hypertension -Holding amlodipine and hydralazine due to soft blood pressures  Patient BMI: Body mass index is 42.12 kg/m.   DVT prophylaxis: Heparin Diet:  Diet Orders (From admission, onward)    Start     Ordered   04/12/19 1014  Diet renal with fluid restriction Fluid restriction: 1200 mL Fluid; Room service appropriate? Yes; Fluid consistency: Thin  Diet effective now    Question Answer Comment  Fluid restriction: 1200 mL Fluid   Room service appropriate? Yes   Fluid consistency: Thin      04/12/19 1013            Code Status: Full Code    Subjective 04/13/19    Patient seen examined at bedside this morning.  No acute events reported overnight.  She was sleeping but awoke to voice.  She denied acute complaints including fevers or chills, nausea or vomiting or diarrhea, chest pain or shortness of breath.  Does report poor appetite and on taste however.  She does state that she would be agreeable to dialysis if it is recommended.  Disposition Plan & Communication   Dispo & Barriers: Pending clinical improvement and clearance by nephrology.  Patient may need to be started on dialysis. Coming from: Home Exp d/c date: TBD Medically stable for d/c?  No  Family Communication: None at bedside during encounter, will attempt to call   Consults, Procedures, Significant Events   Consultants:    Nephrology  Procedures:   None  Antimicrobials:   None   Objective   Vitals:   04/12/19 1946 04/13/19 0323 04/13/19 0339 04/13/19 0753  BP: (!) 105/48 (!) 116/53  113/70  Pulse: (!) 50 (!) 57  61  Resp: 20 20  17   Temp: (!) 97.4 F (36.3 C) (!) 97.5 F (36.4 C)  97.6 F (36.4 C)  TempSrc: Oral Oral  Oral  SpO2: 90% 94%  93%  Weight:   107.9 kg   Height:        Intake/Output Summary (Last 24 hours) at 04/13/2019 0808 Last data filed at 04/13/2019 0753 Gross per 24 hour  Intake 480 ml  Output 200 ml  Net 280 ml   Filed Weights   04/11/19 0234 04/12/19 0444 04/13/19 0339  Weight: 107 kg 107.7 kg 107.9 kg    Physical Exam:  General exam: awake, alert, no acute distress, obese HEENT: moist mucus membranes, hearing grossly normal  Respiratory system: Diminished at the bases, bilateral crackles, no wheezes or rhonchi, normal respiratory effort. Cardiovascular system: normal S1/S2, RRR, bilateral lower extremity pitting edema.   Gastrointestinal system: soft, NT, ND, no HSM felt, +bowel sounds. Central nervous system: A&O x4. no gross focal neurologic deficits, normal speech Extremities: moves all, no cyanosis, normal tone Psychiatry: normal mood, congruent affect, judgement and insight appear normal  Labs   Data Reviewed: I have personally reviewed following labs and imaging studies  CBC: Recent Labs  Lab 04/20/2019 1613 04/12/19 0643 04/13/19 0612  WBC 7.9  --  6.6  NEUTROABS 6.3  --  5.3  HGB 12.0 10.8* 11.7*  HCT 36.7 34.4* 36.0  MCV 91.3  --  90.7  PLT 218  --  329   Basic Metabolic Panel: Recent Labs  Lab 04/08/19 1158 04/26/2019 1613 04/11/19 0508 04/12/19 0643 04/13/19 0612  NA 142 142 140 138 137  K 5.0 5.7* 5.7* 5.7* 5.6*  CL 103 102 103 100 99  CO2 27 29 25 25 27   GLUCOSE 91 120* 149* 127* 124*  BUN 56* 68* 72* 73* 78*  CREATININE 3.86* 4.85* 5.04* 5.05* 5.28*  CALCIUM 9.2 9.9 9.2 9.1 9.3  PHOS  --   --   --  5.2*  --     GFR: Estimated Creatinine Clearance: 10.3 mL/min (A) (by C-G formula based on SCr of 5.28 mg/dL (H)). Liver Function Tests: Recent Labs  Lab 04/13/2019 1613  AST 26  ALT 26  ALKPHOS 110  BILITOT 0.8  PROT 7.3  ALBUMIN 4.3   No results for input(s): LIPASE, AMYLASE in the last 168 hours. No results for input(s): AMMONIA in the last 168 hours. Coagulation Profile: No results for input(s): INR, PROTIME in the last 168 hours. Cardiac Enzymes: Recent Labs  Lab 04/09/2019 1613  CKTOTAL 146   BNP (last 3 results) No results for input(s): PROBNP in the last 8760 hours. HbA1C: Recent Labs    04/25/2019 2128  HGBA1C 6.5*   CBG: Recent Labs  Lab 04/12/19 0759 04/12/19 1144 04/12/19 1707 04/12/19 2136 04/13/19 0748  GLUCAP 118* 118* 100* 133* 128*   Lipid Profile: No results  for input(s): CHOL, HDL, LDLCALC, TRIG, CHOLHDL, LDLDIRECT in the last 72 hours. Thyroid Function Tests: Recent Labs    04/16/2019 1818  TSH 4.256   Anemia Panel: No results for input(s): VITAMINB12, FOLATE, FERRITIN, TIBC, IRON, RETICCTPCT in the last 72 hours. Sepsis Labs: Recent Labs  Lab 04/11/2019 1613  LATICACIDVEN 1.3    Recent Results (from the past 240 hour(s))  SARS CORONAVIRUS 2 (TAT 6-24 HRS) Nasopharyngeal Nasopharyngeal Swab     Status: None   Collection Time: 04/09/2019  9:28 PM   Specimen: Nasopharyngeal Swab  Result Value Ref Range Status   SARS Coronavirus 2 NEGATIVE NEGATIVE Final    Comment: (NOTE) SARS-CoV-2 target nucleic acids are NOT DETECTED. The SARS-CoV-2 RNA is generally detectable in upper and lower respiratory specimens during the acute phase of infection. Negative results do not preclude SARS-CoV-2 infection, do not rule out co-infections with other pathogens, and should not be used as the sole basis for treatment or other patient management decisions. Negative results must be combined with clinical observations, patient history, and epidemiological information.  The expected result is Negative. Fact Sheet for Patients: SugarRoll.be Fact Sheet for Healthcare Providers: https://www.woods-mathews.com/ This test is not yet approved or cleared by the [place] FDA and  has been authorized for detection and/or diagnosis of SARS-CoV-2 by FDA under an Emergency Use Authorization (EUA). This EUA will remain  in effect (meaning this test can be used) for the duration of the COVID-19 declaration under Section 56 4(b)(1) of the Act, 21 U.S.C. section 360bbb-3(b)(1), unless the authorization is terminated or revoked sooner. Performed at [facility], [place] [address] [place],  [address]       Imaging Studies   ECHOCARDIOGRAM COMPLETE  Result Date: 04/12/2019    ECHOCARDIOGRAM REPORT   Patient Name:   KIMALA HORNE Date of Exam: 04/11/2019 Medical Rec #:  [id number]       Height:       63.0 in Accession #:    [id number]      Weight:       236.0 lb Date of Birth:  Jun 03, 1940       BSA:          2.075 m Patient Age:    19 years        BP:           107/58 mmHg Patient Gender: F               HR:           50 bpm. Exam Location:  [facility] Procedure: 2D Echo, Cardiac Doppler and Color Doppler Indications:     CHF-Acute Diastolic 765.46 / T03.54  History:         Patient has prior history of Echocardiogram examinations. CHF;                  Risk Factors:Hypertension.  Sonographer:     Alyse Low Roar Referring Phys:  [id number] Athena Masse Diagnosing Phys: Nelva Bush MD IMPRESSIONS  1. Left ventricular ejection fraction, by estimation, is 55 to 60%. The left ventricle has normal function. Left ventricular endocardial border not optimally defined to evaluate regional wall motion. There is mild left ventricular hypertrophy. Left ventricular diastolic parameters are consistent with Grade I diastolic dysfunction (impaired relaxation). There is the interventricular septum is flattened in systole and diastole, consistent  with right ventricular pressure and volume overload.  2. Right ventricular systolic function was not well visualized. The right ventricular size  is moderately enlarged. There is moderately elevated pulmonary artery systolic pressure. The estimated right ventricular systolic pressure is 00.3 mmHg.  3. Right atrial size was severely dilated.  4. The mitral valve is normal in structure. Trivial mitral valve regurgitation. No evidence of mitral stenosis.  5. Tricuspid valve regurgitation is severe.  6. The aortic valve is tricuspid. Aortic valve regurgitation is mild to moderate. Mild aortic valve sclerosis is present, with no evidence of aortic valve stenosis.  7. Mildly dilated pulmonary artery.  8. The inferior vena cava is dilated in size with <50% respiratory variability, suggesting right atrial pressure of 15 mmHg. FINDINGS  Left Ventricle: Left ventricular ejection fraction, by estimation, is 55 to 60%. The left ventricle has normal function. Left ventricular endocardial border not optimally defined to evaluate regional wall motion. The left ventricular internal cavity size was normal in size. There is mild left ventricular hypertrophy. The interventricular septum is flattened in systole and diastole, consistent with right ventricular pressure and volume overload. Left ventricular diastolic parameters are consistent with Grade I diastolic dysfunction (impaired relaxation). Right Ventricle: The right ventricular size is moderately enlarged. No increase in right ventricular wall thickness. Right ventricular systolic function was not well visualized. There is moderately elevated pulmonary artery systolic pressure. The tricuspid regurgitant velocity is 3.22 m/s, and with an assumed right atrial pressure of 15 mmHg, the estimated right ventricular systolic pressure is 70.4 mmHg. Left Atrium: Left atrial size was normal in size. Right Atrium: Right atrial size was severely dilated. Pericardium: Trivial pericardial  effusion is present. Mitral Valve: The mitral valve is normal in structure. Trivial mitral valve regurgitation. No evidence of mitral valve stenosis. Tricuspid Valve: The tricuspid valve is normal in structure. Tricuspid valve regurgitation is severe. Aortic Valve: The aortic valve is tricuspid. . There is mild thickening and mild calcification of the aortic valve. Aortic valve regurgitation is mild to moderate. Aortic regurgitation PHT measures 495 msec. Mild aortic valve sclerosis is present, with no evidence of aortic valve stenosis. There is mild thickening of the aortic valve. There is mild calcification of the aortic valve. Aortic valve mean gradient measures 4.0 mmHg. Aortic valve peak gradient measures 8.9 mmHg. Aortic valve area, by VTI measures 1.21 cm. Pulmonic Valve: The pulmonic valve was normal in structure. Pulmonic valve regurgitation is mild to moderate. No evidence of pulmonic stenosis. Aorta: The aortic root is normal in size and structure. Pulmonary Artery: The pulmonary artery is mildly dilated. Venous: The inferior vena cava is dilated in size with less than 50% respiratory variability, suggesting right atrial pressure of 15 mmHg. IAS/Shunts: There is left bowing of the interatrial septum, suggestive of elevated right atrial pressure. The interatrial septum was not well visualized. Additional Comments: A pacer wire is visualized in the right ventricle.  LEFT VENTRICLE PLAX 2D LVIDd:         4.04 cm  Diastology LVIDs:         2.84 cm  LV e' lateral:   7.40 cm/s LV PW:         1.16 cm  LV E/e' lateral: 5.5 LV IVS:        1.19 cm  LV e' medial:    4.79 cm/s LVOT diam:     1.70 cm  LV E/e' medial:  8.5 LV SV:         42 LV SV Index:   20 LVOT Area:     2.27 cm  RIGHT VENTRICLE RV Mid diam:    4.71  cm RV S prime:     12.50 cm/s TAPSE (M-mode): 2.5 cm LEFT ATRIUM             Index       RIGHT ATRIUM           Index LA diam:        4.10 cm 1.98 cm/m  RA Area:     30.00 cm LA Vol (A2C):   52.7 ml  25.40 ml/m RA Volume:   108.00 ml 52.06 ml/m LA Vol (A4C):   55.1 ml 26.56 ml/m LA Biplane Vol: 56.4 ml 27.19 ml/m  AORTIC VALVE                   PULMONIC VALVE AV Area (Vmax):    1.29 cm    PV Vmax:        1.06 m/s AV Area (Vmean):   1.23 cm    PV Peak grad:   4.5 mmHg AV Area (VTI):     1.21 cm    RVOT Peak grad: 1 mmHg AV Vmax:           149.00 cm/s AV Vmean:          92.700 cm/s AV VTI:            0.348 m AV Peak Grad:      8.9 mmHg AV Mean Grad:      4.0 mmHg LVOT Vmax:         85.00 cm/s LVOT Vmean:        50.300 cm/s LVOT VTI:          0.185 m LVOT/AV VTI ratio: 0.53 AI PHT:            495 msec  AORTA Ao Root diam: 3.00 cm MITRAL VALVE               TRICUSPID VALVE MV Area (PHT): 2.82 cm    TR Peak grad:   41.5 mmHg MV Decel Time: 269 msec    TR Vmax:        322.00 cm/s MV E velocity: 40.70 cm/s MV A velocity: 76.20 cm/s  SHUNTS MV E/A ratio:  0.53        Systemic VTI:  0.18 m                            Systemic Diam: 1.70 cm Nelva Bush MD Electronically signed by Nelva Bush MD Signature Date/Time: 04/12/2019/7:12:53 AM    Final      Medications   Scheduled Meds: . allopurinol  100 mg Oral Daily  . aspirin  81 mg Oral Daily  . atorvastatin  20 mg Oral Daily  . cholecalciferol  2,000 Units Oral Daily  . ferrous sulfate  325 mg Oral BID  . furosemide  40 mg Intravenous BID  . heparin  5,000 Units Subcutaneous Q8H  . insulin aspart  0-15 Units Subcutaneous TID WC  . midodrine  5 mg Oral TID WC  . patiromer  8.4 g Oral Daily  . sodium chloride flush  3 mL Intravenous Q12H  . vitamin B-12  1,000 mcg Oral Daily   Continuous Infusions: . sodium chloride         LOS: 3 days    Time spent: 40 minutes    Ezekiel Slocumb, DO [facility]   If 7PM-7AM, please contact night-coverage [email or web address] 04/13/2019, 8:08 AM

## 2019-04-13 NOTE — Progress Notes (Signed)
PT Cancellation Note  Patient Details Name: Michele Beck MRN: [id number] DOB: 1940/02/13   Cancelled Treatment:    Reason Eval/Treat Not Completed: Patient not medically ready.  Chart reviewed.  Pt's potassium continues to be elevated (5.6 this morning; was at 5.7).  Per PT guidelines for elevated potassium, exertional activity currently contraindicated.  D/t therapy unable to work with pt for the past 3 days d/t elevated potassium, will discontinue current PT order per therapy guidelines.  Please re-consult PT when pt is medically appropriate for participation in physical therapy.  Leitha Bleak, PT 04/13/19, 1:16 PM

## 2019-04-13 NOTE — Progress Notes (Signed)
[facility]  ROUNDING NOTE   Subjective:   UOP 200   Poor appetite and bad taste in mouth  Creatinine 5.28 (5.05) K 5.6 (5.7)  Midodrine and furosemide IV were given this morning.   Objective:  Vital signs in last 24 hours:  Temp:  [97.4 F (36.3 C)-97.6 F (36.4 C)] 97.6 F (36.4 C) (04/07 0753) Pulse Rate:  [50-61] 61 (04/07 0753) Resp:  [16-20] 17 (04/07 0753) BP: (102-116)/(46-70) 113/70 (04/07 0753) SpO2:  [90 %-94 %] 93 % (04/07 0753) Weight:  [107.9 kg] 107.9 kg (04/07 0339)  Weight change: 0.181 kg Filed Weights   04/11/19 0234 04/12/19 0444 04/13/19 0339  Weight: 107 kg 107.7 kg 107.9 kg    Intake/Output: I/O last 3 completed shifts: In: 720 [P.O.:720] Out: 300 [Urine:300]   Intake/Output this shift:  No intake/output data recorded.  Physical Exam: General: NAD,   Head: Normocephalic, atraumatic. Moist oral mucosal membranes  Eyes: Anicteric, PERRL  Neck: Supple, trachea midline  Lungs:  Bilateral crackles  Heart: Regular rate and rhythm  Abdomen:  Soft, nontender,   Extremities:  + peripheral edema.  Neurologic: Nonfocal, moving all four extremities  Skin: No lesions  Access: none    Basic Metabolic Panel: Recent Labs  Lab 04/08/19 1158 04/08/19 1158 04/17/2019 1613 04/22/2019 1613 04/11/19 0508 04/12/19 0643 04/13/19 0612  NA 142  --  142  --  140 138 137  K 5.0  --  5.7*  --  5.7* 5.7* 5.6*  CL 103  --  102  --  103 100 99  CO2 27  --  29  --  25 25 27   GLUCOSE 91  --  120*  --  149* 127* 124*  BUN 56*  --  68*  --  72* 73* 78*  CREATININE 3.86*  --  4.85*  --  5.04* 5.05* 5.28*  CALCIUM 9.2   < > 9.9   < > 9.2 9.1 9.3  PHOS  --   --   --   --   --  5.2*  --    < > = values in this interval not displayed.    Liver Function Tests: Recent Labs  Lab 04/23/2019 1613  AST 26  ALT 26  ALKPHOS 110  BILITOT 0.8  PROT 7.3  ALBUMIN 4.3   No results for input(s): LIPASE, AMYLASE in the last 168 hours. No results for  input(s): AMMONIA in the last 168 hours.  CBC: Recent Labs  Lab 04/09/2019 1613 04/12/19 0643 04/13/19 0612  WBC 7.9  --  6.6  NEUTROABS 6.3  --  5.3  HGB 12.0 10.8* 11.7*  HCT 36.7 34.4* 36.0  MCV 91.3  --  90.7  PLT 218  --  180    Cardiac Enzymes: Recent Labs  Lab 04/11/2019 1613  CKTOTAL 146    BNP: Invalid input(s): POCBNP  CBG: Recent Labs  Lab 04/12/19 0759 04/12/19 1144 04/12/19 1707 04/12/19 2136 04/13/19 0748  GLUCAP 118* 118* 100* 133* 128*    Microbiology: Results for orders placed or performed during the hospital encounter of 04/11/2019  SARS CORONAVIRUS 2 (TAT 6-24 HRS) Nasopharyngeal Nasopharyngeal Swab     Status: None   Collection Time: 05/02/2019  9:28 PM   Specimen: Nasopharyngeal Swab  Result Value Ref Range Status   SARS Coronavirus 2 NEGATIVE NEGATIVE Final    Comment: (NOTE) SARS-CoV-2 target nucleic acids are NOT DETECTED. The SARS-CoV-2 RNA is generally detectable in upper and lower respiratory specimens  during the acute phase of infection. Negative results do not preclude SARS-CoV-2 infection, do not rule out co-infections with other pathogens, and should not be used as the sole basis for treatment or other patient management decisions. Negative results must be combined with clinical observations, patient history, and epidemiological information. The expected result is Negative. Fact Sheet for Patients: SugarRoll.be Fact Sheet for Healthcare Providers: https://www.woods-mathews.com/ This test is not yet approved or cleared by the [place] FDA and  has been authorized for detection and/or diagnosis of SARS-CoV-2 by FDA under an Emergency Use Authorization (EUA). This EUA will remain  in effect (meaning this test can be used) for the duration of the COVID-19 declaration under Section 56 4(b)(1) of the Act, 21 U.S.C. section 360bbb-3(b)(1), unless the authorization is terminated or revoked  sooner. Performed at [facility], [place] [address], [place], [place] [address]     Coagulation Studies: No results for input(s): LABPROT, INR in the last 72 hours.  Urinalysis: Recent Labs    04/09/2019 2008  COLORURINE YELLOW*  LABSPEC 1.012  PHURINE 5.0  GLUCOSEU NEGATIVE  HGBUR SMALL*  BILIRUBINUR NEGATIVE  KETONESUR NEGATIVE  PROTEINUR 100*  NITRITE NEGATIVE  LEUKOCYTESUR MODERATE*      Imaging: ECHOCARDIOGRAM COMPLETE  Result Date: 04/12/2019    ECHOCARDIOGRAM REPORT   Patient Name:   GIOVANNINA MUN Date of Exam: 04/11/2019 Medical Rec #:  [id number]       Height:       63.0 in Accession #:    [id number]      Weight:       236.0 lb Date of Birth:  04/20/1940       BSA:          2.075 m Patient Age:    72 years        BP:           107/58 mmHg Patient Gender: F               HR:           50 bpm. Exam Location:  [facility] Procedure: 2D Echo, Cardiac Doppler and Color Doppler Indications:     CHF-Acute Diastolic 601.09 / N23.55  History:         Patient has prior history of Echocardiogram examinations. CHF;                  Risk Factors:Hypertension.  Sonographer:     Alyse Low Roar Referring Phys:  [id number] Athena Masse Diagnosing Phys: Nelva Bush MD IMPRESSIONS  1. Left ventricular ejection fraction, by estimation, is 55 to 60%. The left ventricle has normal function. Left ventricular endocardial border not optimally defined to evaluate regional wall motion. There is mild left ventricular hypertrophy. Left ventricular diastolic parameters are consistent with Grade I diastolic dysfunction (impaired relaxation). There is the interventricular septum is flattened in systole and diastole, consistent with right ventricular pressure and volume overload.  2. Right ventricular systolic function was not well visualized. The right ventricular size is moderately enlarged. There is moderately elevated pulmonary artery systolic pressure. The estimated right ventricular systolic pressure is 42.7  mmHg.  3. Right atrial size was severely dilated.  4. The mitral valve is normal in structure. Trivial mitral valve regurgitation. No evidence of mitral stenosis.  5. Tricuspid valve regurgitation is severe.  6. The aortic valve is tricuspid. Aortic valve regurgitation is mild to moderate. Mild aortic valve sclerosis is present, with no evidence of aortic valve stenosis.  7. Mildly dilated pulmonary artery.  8. The inferior vena cava is dilated in size with <50% respiratory variability, suggesting right atrial pressure of 15 mmHg. FINDINGS  Left Ventricle: Left ventricular ejection fraction, by estimation, is 55 to 60%. The left ventricle has normal function. Left ventricular endocardial border not optimally defined to evaluate regional wall motion. The left ventricular internal cavity size was normal in size. There is mild left ventricular hypertrophy. The interventricular septum is flattened in systole and diastole, consistent with right ventricular pressure and volume overload. Left ventricular diastolic parameters are consistent with Grade I diastolic dysfunction (impaired relaxation). Right Ventricle: The right ventricular size is moderately enlarged. No increase in right ventricular wall thickness. Right ventricular systolic function was not well visualized. There is moderately elevated pulmonary artery systolic pressure. The tricuspid regurgitant velocity is 3.22 m/s, and with an assumed right atrial pressure of 15 mmHg, the estimated right ventricular systolic pressure is 99.3 mmHg. Left Atrium: Left atrial size was normal in size. Right Atrium: Right atrial size was severely dilated. Pericardium: Trivial pericardial effusion is present. Mitral Valve: The mitral valve is normal in structure. Trivial mitral valve regurgitation. No evidence of mitral valve stenosis. Tricuspid Valve: The tricuspid valve is normal in structure. Tricuspid valve regurgitation is severe. Aortic Valve: The aortic valve is tricuspid.  . There is mild thickening and mild calcification of the aortic valve. Aortic valve regurgitation is mild to moderate. Aortic regurgitation PHT measures 495 msec. Mild aortic valve sclerosis is present, with no evidence of aortic valve stenosis. There is mild thickening of the aortic valve. There is mild calcification of the aortic valve. Aortic valve mean gradient measures 4.0 mmHg. Aortic valve peak gradient measures 8.9 mmHg. Aortic valve area, by VTI measures 1.21 cm. Pulmonic Valve: The pulmonic valve was normal in structure. Pulmonic valve regurgitation is mild to moderate. No evidence of pulmonic stenosis. Aorta: The aortic root is normal in size and structure. Pulmonary Artery: The pulmonary artery is mildly dilated. Venous: The inferior vena cava is dilated in size with less than 50% respiratory variability, suggesting right atrial pressure of 15 mmHg. IAS/Shunts: There is left bowing of the interatrial septum, suggestive of elevated right atrial pressure. The interatrial septum was not well visualized. Additional Comments: A pacer wire is visualized in the right ventricle.  LEFT VENTRICLE PLAX 2D LVIDd:         4.04 cm  Diastology LVIDs:         2.84 cm  LV e' lateral:   7.40 cm/s LV PW:         1.16 cm  LV E/e' lateral: 5.5 LV IVS:        1.19 cm  LV e' medial:    4.79 cm/s LVOT diam:     1.70 cm  LV E/e' medial:  8.5 LV SV:         42 LV SV Index:   20 LVOT Area:     2.27 cm  RIGHT VENTRICLE RV Mid diam:    4.71 cm RV S prime:     12.50 cm/s TAPSE (M-mode): 2.5 cm LEFT ATRIUM             Index       RIGHT ATRIUM           Index LA diam:        4.10 cm 1.98 cm/m  RA Area:     30.00 cm LA Vol (A2C):   52.7 ml 25.40 ml/m RA Volume:  108.00 ml 52.06 ml/m LA Vol (A4C):   55.1 ml 26.56 ml/m LA Biplane Vol: 56.4 ml 27.19 ml/m  AORTIC VALVE                   PULMONIC VALVE AV Area (Vmax):    1.29 cm    PV Vmax:        1.06 m/s AV Area (Vmean):   1.23 cm    PV Peak grad:   4.5 mmHg AV Area (VTI):      1.21 cm    RVOT Peak grad: 1 mmHg AV Vmax:           149.00 cm/s AV Vmean:          92.700 cm/s AV VTI:            0.348 m AV Peak Grad:      8.9 mmHg AV Mean Grad:      4.0 mmHg LVOT Vmax:         85.00 cm/s LVOT Vmean:        50.300 cm/s LVOT VTI:          0.185 m LVOT/AV VTI ratio: 0.53 AI PHT:            495 msec  AORTA Ao Root diam: 3.00 cm MITRAL VALVE               TRICUSPID VALVE MV Area (PHT): 2.82 cm    TR Peak grad:   41.5 mmHg MV Decel Time: 269 msec    TR Vmax:        322.00 cm/s MV E velocity: 40.70 cm/s MV A velocity: 76.20 cm/s  SHUNTS MV E/A ratio:  0.53        Systemic VTI:  0.18 m                            Systemic Diam: 1.70 cm Nelva Bush MD Electronically signed by Nelva Bush MD Signature Date/Time: 04/12/2019/7:12:53 AM    Final      Medications:   . sodium chloride     . allopurinol  100 mg Oral Daily  . aspirin  81 mg Oral Daily  . atorvastatin  20 mg Oral Daily  . cholecalciferol  2,000 Units Oral Daily  . ferrous sulfate  325 mg Oral BID  . furosemide  40 mg Intravenous BID  . heparin  5,000 Units Subcutaneous Q8H  . insulin aspart  0-15 Units Subcutaneous TID WC  . midodrine  5 mg Oral TID WC  . [START ON 04/15/2019] patiromer  16.8 g Oral Daily  . sodium chloride flush  3 mL Intravenous Q12H  . vitamin B-12  1,000 mcg Oral Daily   sodium chloride, acetaminophen, ALPRAZolam, HYDROcodone-acetaminophen, ondansetron (ZOFRAN) IV, ondansetron **OR** [DISCONTINUED] ondansetron (ZOFRAN) IV, senna-docusate, sodium chloride flush  Assessment/ Plan:  [name] is a 79 y.o. black female with hypertension, diabetes mellitus type II, congestive heart failure, gout, hyperlipidemia, pacemaker, back surgery who is admitted to [facility] on 04/23/2019 for Hyperkalemia [E87.5] Weakness [S06.3] Chronic diastolic congestive heart failure ([place]) [I50.32] AKI (acute kidney injury) ([place]) [N17.9] Fall, initial encounter [W19.XXXA] Acute on chronic diastolic (congestive)  heart failure ([place]) [I50.33]  1. Acute kidney injury with hyperkalemia on chronic kidney disease stage KZ:[id number] by Dr. Jodell Cipro [facility]).  Baseline creatinine of 2.56, GFR of 17 on 01/23/2019.  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  Now with uremic symptoms. Patient may need dialysis.  -Increase patiromer dose - Discussed dialysis with patient. She is agreeable to start if furosemide is ineffective.  I have discussed case with patient's primary Nephrologist, Dr. Lindalou Hose with [facility] [place], [place])  2. Hypotension: with acute exacerbation of diastolic congestive heart failure. Home regimen of amlodipine, metoprolol, torsemide and hydralazine.  Home regimen held.  - IV furosemide - midodrine.   3. Diabetes mellitus type II with chronic kidney disease: hemoglobin A1c of 6.5%. Suspect this is low due to renal insufficiency.   4. Secondary Hyperparathyroidism: PTH 107. Low for level of renal dysfunction. Phosphorus and calcium at goal.   5. Anemia with chronic kidney disease: hemoglobin stable. 11.7. Normocytic. No indication for EPO.    LOS: 3 Zaydin Billey 4/7/202111:46 AM

## 2019-04-13 NOTE — Progress Notes (Signed)
Spoke with dr. Juleen China regarding patient bun 78 creat 5.28 and scheduled lasix. Per md he is okay with patient taking scheduled lasix as long as her pressure have improved.

## 2019-04-13 NOTE — Progress Notes (Addendum)
OT Cancellation Note  Patient Details Name: Michele Beck MRN: [id number] DOB: 1940-03-11   Cancelled Treatment:    Reason Eval/Treat Not Completed: Medical issues which prohibited therapy. Chart reviewed. Pt's potassium continues to be elevated since 04/24/2019 (was 5.7, now 5.6). PerOT guidelines for elevated potassium, exertional activity currently contra-indicated. Will holdOT at this time and re-attemptOT evaluation at a later date/time as medically appropriate.  Addendum, 3:33pm: D/t therapy unable to work with pt for the past 3 days d/t elevated potassium, will discontinue current OT order per therapy guidelines.  Please re-consult OT when pt is medically appropriate for participation in occupational therapy.  Jeni Salles, MPH, MS, OTR/L ascom [phone] 04/13/19, 8:04 AM

## 2019-04-13 NOTE — Care Management Important Message (Signed)
Important Message  Patient Details  Name: Michele Beck MRN: [id number] Date of Birth: Jul 06, 1940   Medicare Important Message Given:  Yes     Dannette Barbara 04/13/2019, 11:08 AM

## 2019-04-14 ENCOUNTER — Encounter: Admission: EM | Disposition: E | Payer: Self-pay | Source: Home / Self Care | Attending: Internal Medicine

## 2019-04-14 ENCOUNTER — Inpatient Hospital Stay: Payer: Medicare Other

## 2019-04-14 ENCOUNTER — Encounter: Payer: Self-pay | Admitting: Internal Medicine

## 2019-04-14 DIAGNOSIS — E875 Hyperkalemia: Secondary | ICD-10-CM

## 2019-04-14 DIAGNOSIS — N186 End stage renal disease: Secondary | ICD-10-CM

## 2019-04-14 DIAGNOSIS — Z992 Dependence on renal dialysis: Secondary | ICD-10-CM

## 2019-04-14 DIAGNOSIS — W19XXXA Unspecified fall, initial encounter: Secondary | ICD-10-CM

## 2019-04-14 DIAGNOSIS — N179 Acute kidney failure, unspecified: Principal | ICD-10-CM

## 2019-04-14 DIAGNOSIS — I5032 Chronic diastolic (congestive) heart failure: Secondary | ICD-10-CM

## 2019-04-14 HISTORY — PX: DIALYSIS/PERMA CATHETER INSERTION: CATH118288

## 2019-04-14 LAB — BASIC METABOLIC PANEL
Anion gap: 14 (ref 5–15)
Anion gap: 11 (ref 5–15)
BUN: 80 mg/dL — ABNORMAL HIGH (ref 8–23)
BUN: 60 mg/dL — ABNORMAL HIGH (ref 8–23)
CO2: 26 mmol/L (ref 22–32)
CO2: 30 mmol/L (ref 22–32)
Calcium: 9.5 mg/dL (ref 8.9–10.3)
Calcium: 8.8 mg/dL — ABNORMAL LOW (ref 8.9–10.3)
Chloride: 99 mmol/L (ref 98–111)
Chloride: 98 mmol/L (ref 98–111)
Creatinine, Ser: 5.32 mg/dL — ABNORMAL HIGH (ref 0.44–1.00)
Creatinine, Ser: 4.08 mg/dL — ABNORMAL HIGH (ref 0.44–1.00)
GFR calc Af Amer: 8 mL/min — ABNORMAL LOW (ref >60)
GFR calc Af Amer: 11 mL/min — ABNORMAL LOW (ref >60)
GFR calc non Af Amer: 7 mL/min — ABNORMAL LOW (ref >60)
GFR calc non Af Amer: 10 mL/min — ABNORMAL LOW (ref >60)
Glucose, Bld: 94 mg/dL (ref 70–99)
Glucose, Bld: 114 mg/dL — ABNORMAL HIGH (ref 70–99)
Potassium: 5.4 mmol/L — ABNORMAL HIGH (ref 3.5–5.1)
Potassium: 4.5 mmol/L (ref 3.5–5.1)
Sodium: 139 mmol/L (ref 135–145)
Sodium: 139 mmol/L (ref 135–145)

## 2019-04-14 LAB — GLUCOSE, CAPILLARY
Glucose-Capillary: 115 mg/dL — ABNORMAL HIGH (ref 70–99)
Glucose-Capillary: 119 mg/dL — ABNORMAL HIGH (ref 70–99)
Glucose-Capillary: 81 mg/dL (ref 70–99)
Glucose-Capillary: 84 mg/dL (ref 70–99)
Glucose-Capillary: 88 mg/dL (ref 70–99)
Glucose-Capillary: 90 mg/dL (ref 70–99)
Glucose-Capillary: 99 mg/dL (ref 70–99)

## 2019-04-14 LAB — QUANTIFERON-TB GOLD PLUS (RQFGPL)
QuantiFERON Mitogen Value: 2.47 IU/mL
QuantiFERON Nil Value: 0.02 IU/mL
QuantiFERON TB1 Ag Value: 0.01 IU/mL
QuantiFERON TB2 Ag Value: 0.02 IU/mL

## 2019-04-14 LAB — QUANTIFERON-TB GOLD PLUS: QuantiFERON-TB Gold Plus: NEGATIVE

## 2019-04-14 SURGERY — DIALYSIS/PERMA CATHETER INSERTION
Anesthesia: Choice

## 2019-04-14 MED ORDER — SODIUM CHLORIDE 0.9 % IV SOLN
1.0000 g | INTRAVENOUS | Status: DC
  Administered 2019-04-14 – 2019-04-15 (×2): 1 g via INTRAVENOUS
  Filled 2019-04-14 (×2): qty 1

## 2019-04-14 MED ORDER — FENTANYL CITRATE (PF) 100 MCG/2ML IJ SOLN
INTRAMUSCULAR | Status: DC | PRN
  Administered 2019-04-14: 50 ug via INTRAVENOUS

## 2019-04-14 MED ORDER — MIDAZOLAM HCL 2 MG/2ML IJ SOLN
INTRAMUSCULAR | Status: DC | PRN
  Administered 2019-04-14: 2 mg via INTRAVENOUS

## 2019-04-14 MED ORDER — FENTANYL CITRATE (PF) 100 MCG/2ML IJ SOLN
INTRAMUSCULAR | Status: AC
  Filled 2019-04-14: qty 2

## 2019-04-14 MED ORDER — ONDANSETRON HCL 4 MG/2ML IJ SOLN
4.0000 mg | Freq: Four times a day (QID) | INTRAMUSCULAR | Status: DC | PRN
  Administered 2019-04-18: 4 mg via INTRAVENOUS
  Filled 2019-04-14: qty 2

## 2019-04-14 MED ORDER — MIDAZOLAM HCL 5 MG/5ML IJ SOLN
INTRAMUSCULAR | Status: AC
  Filled 2019-04-14: qty 5

## 2019-04-14 MED ORDER — CHLORHEXIDINE GLUCONATE CLOTH 2 % EX PADS
6.0000 | MEDICATED_PAD | Freq: Every day | CUTANEOUS | Status: DC
  Administered 2019-04-15 – 2019-04-19 (×5): 6 via TOPICAL

## 2019-04-14 MED ORDER — MIDAZOLAM HCL 2 MG/ML PO SYRP
8.0000 mg | ORAL_SOLUTION | Freq: Once | ORAL | Status: DC | PRN
  Filled 2019-04-14: qty 4

## 2019-04-14 MED ORDER — CEFAZOLIN SODIUM-DEXTROSE 1-4 GM/50ML-% IV SOLN
1.0000 g | Freq: Once | INTRAVENOUS | Status: DC
  Filled 2019-04-14: qty 50

## 2019-04-14 MED ORDER — SODIUM CHLORIDE 0.9 % IV SOLN: INTRAVENOUS | Status: DC

## 2019-04-14 MED ORDER — METHYLPREDNISOLONE SODIUM SUCC 125 MG IJ SOLR: 125.0000 mg | Freq: Once | INTRAMUSCULAR | Status: DC | PRN

## 2019-04-14 MED ORDER — HEPARIN SODIUM (PORCINE) 10000 UNIT/ML IJ SOLN
INTRAMUSCULAR | Status: AC
  Filled 2019-04-14: qty 1

## 2019-04-14 MED ORDER — DIPHENHYDRAMINE HCL 50 MG/ML IJ SOLN: 50.0000 mg | Freq: Once | INTRAMUSCULAR | Status: DC | PRN

## 2019-04-14 MED ORDER — FAMOTIDINE 20 MG PO TABS: 40.0000 mg | ORAL_TABLET | Freq: Once | ORAL | Status: DC | PRN

## 2019-04-14 MED ORDER — HYDROMORPHONE HCL 1 MG/ML IJ SOLN
1.0000 mg | Freq: Once | INTRAMUSCULAR | Status: DC | PRN
  Filled 2019-04-14: qty 1

## 2019-04-14 SURGICAL SUPPLY — 8 items
BIOPATCH RED 1 DISK 7.0 (GAUZE/BANDAGES/DRESSINGS) ×1 IMPLANT
BIOPATCH RED 1IN DISK 7.0MM (GAUZE/BANDAGES/DRESSINGS) ×1
CATH PALINDROME RT-P 15FX19CM (CATHETERS) ×2 IMPLANT
DERMABOND ADVANCED (GAUZE/BANDAGES/DRESSINGS) ×2
DERMABOND ADVANCED .7 DNX12 (GAUZE/BANDAGES/DRESSINGS) IMPLANT
PACK ANGIOGRAPHY (CUSTOM PROCEDURE TRAY) ×2 IMPLANT
SUT MNCRL AB 4-0 PS2 18 (SUTURE) ×2 IMPLANT
SUT PROLENE 0 CT 1 30 (SUTURE) ×2 IMPLANT

## 2019-04-14 NOTE — H&P (Signed)
[facility] History & Physical Update  The patient was interviewed and re-examined.  The patient's previous History and Physical has been reviewed and is unchanged.  There is no change in the plan of care. We plan to proceed with the scheduled procedure.  Leotis Pain, MD  04/26/2019, 9:21 AM

## 2019-04-14 NOTE — Progress Notes (Signed)
CBG checked at 1351 by Sherron Monday, RN: 88 mg/dL

## 2019-04-14 NOTE — Progress Notes (Signed)
[facility]  ROUNDING NOTE   Subjective:   Seen and examined on first hemodialysis treatment. Tolerating treatment well.     HEMODIALYSIS FLOWSHEET:  Blood Flow Rate (mL/min): 200 mL/min Arterial Pressure (mmHg): -70 mmHg Venous Pressure (mmHg): 50 mmHg Transmembrane Pressure (mmHg): 30 mmHg Ultrafiltration Rate (mL/min): 250 mL/min Dialysate Flow Rate (mL/min): 300 ml/min Conductivity: Machine : 15.3 Conductivity: Machine : 15.3 Dialysis Fluid Bolus: Normal Saline Bolus Amount (mL): 250 mL    Objective:  Vital signs in last 24 hours:  Temp:  [97.4 F (36.3 C)-97.9 F (36.6 C)] 97.5 F (36.4 C) (04/08 1537) Pulse Rate:  [56-80] 68 (04/08 1620) Resp:  [9-21] 18 (04/08 1620) BP: (96-134)/(42-97) 100/51 (04/08 1620) SpO2:  [90 %-97 %] 97 % (04/08 1620) Weight:  [107.7 kg] 107.7 kg (04/08 0857)  Weight change: -0.136 kg Filed Weights   04/13/19 0339 04/22/2019 0500 04/25/2019 0857  Weight: 107.9 kg 107.7 kg 107.7 kg    Intake/Output: I/O last 3 completed shifts: In: -  Out: 750 [Urine:750]   Intake/Output this shift:  No intake/output data recorded.  Physical Exam: General: NAD,   Head: Normocephalic, atraumatic. Moist oral mucosal membranes  Eyes: Anicteric, PERRL  Neck: Supple, trachea midline  Lungs:  Bilateral crackles  Heart: Regular rate and rhythm  Abdomen:  Soft, nontender,   Extremities:  + peripheral edema.  Neurologic: Nonfocal, moving all four extremities  Skin: No lesions  Access: RIJ permcath 4/8 Dr. Lucky Cowboy    Basic Metabolic Panel: Recent Labs  Lab 04/25/2019 1613 05/01/2019 1613 04/11/19 5916 04/11/19 3846 04/12/19 6599 04/13/19 0612 05/01/2019 0604  NA 142  --  140  --  138 137 139  K 5.7*  --  5.7*  --  5.7* 5.6* 5.4*  CL 102  --  103  --  100 99 99  CO2 29  --  25  --  25 27 26   GLUCOSE 120*  --  149*  --  127* 124* 94  BUN 68*  --  72*  --  73* 78* 80*  CREATININE 4.85*  --  5.04*  --  5.05* 5.28* 5.32*  CALCIUM 9.9   < >  9.2   < > 9.1 9.3 9.5  PHOS  --   --   --   --  5.2*  --   --    < > = values in this interval not displayed.    Liver Function Tests: Recent Labs  Lab 05/02/2019 1613  AST 26  ALT 26  ALKPHOS 110  BILITOT 0.8  PROT 7.3  ALBUMIN 4.3   No results for input(s): LIPASE, AMYLASE in the last 168 hours. No results for input(s): AMMONIA in the last 168 hours.  CBC: Recent Labs  Lab 05/05/2019 1613 04/12/19 0643 04/13/19 0612  WBC 7.9  --  6.6  NEUTROABS 6.3  --  5.3  HGB 12.0 10.8* 11.7*  HCT 36.7 34.4* 36.0  MCV 91.3  --  90.7  PLT 218  --  180    Cardiac Enzymes: Recent Labs  Lab 04/11/2019 1613  CKTOTAL 146    BNP: Invalid input(s): POCBNP  CBG: Recent Labs  Lab 05/03/2019 0717 04/17/2019 0859 05/06/2019 1057 04/13/2019 1141 05/03/2019 1622  GLUCAP 90 81 99 88 27    Microbiology: Results for orders placed or performed during the hospital encounter of 04/09/2019  SARS CORONAVIRUS 2 (TAT 6-24 HRS) Nasopharyngeal Nasopharyngeal Swab     Status: None   Collection Time: 05/05/2019  9:28 PM   Specimen: Nasopharyngeal Swab  Result Value Ref Range Status   SARS Coronavirus 2 NEGATIVE NEGATIVE Final    Comment: (NOTE) SARS-CoV-2 target nucleic acids are NOT DETECTED. The SARS-CoV-2 RNA is generally detectable in upper and lower respiratory specimens during the acute phase of infection. Negative results do not preclude SARS-CoV-2 infection, do not rule out co-infections with other pathogens, and should not be used as the sole basis for treatment or other patient management decisions. Negative results must be combined with clinical observations, patient history, and epidemiological information. The expected result is Negative. Fact Sheet for Patients: SugarRoll.be Fact Sheet for Healthcare Providers: https://www.woods-mathews.com/ This test is not yet approved or cleared by the [place] FDA and  has been authorized for detection  and/or diagnosis of SARS-CoV-2 by FDA under an Emergency Use Authorization (EUA). This EUA will remain  in effect (meaning this test can be used) for the duration of the COVID-19 declaration under Section 56 4(b)(1) of the Act, 21 U.S.C. section 360bbb-3(b)(1), unless the authorization is terminated or revoked sooner. Performed at [facility], [place] [address] [place], [place] [address]     Coagulation Studies: No results for input(s): LABPROT, INR in the last 72 hours.  Urinalysis: No results for input(s): COLORURINE, LABSPEC, PHURINE, GLUCOSEU, HGBUR, BILIRUBINUR, KETONESUR, PROTEINUR, UROBILINOGEN, NITRITE, LEUKOCYTESUR in the last 72 hours.  Invalid input(s): APPERANCEUR    Imaging: PERIPHERAL VASCULAR CATHETERIZATION  Result Date: 04/17/2019 See op note    Medications:   . sodium chloride    . cefTRIAXone (ROCEPHIN)  IV 1 g (04/26/2019 1633)   . allopurinol  100 mg Oral Daily  . aspirin  81 mg Oral Daily  . atorvastatin  20 mg Oral Daily  . Chlorhexidine Gluconate Cloth  6 each Topical Q0600  . cholecalciferol  2,000 Units Oral Daily  . fentaNYL      . ferrous sulfate  325 mg Oral BID  . furosemide  40 mg Intravenous BID  . heparin      . heparin  5,000 Units Subcutaneous Q8H  . insulin aspart  0-15 Units Subcutaneous TID WC  . midazolam      . midodrine  5 mg Oral TID WC  . patiromer  16.8 g Oral Daily  . sodium chloride flush  3 mL Intravenous Q12H  . vitamin B-12  1,000 mcg Oral Daily   sodium chloride, acetaminophen, ALPRAZolam, HYDROcodone-acetaminophen, HYDROmorphone (DILAUDID) injection, ondansetron (ZOFRAN) IV, ondansetron (ZOFRAN) IV, ondansetron **OR** [DISCONTINUED] ondansetron (ZOFRAN) IV, senna-docusate, sodium chloride flush  Assessment/ Plan:  [name] is a 79 y.o. black female with hypertension, diabetes mellitus type II, congestive heart failure, gout, hyperlipidemia, pacemaker, back surgery who is admitted to [facility] on 04/29/2019  for Hyperkalemia [E87.5] Weakness [Z30.8] Chronic diastolic congestive heart failure ([place]) [I50.32] AKI (acute kidney injury) ([place]) [N17.9] Fall, initial encounter [W19.XXXA] Acute on chronic diastolic (congestive) heart failure ([place]) [I50.33]  1. 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  - patiromer  - Initiated dialysis this admission.  - Outpatient planning for [facility] in [place], [place].  Patient's primary Nephrologist, Dr. Lindalou Hose with [facility] [place], [place])  2. Hypotension: with acute exacerbation of diastolic congestive heart failure. Home regimen of amlodipine, metoprolol, torsemide and hydralazine.  Home regimen held.  - IV furosemide - midodrine.   3. Diabetes mellitus type II with chronic kidney disease: hemoglobin A1c of 6.5%. Suspect this is low due to renal insufficiency.   4. Secondary  Hyperparathyroidism: PTH 107. Low for level of renal dysfunction. Phosphorus and calcium at goal.   5. Anemia with chronic kidney disease: hemoglobin stable.   Normocytic. No indication for EPO.    LOS: 4 Anand Tejada 4/8/20214:36 PM

## 2019-04-14 NOTE — Progress Notes (Signed)
Pt arrived to unit with eyes closed but arousable to tactile stimuli. Pt is restless and attempting to take oxygen off. Easily redirected.

## 2019-04-14 NOTE — Progress Notes (Signed)
Patient had perm cath placed today and first time dialysis, patient is hard to arouse and is disoriented to time and place. She was has been lethargic since i took over at 1900 but I am unable to get any information from her but her name. Michele Handing NP notified. She is at the bedside to assess. Patient is exhibited signs of a CVA.

## 2019-04-14 NOTE — Progress Notes (Signed)
HD treatment completed. Pt more alert to self only.

## 2019-04-14 NOTE — Progress Notes (Signed)
Patient has returned from [place]. Patient is very lethargic, VSS. Follow some commands.

## 2019-04-14 NOTE — Progress Notes (Signed)
Patient O2 sats during procedure were recorded as lower than actual due to probe being off of finger. Since O2 sats were not optimal, patient was placed on non-rebreather to ensure proper oxygenation.

## 2019-04-14 NOTE — Op Note (Signed)
OPERATIVE NOTE    PRE-OPERATIVE DIAGNOSIS: 1. ESRD   POST-OPERATIVE DIAGNOSIS: same as above  PROCEDURE: 1. Ultrasound guidance for vascular access to the right internal jugular vein 2. Fluoroscopic guidance for placement of catheter 3. Placement of a 19 cm tip to cuff tunneled hemodialysis catheter via the right internal jugular vein  SURGEON: Leotis Pain, MD  ANESTHESIA:  Local with Moderate conscious sedation for approximately 15 minutes using 2 mg of Versed and 50 mcg of Fentanyl  ESTIMATED BLOOD LOSS: 25 cc  FLUORO TIME: less than one minute  CONTRAST: none  FINDING(S): 1.  Patent right internal jugular vein  SPECIMEN(S):  None  INDICATIONS:   Michele Beck is a 79 y.o.female who presents with renal failure who needs to start dialysis.  The patient needs long term dialysis access for their ESRD, and a Permcath is necessary.  Risks and benefits are discussed and informed consent is obtained.    DESCRIPTION: After obtaining full informed written consent, the patient was brought back to the vascular suited. The patient's right neck and chest were sterilely prepped and draped in a sterile surgical field was created. Moderate conscious sedation was administered during a face to face encounter with the patient throughout the procedure with my supervision of the RN administering medicines and monitoring the patient's vital signs, pulse oximetry, telemetry and mental status throughout from the start of the procedure until the patient was taken to the recovery room.  The right internal jugular vein was visualized with ultrasound and found to be patent. It was then accessed under direct ultrasound guidance and a permanent image was recorded. A wire was placed. After skin nick and dilatation, the peel-away sheath was placed over the wire. I then turned my attention to an area under the clavicle. Approximately 1-2 fingerbreadths below the clavicle a small counterincision was created and  tunneled from the subclavicular incision to the access site. Using fluoroscopic guidance, a 19 centimeter tip to cuff tunneled hemodialysis catheter was selected, and tunneled from the subclavicular incision to the access site. It was then placed through the peel-away sheath and the peel-away sheath was removed. Using fluoroscopic guidance the catheter tips were parked in the right atrium. The appropriate distal connectors were placed. It withdrew blood well and flushed easily with heparinized saline and a concentrated heparin solution was then placed. It was secured to the chest wall with 2 Prolene sutures. The access incision was closed single 4-0 Monocryl. A 4-0 Monocryl pursestring suture was placed around the exit site. Sterile dressings were placed. The patient tolerated the procedure well and was taken to the recovery room in stable condition.  COMPLICATIONS: None  CONDITION: Stable  Leotis Pain, MD 05/06/2019 11:07 AM   This note was created with Dragon Medical transcription system. Any errors in dictation are purely unintentional.

## 2019-04-14 NOTE — Progress Notes (Signed)
Pt opens eyes with verbal command. Still with frequent movement, taking off nasal canula. Easily reoriented.

## 2019-04-14 NOTE — Consult Note (Signed)
[facility] Vascular Consult Note  MRN : [id number]  Nicki Furlan is a 79 y.o. (Mar 24, 1940) female who presents with chief complaint of  Chief Complaint  Patient presents with  . Weakness  . Fall   History of Present Illness:  The patient is a 79 year old female with multiple medical issues (see below) who presented to the [facility] emergency department with chief complaint of "weakness".  Patient endorses a fall the night prior to admission due to "weakness".  Patient notes sustaining a fall was too weak to stand.  Aside from her complaint of weakness she is asymptomatic.  Denies headache, visual changes chest pain or shortness of breath, abdominal pain nausea vomiting.  Patient with known history of congestive heart failure and has noted some increased peripheral edema.  During inpatient stay, found to be in acute chronic kidney injury.  At baseline kidney disease stage IV. Baseline creatinine of 2.56, GFR of 17 on 01/23/2019.  Now with uremic symptoms.  Patient with increased creatinine and potassium.  Lasix has been ineffective. At this time, the patient and nephrology would like to initiate dialysis outpatient does not have adequate dialysis access.  Vascular surgery was consulted by Dr. Juleen China for placement of dialysis access.  Current Facility-Administered Medications  Medication Dose Route Frequency Provider Last Rate Last Admin  . [MAR Hold] 0.9 %  sodium chloride infusion  250 mL Intravenous PRN Judd Gaudier V, MD      . 0.9 %  sodium chloride infusion   Intravenous Continuous Jaysen Wey, Janalyn Harder, PA-C      . [MAR Hold] acetaminophen (TYLENOL) tablet 650 mg  650 mg Oral Q4H PRN Athena Masse, MD      . Doug Sou Hold] allopurinol (ZYLOPRIM) tablet 100 mg  100 mg Oral Daily Judd Gaudier V, MD   100 mg at 04/13/19 1019  . [MAR Hold] ALPRAZolam (XANAX) tablet 0.25 mg  0.25 mg Oral BID PRN Athena Masse, MD      . Doug Sou Hold]  aspirin chewable tablet 81 mg  81 mg Oral Daily Judd Gaudier V, MD   81 mg at 04/13/19 1019  . [MAR Hold] atorvastatin (LIPITOR) tablet 20 mg  20 mg Oral Daily Judd Gaudier V, MD   20 mg at 04/13/19 1019  . ceFAZolin (ANCEF) IVPB 1 g/50 mL premix  1 g Intravenous Once Alexzia Kasler A, PA-C      . [MAR Hold] cholecalciferol (VITAMIN D3) tablet 2,000 Units  2,000 Units Oral Daily Athena Masse, MD   2,000 Units at 04/13/19 1019  . diphenhydrAMINE (BENADRYL) injection 50 mg  50 mg Intravenous Once PRN Kiyona Mcnall A, PA-C      . famotidine (PEPCID) tablet 40 mg  40 mg Oral Once PRN Reika Callanan, Janalyn Harder, PA-C      . [MAR Hold] ferrous sulfate tablet 325 mg  325 mg Oral BID Athena Masse, MD   325 mg at 04/13/19 2109  . [MAR Hold] furosemide (LASIX) injection 40 mg  40 mg Intravenous BID Athena Masse, MD   40 mg at 04/13/19 1721  . [MAR Hold] heparin injection 5,000 Units  5,000 Units Subcutaneous Q8H Athena Masse, MD   5,000 Units at 04/28/2019 0503  . [MAR Hold] HYDROcodone-acetaminophen (NORCO/VICODIN) 5-325 MG per tablet 1-2 tablet  1-2 tablet Oral Q4H PRN Athena Masse, MD      . Doug Sou Hold] HYDROmorphone (DILAUDID) injection 1 mg  1 mg Intravenous Once PRN  Javar Eshbach, Janalyn Harder, PA-C      . [MAR Hold] insulin aspart (novoLOG) injection 0-15 Units  0-15 Units Subcutaneous TID WC Athena Masse, MD   2 Units at 04/13/19 0825  . methylPREDNISolone sodium succinate (SOLU-MEDROL) 125 mg/2 mL injection 125 mg  125 mg Intravenous Once PRN Lurlie Wigen A, PA-C      . midazolam (VERSED) 2 MG/ML syrup 8 mg  8 mg Oral Once PRN Saachi Zale, Janalyn Harder, PA-C      . [MAR Hold] midodrine (PROAMATINE) tablet 5 mg  5 mg Oral TID WC Kolluru, Sarath, MD   5 mg at 04/13/19 1721  . [MAR Hold] ondansetron (ZOFRAN) injection 4 mg  4 mg Intravenous Q6H PRN Athena Masse, MD   4 mg at 04/11/19 2017  . [MAR Hold] ondansetron (ZOFRAN) injection 4 mg  4 mg Intravenous Q6H PRN Deaundra Dupriest,  Janalyn Harder, PA-C      . [MAR Hold] ondansetron (ZOFRAN) tablet 4 mg  4 mg Oral Q6H PRN Athena Masse, MD      . Doug Sou Hold] patiromer [facility]) packet 16.8 g  16.8 g Oral Daily Kolluru, Sarath, MD      . Doug Sou Hold] senna-docusate (Senokot-S) tablet 1 tablet  1 tablet Oral QHS PRN Athena Masse, MD      . Doug Sou Hold] sodium chloride flush (NS) 0.9 % injection 3 mL  3 mL Intravenous Q12H Athena Masse, MD   3 mL at 04/13/19 2109  . [MAR Hold] sodium chloride flush (NS) 0.9 % injection 3 mL  3 mL Intravenous PRN Athena Masse, MD      . Doug Sou Hold] vitamin B-12 (CYANOCOBALAMIN) tablet 1,000 mcg  1,000 mcg Oral Daily Athena Masse, MD   1,000 mcg at 04/13/19 1020   Past Medical History:  Diagnosis Date  . Arthritis   . CHF (congestive heart failure) ([place])   . Diabetes mellitus without complication ([place])   . Hypertension   . Renal disorder    Past Surgical History:  Procedure Laterality Date  . BACK SURGERY    . PACEMAKER PLACEMENT    . TUBAL LIGATION     Social History Social History   Tobacco Use  . Smoking status: Never Smoker  . Smokeless tobacco: Never Used  Substance Use Topics  . Alcohol use: Never  . Drug use: Never   Family History History reviewed. No pertinent family history.  Denies family history of peripheral artery disease, renal disease or venous disease.  No Known Allergies  REVIEW OF SYSTEMS (Negative unless checked)  Constitutional: [] Weight loss  [] Fever  [] Chills Cardiac: [] Chest pain   [] Chest pressure   [] Palpitations   [] Shortness of breath when laying flat   [] Shortness of breath at rest   [] Shortness of breath with exertion. Vascular:  [] Pain in legs with walking   [] Pain in legs at rest   [] Pain in legs when laying flat   [] Claudication   [] Pain in feet when walking  [] Pain in feet at rest  [] Pain in feet when laying flat   [] History of DVT   [] Phlebitis   [x] Swelling in legs   [] Varicose veins   [] Non-healing ulcers Pulmonary:   [] Uses home  oxygen   [] Productive cough   [] Hemoptysis   [] Wheeze  [] COPD   [] Asthma Neurologic:  [] Dizziness  [] Blackouts   [] Seizures   [] History of stroke   [] History of TIA  [] Aphasia   [] Temporary blindness   [] Dysphagia   [] Weakness or  numbness in arms   [] Weakness or numbness in legs Musculoskeletal:  [] Arthritis   [] Joint swelling   [] Joint pain   [] Low back pain Hematologic:  [] Easy bruising  [] Easy bleeding   [] Hypercoagulable state   [] Anemic  [] Hepatitis Gastrointestinal:  [] Blood in stool   [] Vomiting blood  [] Gastroesophageal reflux/heartburn   [] Difficulty swallowing. Genitourinary:  [x] Chronic kidney disease   [] Difficult urination  [] Frequent urination  [] Burning with urination   [] Blood in urine Skin:  [] Rashes   [] Ulcers   [] Wounds Psychological:  [] History of anxiety   []  History of major depression.  Physical Examination  Vitals:   04/16/2019 0507 04/12/2019 0518 05/02/2019 0722 05/04/2019 0857  BP: (!) 134/55  (!) 112/97 (!) 129/59  Pulse: 71 65 68 65  Resp:   18 14  Temp: 97.7 F (36.5 C)  97.9 F (36.6 C) 97.7 F (36.5 C)  TempSrc: Oral   Oral  SpO2: 90% 94% 97% 97%  Weight:    107.7 kg  Height:    5\' 3"  (1.6 m)   Body mass index is 42.06 kg/m. Gen:  WD/WN, NAD Head: [place]/AT, No temporalis wasting. Prominent temp pulse not noted. Ear/Nose/Throat: Hearing grossly intact, nares w/o erythema or drainage, oropharynx w/o Erythema/Exudate Eyes: Sclera non-icteric, conjunctiva clear Neck: Trachea midline.  No JVD.  Pulmonary:  Good air movement, respirations not labored, equal bilaterally.  Cardiac: RRR, normal S1, S2. Vascular:  Vessel Right Left  Radial Palpable Palpable  Ulnar Palpable Palpable  Brachial Palpable Palpable  Carotid Palpable, without bruit Palpable, without bruit  Aorta Not palpable N/A  Femoral Palpable Palpable  Popliteal Palpable Palpable  PT Palpable Palpable  DP Palpable Palpable   Gastrointestinal: soft, non-tender/non-distended. No guarding/reflex.   Musculoskeletal: M/S 5/5 throughout.  Extremities without ischemic changes.  No deformity or atrophy. Moderate edema. Neurologic: Sensation grossly intact in extremities.  Symmetrical.  Speech is fluent. Motor exam as listed above. Psychiatric: Judgment intact, Mood & affect appropriate for pt's clinical situation. Dermatologic: No rashes or ulcers noted.  No cellulitis or open wounds. Lymph : No Cervical, Axillary, or Inguinal lymphadenopathy.  CBC Lab Results  Component Value Date   WBC 6.6 04/13/2019   HGB 11.7 (L) 04/13/2019   HCT 36.0 04/13/2019   MCV 90.7 04/13/2019   PLT 180 04/13/2019   BMET    Component Value Date/Time   NA 139 04/07/2019 0604   K 5.4 (H) 04/18/2019 0604   CL 99 04/13/2019 0604   CO2 26 04/13/2019 0604   GLUCOSE 94 04/24/2019 0604   BUN 80 (H) 04/30/2019 0604   CREATININE 5.32 (H) 04/15/2019 0604   CALCIUM 9.5 04/19/2019 0604   GFRNONAA 7 (L) 04/30/2019 0604   GFRAA 8 (L) 04/27/2019 0604   Estimated Creatinine Clearance: 10.2 mL/min (A) (by C-G formula based on SCr of 5.32 mg/dL (H)).  COAG No results found for: INR, PROTIME  Radiology DG Chest 1 View  Result Date: 05/05/2019 CLINICAL DATA:  Low back pain and bilateral hip pain. EXAM: CHEST  1 VIEW COMPARISON:  02/21/2019 FINDINGS: Stable positioning of single lead cardiac pacemaker. The cardiac silhouette is enlarged. Mediastinal contours appear intact. There is no evidence of focal airspace consolidation, pleural effusion or pneumothorax. Increased interstitial markings with central predominance. Osseous structures are without acute abnormality. Soft tissues are grossly normal. IMPRESSION: 1. Enlarged cardiac silhouette. 2. Increased interstitial markings with central predominance may represent pulmonary vascular congestion. Electronically Signed   By: Fidela Salisbury M.D.   On: 04/20/2019 17:33  DG Lumbar Spine 2-3 Views  Result Date: 05/04/2019 CLINICAL DATA:  Post fall with low back and  bilateral hip pain. Weakness for several days. Fall last night. Lumbosacral back pain, bilateral hip pain, and knee pain. Bilateral lower extremity swelling. EXAM: LUMBAR SPINE - 2-3 VIEW COMPARISON:  None. FINDINGS: Straightening of normal lordosis. No listhesis. Posterior rod and intrapedicular screw fusion at L4-L5, hardware is intact. Diffuse degenerative disc disease with disc space narrowing and endplate spurring. Facet hypertrophy at L2-L3 and L3-L4. Vertebral body heights are preserved. No acute fracture. Sacroiliac joints are congruent. IMPRESSION: 1. No evidence of acute fracture of the lumbar spine. 2. Posterior fusion at L4-L5. Multilevel degenerative disc disease and facet hypertrophy. Electronically Signed   By: Keith Rake M.D.   On: 05/06/2019 17:34   DG Pelvis 1-2 Views  Result Date: 04/23/2019 CLINICAL DATA:  Post fall with low back and bilateral hip pain. Weakness for several days. Fall last night. Lumbosacral back pain, bilateral hip pain, and knee pain. Bilateral lower extremity swelling. EXAM: PELVIS - 1-2 VIEW COMPARISON:  None. FINDINGS: The cortical margins of the bony pelvis are intact. No fracture. Pubic symphysis and sacroiliac joints are congruent. Both femoral heads are well-seated in the respective acetabula. Age related degenerative change of both acetabular spurring. IMPRESSION: Negative for pelvic fracture. Electronically Signed   By: Keith Rake M.D.   On: 05/06/2019 17:35   ECHOCARDIOGRAM COMPLETE  Result Date: 04/12/2019    ECHOCARDIOGRAM REPORT   Patient Name:   CHERRON BLITZER Date of Exam: 04/11/2019 Medical Rec #:  [id number]       Height:       63.0 in Accession #:    [id number]      Weight:       236.0 lb Date of Birth:  [birth year]       BSA:          2.075 m Patient Age:    46 years        BP:           107/58 mmHg Patient Gender: F               HR:           50 bpm. Exam Location:  [facility] Procedure: 2D Echo, Cardiac Doppler and Color Doppler Indications:      CHF-Acute Diastolic 322.02 / R42.70  History:         Patient has prior history of Echocardiogram examinations. CHF;                  Risk Factors:Hypertension.  Sonographer:     Alyse Low Roar Referring Phys:  [id number] Athena Masse Diagnosing Phys: Nelva Bush MD IMPRESSIONS  1. Left ventricular ejection fraction, by estimation, is 55 to 60%. The left ventricle has normal function. Left ventricular endocardial border not optimally defined to evaluate regional wall motion. There is mild left ventricular hypertrophy. Left ventricular diastolic parameters are consistent with Grade I diastolic dysfunction (impaired relaxation). There is the interventricular septum is flattened in systole and diastole, consistent with right ventricular pressure and volume overload.  2. Right ventricular systolic function was not well visualized. The right ventricular size is moderately enlarged. There is moderately elevated pulmonary artery systolic pressure. The estimated right ventricular systolic pressure is 31.5 mmHg.  3. Right atrial size was severely dilated.  4. The mitral valve is normal in structure. Trivial mitral valve regurgitation. No evidence of mitral stenosis.  5. Tricuspid valve regurgitation is  severe.  6. The aortic valve is tricuspid. Aortic valve regurgitation is mild to moderate. Mild aortic valve sclerosis is present, with no evidence of aortic valve stenosis.  7. Mildly dilated pulmonary artery.  8. The inferior vena cava is dilated in size with <50% respiratory variability, suggesting right atrial pressure of 15 mmHg. FINDINGS  Left Ventricle: Left ventricular ejection fraction, by estimation, is 55 to 60%. The left ventricle has normal function. Left ventricular endocardial border not optimally defined to evaluate regional wall motion. The left ventricular internal cavity size was normal in size. There is mild left ventricular hypertrophy. The interventricular septum is flattened in systole and diastole,  consistent with right ventricular pressure and volume overload. Left ventricular diastolic parameters are consistent with Grade I diastolic dysfunction (impaired relaxation). Right Ventricle: The right ventricular size is moderately enlarged. No increase in right ventricular wall thickness. Right ventricular systolic function was not well visualized. There is moderately elevated pulmonary artery systolic pressure. The tricuspid regurgitant velocity is 3.22 m/s, and with an assumed right atrial pressure of 15 mmHg, the estimated right ventricular systolic pressure is 70.6 mmHg. Left Atrium: Left atrial size was normal in size. Right Atrium: Right atrial size was severely dilated. Pericardium: Trivial pericardial effusion is present. Mitral Valve: The mitral valve is normal in structure. Trivial mitral valve regurgitation. No evidence of mitral valve stenosis. Tricuspid Valve: The tricuspid valve is normal in structure. Tricuspid valve regurgitation is severe. Aortic Valve: The aortic valve is tricuspid. . There is mild thickening and mild calcification of the aortic valve. Aortic valve regurgitation is mild to moderate. Aortic regurgitation PHT measures 495 msec. Mild aortic valve sclerosis is present, with no evidence of aortic valve stenosis. There is mild thickening of the aortic valve. There is mild calcification of the aortic valve. Aortic valve mean gradient measures 4.0 mmHg. Aortic valve peak gradient measures 8.9 mmHg. Aortic valve area, by VTI measures 1.21 cm. Pulmonic Valve: The pulmonic valve was normal in structure. Pulmonic valve regurgitation is mild to moderate. No evidence of pulmonic stenosis. Aorta: The aortic root is normal in size and structure. Pulmonary Artery: The pulmonary artery is mildly dilated. Venous: The inferior vena cava is dilated in size with less than 50% respiratory variability, suggesting right atrial pressure of 15 mmHg. IAS/Shunts: There is left bowing of the interatrial  septum, suggestive of elevated right atrial pressure. The interatrial septum was not well visualized. Additional Comments: A pacer wire is visualized in the right ventricle.  LEFT VENTRICLE PLAX 2D LVIDd:         4.04 cm  Diastology LVIDs:         2.84 cm  LV e' lateral:   7.40 cm/s LV PW:         1.16 cm  LV E/e' lateral: 5.5 LV IVS:        1.19 cm  LV e' medial:    4.79 cm/s LVOT diam:     1.70 cm  LV E/e' medial:  8.5 LV SV:         42 LV SV Index:   20 LVOT Area:     2.27 cm  RIGHT VENTRICLE RV Mid diam:    4.71 cm RV S prime:     12.50 cm/s TAPSE (M-mode): 2.5 cm LEFT ATRIUM             Index       RIGHT ATRIUM           Index LA diam:  4.10 cm 1.98 cm/m  RA Area:     30.00 cm LA Vol (A2C):   52.7 ml 25.40 ml/m RA Volume:   108.00 ml 52.06 ml/m LA Vol (A4C):   55.1 ml 26.56 ml/m LA Biplane Vol: 56.4 ml 27.19 ml/m  AORTIC VALVE                   PULMONIC VALVE AV Area (Vmax):    1.29 cm    PV Vmax:        1.06 m/s AV Area (Vmean):   1.23 cm    PV Peak grad:   4.5 mmHg AV Area (VTI):     1.21 cm    RVOT Peak grad: 1 mmHg AV Vmax:           149.00 cm/s AV Vmean:          92.700 cm/s AV VTI:            0.348 m AV Peak Grad:      8.9 mmHg AV Mean Grad:      4.0 mmHg LVOT Vmax:         85.00 cm/s LVOT Vmean:        50.300 cm/s LVOT VTI:          0.185 m LVOT/AV VTI ratio: 0.53 AI PHT:            495 msec  AORTA Ao Root diam: 3.00 cm MITRAL VALVE               TRICUSPID VALVE MV Area (PHT): 2.82 cm    TR Peak grad:   41.5 mmHg MV Decel Time: 269 msec    TR Vmax:        322.00 cm/s MV E velocity: 40.70 cm/s MV A velocity: 76.20 cm/s  SHUNTS MV E/A ratio:  0.53        Systemic VTI:  0.18 m                            Systemic Diam: 1.70 cm Nelva Bush MD Electronically signed by Nelva Bush MD Signature Date/Time: 04/12/2019/7:12:53 AM    Final    DG Femur Min 2 Views Left  Result Date: 05/04/2019 CLINICAL DATA:  Post fall with low back and bilateral hip pain. Weakness for several days. Fall  last night. Lumbosacral back pain, bilateral hip pain, and knee pain. Bilateral lower extremity swelling. EXAM: LEFT FEMUR 2 VIEWS COMPARISON:  None. FINDINGS: Cortical margins of the left femur are intact. There is no evidence of fracture or other focal bone lesions. Hip and knee alignment are maintained. Curvilinear density adjacent to the distal medial femoral condyle is consistent with remote MCL injury. Diffuse soft tissue edema of the lower extremities nonspecific. There are vascular calcifications. IMPRESSION: 1. No acute fracture of the left femur. 2. Diffuse soft tissue edema, nonspecific. Electronically Signed   By: Keith Rake M.D.   On: 04/20/2019 17:36   DG Femur Min 2 Views Right  Result Date: 05/04/2019 CLINICAL DATA:  Post fall with low back and bilateral hip pain. Weakness for several days. Fall last night. Lumbosacral back pain, bilateral hip pain, and knee pain. Bilateral lower extremity swelling. EXAM: RIGHT FEMUR 2 VIEWS COMPARISON:  None. FINDINGS: Cortical margins of the right femur are intact. There is no evidence of fracture or other focal bone lesions. Hip and knee alignment are maintained. Moderate osteoarthritis of the knee. Generalized soft  tissue edema is nonspecific. There are vascular calcifications. IMPRESSION: 1. No fracture of the right femur. 2. Nonspecific generalized soft tissue edema. Electronically Signed   By: Keith Rake M.D.   On: 04/15/2019 17:37   Assessment/Plan The patient is a 79 year old female who initially presented [facility] emergency department with a chief complaint of weakness.  Patient found to have acute on chronic renal failure now requiring hemodialysis  1.  Acute on chronic kidney disease: The patient and nephrology would like to initiate dialysis at this time however the patient does not have an adequate dialysis access.  We will place a PermCath so the patient initiate dialysis at this time.  Procedure, risks and  benefits have been to the patient.  All questions answered.  Patient wants to proceed.  2.  Diabetes: On appropriate medications. Encouraged good control as its slows the progression of atherosclerotic disease.  3.  Hypertension: On appropriate medications. Encouraged good control as its slows the progression of atherosclerotic disease.  Discussed with Dr. Mayme Genta, PA-C  04/24/2019 9:46 AM  This note was created with Dragon medical transcription system.  Any error is purely unintentional.

## 2019-04-14 NOTE — Progress Notes (Signed)
OVERNIGHT Called by nursing app [id number] regarding patient lethargic post first HD treatment today and now unable to wake up and state he rname Bedside patient lethartgic. Awakens to noxious stimuli. Confused. Noted oral secretions ffrom right side of mouth when she put her head to midline position upon wakenin. Slight right lower labial droop. Tongue midline.  Preferential head preference to keep head to right.  Would not look to left without significant stimulus. Left upper arm drift. Moved lower extremities with foot plantar flexion and knee flexion, very weak, felt equal in strength. Last known well time unknown CBG 115  BMP no significant abnormals. Electrolytes normal limits CT head w/o contrast - negative for acute findings, chronic white matter changes - MRI Ordered to follow up   After few hours nursing reports patient back to baseline, alert, oriented, no focal deficits and ambulated without difficulty to bathroom  Metabolic encephalopathy induced by hemodialysis most likely diagnosis for altered mental state however, will still plan for MRI follow up secondary to focal deficit findings on initial exam concerning for TIA/CVA

## 2019-04-14 NOTE — Progress Notes (Signed)
Pt takes nasal canula off when moving around (desats to 87%). Placed on pt and writer reminded pt of the importance of wearing oxygen.

## 2019-04-14 NOTE — Progress Notes (Signed)
PROGRESS NOTE    Michele Beck   UQJ:[id number]  DOB: 01-Jun-1940  PCP: System, Pcp Not In    DOA: 04/25/2019 LOS: 4   Brief Narrative   Michele Beck is an 79 y.o. femalewith past medical history significant forCKD 4, diastolic CHF (admitted in Jan 2021 for decompensation), pulmonary HTN, DM 2, HTN, gout and obesity. Patient has been feeling weak for some time, seen in the emergency room 04/02/2019 with concern for worsening lower extremity edema, referred to the heart failure clinic where she was treated with IV Lasix.  Now presented to the ED on 4/4 following a fall at home while trying to get up from her recliner.  In the ED, UA concerning for UTI, creatinine above baseline CKD.  Admitted to hospitalist service for further evaluation and management.   Assessment & Plan   Principal Problem:   UTI (urinary tract infection) Active Problems:   Type 2 diabetes mellitus with stage 4 chronic kidney disease (HCC)   Essential hypertension   Pulmonary HTN (HCC)   Acute on chronic diastolic heart failure (HCC)   AKI (acute kidney injury) ([place])   Generalized weakness   Fall at home, initial encounter   Obesity, Class III, BMI 40-49.9 (morbid obesity) (HCC)   CKD (chronic kidney disease) stage 4, GFR 15-29 ml/min (HCC)   Acute on chronic diastolic (congestive) heart failure (HCC)   Hyperkalemia   Sinus bradycardia   UTI (urinary tract infection) -presented with generalized weakness status post fall.  Urinalysis concerning for UTI with moderate leukocytes, many bacteria and greater than 50 WBCs. -Continue Rocephin  --Follow-up urine culture  Acute on chronic diastolic heart failure  Severe pulmonary hypertension Echocardiogram showed EF 55 to 38%, grade 1 diastolic dysfunction, severe tricuspid regurgitation --Continue IV Lasix if BP allows --Continue Midodrine  --daily weights, strict I/O's  AKI (acute kidney injury) vs progression of CKD to stage V Hyperkalemia Permcath  placed this AM, patient to have initial dialysis later today. --Continue IV Lasix as BP allows.   --Continue Veltassa. --Consult nephrology --Avoid nephrotoxins and hypotension. No ACE inhibitor/ARB.  Sinus bradycardia - asymptomatic.   --Hold metoprolol. --Telemetry monitoring  Generalized weakness, right knee arthritis Fall at home, subsequent encounter besity, Class III, BMI 40-49.9 (morbid obesity)  Morbid obesity a complicating factor PT and OT evaluations are pending due to hyperkalemia and renal failure  Type 2 diabetes mellitus  Insulin sliding scale as needed  Essential hypertension -Holding amlodipine and hydralazine due to soft blood pressures  Patient BMI: Body mass index is 42.07 kg/m.   DVT prophylaxis: Heparin Diet:  Diet Orders (From admission, onward)    Start     Ordered   05/02/2019 0001  Diet NPO time specified  Diet effective midnight     04/13/19 1059            Code Status: Full Code    Subjective 04/30/2019    Patient sleeping when seen and examined at bedside this morning.  No acute events reported overnight.  States some pain at Permcath site, just placed earlier this AM.  Denies other complaints.   Disposition Plan & Communication   Dispo & Barriers: Pending clinical improvement and clearance by nephrology.  Patient starting on dialysis. Coming from: Home Exp d/c date: TBD Medically stable for d/c?  No  Family Communication: None at bedside during encounter, will attempt to call   Consults, Procedures, Significant Events   Consultants:   Nephrology  Procedures:   None  Antimicrobials:  None   Objective   Vitals:   04/23/2019 0500 04/13/2019 0507 04/30/2019 0518 04/25/2019 0722  BP:  (!) 134/55  (!) 112/97  Pulse:  71 65 68  Resp:    18  Temp:  97.7 F (36.5 C)  97.9 F (36.6 C)  TempSrc:  Oral    SpO2:  90% 94% 97%  Weight: 107.7 kg     Height:        Intake/Output Summary (Last 24 hours) at 04/13/2019 0741 Last  data filed at 04/13/2019 0500 Gross per 24 hour  Intake --  Output 550 ml  Net -550 ml   Filed Weights   04/12/19 0444 04/13/19 0339 05/01/2019 0500  Weight: 107.7 kg 107.9 kg 107.7 kg    Physical Exam:  General exam: sleeping awakes to tactile stimulus, no acute distress, obese Respiratory system: Diminished at the bases, no wheezes or rhonchi, normal respiratory effort. Cardiovascular system: normal S1/S2, RRR, bilateral lower extremity pitting edema, right upper chest with permcath in place, not bleeding.   Gastrointestinal system: soft, NT, ND, no HSM felt, +bowel sounds. Central nervous system: A&O x4. no gross focal neurologic deficits, normal speech Extremities: moves all, no cyanosis, normal tone Psychiatry: normal mood, congruent affect  Labs   Data Reviewed: I have personally reviewed following labs and imaging studies  CBC: Recent Labs  Lab 04/16/2019 1613 04/12/19 0643 04/13/19 0612  WBC 7.9  --  6.6  NEUTROABS 6.3  --  5.3  HGB 12.0 10.8* 11.7*  HCT 36.7 34.4* 36.0  MCV 91.3  --  90.7  PLT 218  --  711   Basic Metabolic Panel: Recent Labs  Lab 04/15/2019 1613 04/11/19 0508 04/12/19 0643 04/13/19 0612 04/17/2019 0604  NA 142 140 138 137 139  K 5.7* 5.7* 5.7* 5.6* 5.4*  CL 102 103 100 99 99  CO2 29 25 25 27 26   GLUCOSE 120* 149* 127* 124* 94  BUN 68* 72* 73* 78* 80*  CREATININE 4.85* 5.04* 5.05* 5.28* 5.32*  CALCIUM 9.9 9.2 9.1 9.3 9.5  PHOS  --   --  5.2*  --   --    GFR: Estimated Creatinine Clearance: 10.2 mL/min (A) (by C-G formula based on SCr of 5.32 mg/dL (H)). Liver Function Tests: Recent Labs  Lab 04/20/2019 1613  AST 26  ALT 26  ALKPHOS 110  BILITOT 0.8  PROT 7.3  ALBUMIN 4.3   No results for input(s): LIPASE, AMYLASE in the last 168 hours. No results for input(s): AMMONIA in the last 168 hours. Coagulation Profile: No results for input(s): INR, PROTIME in the last 168 hours. Cardiac Enzymes: Recent Labs  Lab 04/07/2019 1613  CKTOTAL  146   BNP (last 3 results) No results for input(s): PROBNP in the last 8760 hours. HbA1C: No results for input(s): HGBA1C in the last 72 hours. CBG: Recent Labs  Lab 04/13/19 0748 04/13/19 1153 04/13/19 1625 04/13/19 2123 04/13/2019 0717  GLUCAP 128* 98 93 98 90   Lipid Profile: No results for input(s): CHOL, HDL, LDLCALC, TRIG, CHOLHDL, LDLDIRECT in the last 72 hours. Thyroid Function Tests: No results for input(s): TSH, T4TOTAL, FREET4, T3FREE, THYROIDAB in the last 72 hours. Anemia Panel: No results for input(s): VITAMINB12, FOLATE, FERRITIN, TIBC, IRON, RETICCTPCT in the last 72 hours. Sepsis Labs: Recent Labs  Lab 04/15/2019 1613  LATICACIDVEN 1.3    Recent Results (from the past 240 hour(s))  SARS CORONAVIRUS 2 (TAT 6-24 HRS) Nasopharyngeal Nasopharyngeal Swab     Status: None  Collection Time: 04/29/2019  9:28 PM   Specimen: Nasopharyngeal Swab  Result Value Ref Range Status   SARS Coronavirus 2 NEGATIVE NEGATIVE Final    Comment: (NOTE) SARS-CoV-2 target nucleic acids are NOT DETECTED. The SARS-CoV-2 RNA is generally detectable in upper and lower respiratory specimens during the acute phase of infection. Negative results do not preclude SARS-CoV-2 infection, do not rule out co-infections with other pathogens, and should not be used as the sole basis for treatment or other patient management decisions. Negative results must be combined with clinical observations, patient history, and epidemiological information. The expected result is Negative. Fact Sheet for Patients: SugarRoll.be Fact Sheet for Healthcare Providers: https://www.woods-mathews.com/ This test is not yet approved or cleared by the [place] FDA and  has been authorized for detection and/or diagnosis of SARS-CoV-2 by FDA under an Emergency Use Authorization (EUA). This EUA will remain  in effect (meaning this test can be used) for the duration of  the COVID-19 declaration under Section 56 4(b)(1) of the Act, 21 U.S.C. section 360bbb-3(b)(1), unless the authorization is terminated or revoked sooner. Performed at [facility], [place] [address] [place], [place] [address]       Imaging Studies   No results found.   Medications   Scheduled Meds: . allopurinol  100 mg Oral Daily  . aspirin  81 mg Oral Daily  . atorvastatin  20 mg Oral Daily  . cholecalciferol  2,000 Units Oral Daily  . ferrous sulfate  325 mg Oral BID  . furosemide  40 mg Intravenous BID  . heparin  5,000 Units Subcutaneous Q8H  . insulin aspart  0-15 Units Subcutaneous TID WC  . midodrine  5 mg Oral TID WC  . patiromer  16.8 g Oral Daily  . sodium chloride flush  3 mL Intravenous Q12H  . vitamin B-12  1,000 mcg Oral Daily   Continuous Infusions: . sodium chloride         LOS: 4 days    Time spent: 40 minutes    Ezekiel Slocumb, DO [facility]   If 7PM-7AM, please contact night-coverage [email or web address] 04/08/2019, 7:41 AM

## 2019-04-15 DIAGNOSIS — N186 End stage renal disease: Secondary | ICD-10-CM

## 2019-04-15 LAB — CBC
HCT: 32.2 % — ABNORMAL LOW (ref 36.0–46.0)
Hemoglobin: 10.3 g/dL — ABNORMAL LOW (ref 12.0–15.0)
MCH: 30.4 pg (ref 26.0–34.0)
MCHC: 32 g/dL (ref 30.0–36.0)
MCV: 95 fL (ref 80.0–100.0)
Platelets: 196 10*3/uL (ref 150–400)
RBC: 3.39 MIL/uL — ABNORMAL LOW (ref 3.87–5.11)
RDW: 17.7 % — ABNORMAL HIGH (ref 11.5–15.5)
WBC: 8 10*3/uL (ref 4.0–10.5)
nRBC: 1.1 % — ABNORMAL HIGH (ref 0.0–0.2)

## 2019-04-15 LAB — BASIC METABOLIC PANEL
Anion gap: 11 (ref 5–15)
BUN: 59 mg/dL — ABNORMAL HIGH (ref 8–23)
CO2: 29 mmol/L (ref 22–32)
Calcium: 8.7 mg/dL — ABNORMAL LOW (ref 8.9–10.3)
Chloride: 99 mmol/L (ref 98–111)
Creatinine, Ser: 4.48 mg/dL — ABNORMAL HIGH (ref 0.44–1.00)
GFR calc Af Amer: 10 mL/min — ABNORMAL LOW (ref >60)
GFR calc non Af Amer: 9 mL/min — ABNORMAL LOW (ref >60)
Glucose, Bld: 116 mg/dL — ABNORMAL HIGH (ref 70–99)
Potassium: 4.6 mmol/L (ref 3.5–5.1)
Sodium: 139 mmol/L (ref 135–145)

## 2019-04-15 LAB — GLUCOSE, CAPILLARY
Glucose-Capillary: 88 mg/dL (ref 70–99)
Glucose-Capillary: 129 mg/dL — ABNORMAL HIGH (ref 70–99)
Glucose-Capillary: 91 mg/dL (ref 70–99)
Glucose-Capillary: 102 mg/dL — ABNORMAL HIGH (ref 70–99)
Glucose-Capillary: 130 mg/dL — ABNORMAL HIGH (ref 70–99)

## 2019-04-15 LAB — URINE CULTURE: Culture: NO GROWTH

## 2019-04-15 NOTE — Progress Notes (Signed)
Patient has awaken about 0130. Patient is alert and oriented and able to follow commands. Patient has ambulated to the [facility] with a walker. Patient noted she had some confusion earlier this shift. Patient is currently resting in bed.

## 2019-04-15 NOTE — Progress Notes (Signed)
Unable to scan MRI until we have information on the patient's pacemaker and can verify it's compatibility, RN informed. Pt does not know pacemaker info and says the card is at home

## 2019-04-15 NOTE — Progress Notes (Signed)
[facility]  ROUNDING NOTE   Subjective:   Seen and examined on hemodialysis treatment. Tolerating treatment well. Second treatment. Yesterday was first dialysis treatment    HEMODIALYSIS FLOWSHEET:  Blood Flow Rate (mL/min): 250 mL/min Arterial Pressure (mmHg): -80 mmHg Venous Pressure (mmHg): 70 mmHg Transmembrane Pressure (mmHg): 40 mmHg Ultrafiltration Rate (mL/min): 400 mL/min Dialysate Flow Rate (mL/min): 500 ml/min Conductivity: Machine : 14 Conductivity: Machine : 14 Dialysis Fluid Bolus: Normal Saline Bolus Amount (mL): 250 mL    Objective:  Vital signs in last 24 hours:  Temp:  [97.5 F (36.4 C)-97.9 F (36.6 C)] 97.6 F (36.4 C) (04/09 1000) Pulse Rate:  [56-73] 72 (04/09 1245) Resp:  [9-19] 19 (04/09 1245) BP: (100-152)/(46-86) 115/61 (04/09 1245) SpO2:  [90 %-100 %] 100 % (04/09 1245)  Weight change: -0.029 kg Filed Weights   04/13/19 0339 04/07/2019 0500 04/11/2019 0857  Weight: 107.9 kg 107.7 kg 107.7 kg    Intake/Output: I/O last 3 completed shifts: In: -  Out: 550 [Urine:550]   Intake/Output this shift:  No intake/output data recorded.  Physical Exam: General: NAD,   Head: Normocephalic, atraumatic. Moist oral mucosal membranes  Eyes: Anicteric, PERRL  Neck: Supple, trachea midline  Lungs:  Bilateral crackles  Heart: Regular rate and rhythm  Abdomen:  Soft, nontender,   Extremities:  + peripheral edema.  Neurologic: Nonfocal, moving all four extremities  Skin: No lesions  Access: RIJ permcath 4/8 Dr. Lucky Cowboy    Basic Metabolic Panel: Recent Labs  Lab 04/12/19 5361 04/12/19 4431 04/13/19 5400 04/13/19 0612 05/04/2019 0604 04/30/2019 2253 04/15/19 0804  NA 138  --  137  --  139 139 139  K 5.7*  --  5.6*  --  5.4* 4.5 4.6  CL 100  --  99  --  99 98 99  CO2 25  --  27  --  26 30 29   GLUCOSE 127*  --  124*  --  94 114* 116*  BUN 73*  --  78*  --  80* 60* 59*  CREATININE 5.05*  --  5.28*  --  5.32* 4.08* 4.48*  CALCIUM 9.1   <  > 9.3   < > 9.5 8.8* 8.7*  PHOS 5.2*  --   --   --   --   --   --    < > = values in this interval not displayed.    Liver Function Tests: Recent Labs  Lab 05/01/2019 1613  AST 26  ALT 26  ALKPHOS 110  BILITOT 0.8  PROT 7.3  ALBUMIN 4.3   No results for input(s): LIPASE, AMYLASE in the last 168 hours. No results for input(s): AMMONIA in the last 168 hours.  CBC: Recent Labs  Lab 05/02/2019 1613 04/12/19 0643 04/13/19 0612 04/15/19 0804  WBC 7.9  --  6.6 8.0  NEUTROABS 6.3  --  5.3  --   HGB 12.0 10.8* 11.7* 10.3*  HCT 36.7 34.4* 36.0 32.2*  MCV 91.3  --  90.7 95.0  PLT 218  --  180 196    Cardiac Enzymes: Recent Labs  Lab 04/17/2019 1613  CKTOTAL 146    BNP: Invalid input(s): POCBNP  CBG: Recent Labs  Lab 04/27/2019 1351 05/06/2019 1622 04/27/2019 2103 04/08/2019 2343 04/15/19 0912  GLUCAP 88 84 115* 119* 129*    Microbiology: Results for orders placed or performed during the hospital encounter of 04/24/2019  SARS CORONAVIRUS 2 (TAT 6-24 HRS) Nasopharyngeal Nasopharyngeal Swab     Status:  None   Collection Time: 04/08/2019  9:28 PM   Specimen: Nasopharyngeal Swab  Result Value Ref Range Status   SARS Coronavirus 2 NEGATIVE NEGATIVE Final    Comment: (NOTE) SARS-CoV-2 target nucleic acids are NOT DETECTED. The SARS-CoV-2 RNA is generally detectable in upper and lower respiratory specimens during the acute phase of infection. Negative results do not preclude SARS-CoV-2 infection, do not rule out co-infections with other pathogens, and should not be used as the sole basis for treatment or other patient management decisions. Negative results must be combined with clinical observations, patient history, and epidemiological information. The expected result is Negative. Fact Sheet for Patients: SugarRoll.be Fact Sheet for Healthcare Providers: https://www.woods-mathews.com/ This test is not yet approved or cleared by the  [place] FDA and  has been authorized for detection and/or diagnosis of SARS-CoV-2 by FDA under an Emergency Use Authorization (EUA). This EUA will remain  in effect (meaning this test can be used) for the duration of the COVID-19 declaration under Section 56 4(b)(1) of the Act, 21 U.S.C. section 360bbb-3(b)(1), unless the authorization is terminated or revoked sooner. Performed at [facility], [place] [address], [place], [place] [address]   Urine Culture     Status: None   Collection Time: 04/13/19  9:11 PM   Specimen: Urine, Random  Result Value Ref Range Status   Specimen Description   Final    URINE, RANDOM Performed at [facility], [address], [place], [place] [address]    Special Requests   Final    NONE Performed at [facility], [address], [place], [place] [address]    Culture   Final    NO GROWTH Performed at [facility], [place] [address], [place], [place] [address]    Report Status 04/15/2019 FINAL  Final    Coagulation Studies: No results for input(s): LABPROT, INR in the last 72 hours.  Urinalysis: No results for input(s): COLORURINE, LABSPEC, PHURINE, GLUCOSEU, HGBUR, BILIRUBINUR, KETONESUR, PROTEINUR, UROBILINOGEN, NITRITE, LEUKOCYTESUR in the last 72 hours.  Invalid input(s): APPERANCEUR    Imaging: CT HEAD WO CONTRAST  Result Date: 04/13/2019 CLINICAL DATA:  79 year old female first-time dialysis patient today. Altered mental status. EXAM: CT HEAD WITHOUT CONTRAST TECHNIQUE: Contiguous axial images were obtained from the base of the skull through the vertex without intravenous contrast. COMPARISON:  None. FINDINGS: Brain: No midline shift, ventriculomegaly, mass effect, evidence of mass lesion, intracranial hemorrhage or evidence of cortically based acute infarction. Patchy bilateral white matter hypodensity. No cortical encephalomalacia identified. Vascular: Calcified atherosclerosis at the skull base. No suspicious  intracranial vascular hyperdensity. Skull: Negative skull. Partially visible degenerative changes in the upper cervical spine. Sinuses/Orbits: Visualized paranasal sinuses and mastoids are well pneumatized. Other: No acute orbit or scalp soft tissue findings. IMPRESSION: 1. No acute intracranial abnormality identified. 2. Bilateral cerebral white matter changes, most commonly due to chronic small vessel disease. Electronically Signed   By: Genevie Ann M.D.   On: 04/20/2019 23:21   PERIPHERAL VASCULAR CATHETERIZATION  Result Date: 04/22/2019 See op note    Medications:   . sodium chloride    . cefTRIAXone (ROCEPHIN)  IV 1 g (04/13/2019 1633)   . allopurinol  100 mg Oral Daily  . aspirin  81 mg Oral Daily  . atorvastatin  20 mg Oral Daily  . Chlorhexidine Gluconate Cloth  6 each Topical Q0600  . cholecalciferol  2,000 Units Oral Daily  . ferrous sulfate  325 mg Oral BID  .  furosemide  40 mg Intravenous BID  . heparin  5,000 Units Subcutaneous Q8H  . insulin aspart  0-15 Units Subcutaneous TID WC  . midodrine  5 mg Oral TID WC  . patiromer  16.8 g Oral Daily  . sodium chloride flush  3 mL Intravenous Q12H  . vitamin B-12  1,000 mcg Oral Daily   sodium chloride, acetaminophen, ALPRAZolam, HYDROcodone-acetaminophen, HYDROmorphone (DILAUDID) injection, ondansetron (ZOFRAN) IV, ondansetron (ZOFRAN) IV, ondansetron **OR** [DISCONTINUED] ondansetron (ZOFRAN) IV, senna-docusate, sodium chloride flush  Assessment/ Plan:  Michele Beck is a 79 y.o. black female with hypertension, diabetes mellitus type II, congestive heart failure, gout, hyperlipidemia, pacemaker, back surgery who is admitted to [facility] on 04/17/2019 for Hyperkalemia [E87.5] Weakness [H41.7] Chronic diastolic congestive heart failure ([place]) [I50.32] AKI (acute kidney injury) ([place]) [N17.9] Fall, initial encounter [W19.XXXA] Acute on chronic diastolic (congestive) heart failure ([place]) [I50.33]  1. 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  Initiated dialysis on 4/8.  - Outpatient planning for [facility] in [place], [place].  Patient's primary Nephrologist, Dr. Lindalou Hose with [facility] [place], [place])  2. Hypotension: with acute exacerbation of diastolic congestive heart failure. Home regimen of amlodipine, metoprolol, torsemide and hydralazine.  Home regimen held.  - IV furosemide - midodrine.   3. Diabetes mellitus type II with chronic kidney disease: hemoglobin A1c of 6.5%. Suspect this is low due to renal insufficiency.   4. Secondary Hyperparathyroidism: PTH 107. Low for level of renal dysfunction. Phosphorus and calcium at goal.   5. Anemia with chronic kidney disease: hemoglobin stable.  10.3 Normocytic. No indication for EPO.    LOS: 5 Aulani Shipton 4/9/20211:50 PM

## 2019-04-15 NOTE — Progress Notes (Signed)
   04/15/19 1247  Hand-Off documentation  Handoff Given Given to shift RN/LPN  Report given to (Full Name) Janett Billow  Handoff Received Received from shift RN/LPN  Report received from (Full Name) Lenette Rau  Vital Signs  Temp 97.6 F (36.4 C)  Temp Source Oral  Pulse Rate 71  Pulse Rate Source Monitor  Resp 15  BP 128/66  BP Location Right Arm  BP Method Automatic  Patient Position (if appropriate) Lying  Oxygen Therapy  SpO2 100 %  O2 Device Nasal Cannula  O2 Flow Rate (L/min) 3 L/min  Pain Assessment  Pain Scale 0-10  Pain Score 0  During Hemodialysis Assessment  Blood Flow Rate (mL/min) 150 mL/min  Arterial Pressure (mmHg) 20 mmHg  Venous Pressure (mmHg) 10 mmHg  Transmembrane Pressure (mmHg) 40 mmHg  Ultrafiltration Rate (mL/min) 0 mL/min  Dialysate Flow Rate (mL/min) 500 ml/min  Conductivity: Machine  14  HD Safety Checks Performed Yes  KECN 32 KECN  Dialysis Fluid Bolus Normal Saline  Bolus Amount (mL) 250 mL  Intra-Hemodialysis Comments Tolerated well;Tx completed  Post-Hemodialysis Assessment  Rinseback Volume (mL) 250 mL  KECN 32 V  Dialyzer Clearance Clear  Duration of HD Treatment -hour(s) 2.5 hour(s)  Hemodialysis Intake (mL) 500 mL  UF Total -Machine (mL) 1000 mL  Net UF (mL) 500 mL  Tolerated HD Treatment Yes  Post-Hemodialysis Comments 2nd treatment tolerated well  Education / Care Plan  Dialysis Education Provided Yes  Note  Observations no problems to note 2nd treatment tolerated well  Hemodialysis Catheter Right Internal jugular Double lumen Permanent (Tunneled)  Placement Date/Time: 04/17/2019 1029   Time Out: Correct patient;Correct site;Correct procedure  Maximum sterile barrier precautions: Hand hygiene;Cap;Mask;Sterile gown;Sterile gloves;Large sterile sheet  Site Prep: Chlorhexidine (preferred)  Local Anes...  Site Condition No complications  Blue Lumen Status Capped (Central line);Heparin locked  Red Lumen Status Capped (Central line);Heparin  locked  Catheter fill solution Heparin 1000 units/ml  Catheter fill volume (Arterial) 1.6 cc  Catheter fill volume (Venous) 1.6  Dressing Type Gauze/Drain sponge  Dressing Status Clean;Dry;Intact  Post treatment catheter status Capped and Clamped  TREATMENT COMPLETED TOLERATED WELL 2ND TREATMENT DONE REMOVED 500ML

## 2019-04-15 NOTE — Care Management Important Message (Signed)
Important Message  Patient Details  Name: Michele Beck MRN: [id number] Date of Birth: [birth year]   Medicare Important Message Given:  Yes     Dannette Barbara 04/15/2019, 2:02 PM

## 2019-04-15 NOTE — Progress Notes (Addendum)
PROGRESS NOTE    Phelicia Dantes   AJG:[id number]  DOB: [birth year]  PCP: System, Pcp Not In    DOA: 04/11/2019 LOS: 5   Brief Narrative   Clytie Shetley is an 79 y.o. femalewith past medical history significant forCKD 4, diastolic CHF (admitted in Jan 2021 for decompensation), pulmonary HTN, DM 2, HTN, gout and obesity. Patient has been feeling weak for some time, seen in the emergency room 04/02/2019 with concern for worsening lower extremity edema, referred to the heart failure clinic where she was treated with IV Lasix.  Now presented to the ED on 4/4 following a fall at home while trying to get up from her recliner.  In the ED, UA concerning for UTI, creatinine above baseline CKD.  Admitted to hospitalist service for further evaluation and management.   Assessment & Plan   Principal Problem:   ESRD (end stage renal disease) ([place]) Active Problems:   Type 2 diabetes mellitus with stage 4 chronic kidney disease (HCC)   Essential hypertension   Pulmonary HTN (HCC)   Acute on chronic diastolic heart failure (HCC)   AKI (acute kidney injury) ([place])   UTI (urinary tract infection)   Generalized weakness   Fall at home, initial encounter   Obesity, Class III, BMI 40-49.9 (morbid obesity) ([place])   Acute on chronic diastolic (congestive) heart failure (HCC)   Hyperkalemia   Sinus bradycardia  ESRD - now on dialysis, initiated on 4/8, 2nd session 4/9.  Permcath placed 4/8. Hyperkalemia - due to above --Nephrology following --Dialysis here, outpatient being arranged  --[facility] --Consult nephrology --Avoid nephrotoxins and hypotension. No ACE inhibitor/ARB.  UTI (urinary tract infection) -presented with generalized weakness status post fall.  Urinalysis concerning for UTI with moderate leukocytes, many bacteria and greater than 50 WBCs.  Urine culture returned negative.  Stop antibiotics and monitor.   Acute on chronic diastolic heart failure  Severe pulmonary  hypertension Echocardiogram showed EF 55 to 35%, grade 1 diastolic dysfunction, severe tricuspid regurgitation --Continue IV Lasix if BP allows --Continue Midodrine  --daily weights, strict I/O's  Sinus bradycardia - asymptomatic.   --Hold metoprolol. --Telemetry monitoring  Generalized weakness, right knee arthritis Fall at home, subsequent encounter besity, Class III, BMI 40-49.9 (morbid obesity)  Morbid obesity a complicating factor PT and OT evaluations are pending due to hyperkalemia and renal failure  Type 2 diabetes mellitus  Insulin sliding scale as needed  Essential hypertension -Holding amlodipine and hydralazine due to soft blood pressures  Patient BMI: Body mass index is 42.06 kg/m.   DVT prophylaxis: Heparin Diet:  Diet Orders (From admission, onward)    Start     Ordered   05/01/2019 1115  Diet renal/carb modified with fluid restriction Diet-HS Snack? Nothing; Fluid restriction: 1200 mL Fluid; Room service appropriate? Yes; Fluid consistency: Thin  Diet effective now    Question Answer Comment  Diet-HS Snack? Nothing   Fluid restriction: 1200 mL Fluid   Room service appropriate? Yes   Fluid consistency: Thin      05/03/2019 1114            Code Status: Full Code    Subjective 04/15/19    Patient sleeping when seen and examined at bedside this morning.  No acute events reported overnight.  She was lethargic overnight, got CT head which was negative.  This AM, mentation at baseline and patient without complaints.  Reports she was just tired.  Denies fever, chills or other acute complaints.   Disposition Plan & Communication  Dispo & Barriers: Pending arrangement of outpatient dialysis and clearance by nephrology.  Patient newly started on dialysis and will remain in hospital for this until outpatient chair ready for her. Coming from: Home Exp d/c date: TBD Medically stable for d/c?  No  Family Communication: None at bedside during encounter, will  attempt to call   Consults, Procedures, Significant Events   Consultants:   Nephrology  Procedures:   None  Antimicrobials:   None   Objective   Vitals:   04/15/19 1245 04/15/19 1247 04/15/19 1624 04/15/19 1909  BP: 115/61 128/66 (!) 112/57 126/61  Pulse: 72 71 70 74  Resp: 19 15 18    Temp:  97.6 F (36.4 C) (!) 97.3 F (36.3 C) 97.8 F (36.6 C)  TempSrc:  Oral  Oral  SpO2: 100% 100% 100% 100%  Weight:      Height:        Intake/Output Summary (Last 24 hours) at 04/15/2019 2011 Last data filed at 04/15/2019 1247 Gross per 24 hour  Intake --  Output 500 ml  Net -500 ml   Filed Weights   04/13/19 0339 04/22/2019 0500 05/01/2019 0857  Weight: 107.9 kg 107.7 kg 107.7 kg    Physical Exam:  General exam: awake, no acute distress, obese Respiratory system: Diminished at the bases, no wheezes or rhonchi, normal respiratory effort. Cardiovascular system: normal S1/S2, RRR, bilateral lower extremity pitting edema, right upper chest with permcath in place. Central nervous system: A&O x3. no gross focal neurologic deficits, normal speech Extremities: moves all, no cyanosis, normal tone Psychiatry: normal mood, congruent affect  Labs   Data Reviewed: I have personally reviewed following labs and imaging studies  CBC: Recent Labs  Lab 04/27/2019 1613 04/12/19 0643 04/13/19 0612 04/15/19 0804  WBC 7.9  --  6.6 8.0  NEUTROABS 6.3  --  5.3  --   HGB 12.0 10.8* 11.7* 10.3*  HCT 36.7 34.4* 36.0 32.2*  MCV 91.3  --  90.7 95.0  PLT 218  --  180 035   Basic Metabolic Panel: Recent Labs  Lab 04/12/19 0643 04/13/19 0612 05/06/2019 0604 04/29/2019 2253 04/15/19 0804  NA 138 137 139 139 139  K 5.7* 5.6* 5.4* 4.5 4.6  CL 100 99 99 98 99  CO2 25 27 26 30 29   GLUCOSE 127* 124* 94 114* 116*  BUN 73* 78* 80* 60* 59*  CREATININE 5.05* 5.28* 5.32* 4.08* 4.48*  CALCIUM 9.1 9.3 9.5 8.8* 8.7*  PHOS 5.2*  --   --   --   --    GFR: Estimated Creatinine Clearance: 12.2 mL/min  (A) (by C-G formula based on SCr of 4.48 mg/dL (H)). Liver Function Tests: Recent Labs  Lab 04/30/2019 1613  AST 26  ALT 26  ALKPHOS 110  BILITOT 0.8  PROT 7.3  ALBUMIN 4.3   No results for input(s): LIPASE, AMYLASE in the last 168 hours. No results for input(s): AMMONIA in the last 168 hours. Coagulation Profile: No results for input(s): INR, PROTIME in the last 168 hours. Cardiac Enzymes: Recent Labs  Lab 04/07/2019 1613  CKTOTAL 146   BNP (last 3 results) No results for input(s): PROBNP in the last 8760 hours. HbA1C: No results for input(s): HGBA1C in the last 72 hours. CBG: Recent Labs  Lab 04/17/2019 2103 04/09/2019 2343 04/15/19 0912 04/15/19 1442 04/15/19 1649  GLUCAP 115* 119* 129* 91 102*   Lipid Profile: No results for input(s): CHOL, HDL, LDLCALC, TRIG, CHOLHDL, LDLDIRECT in the last 72 hours.  Thyroid Function Tests: No results for input(s): TSH, T4TOTAL, FREET4, T3FREE, THYROIDAB in the last 72 hours. Anemia Panel: No results for input(s): VITAMINB12, FOLATE, FERRITIN, TIBC, IRON, RETICCTPCT in the last 72 hours. Sepsis Labs: Recent Labs  Lab 04/22/2019 1613  LATICACIDVEN 1.3    Recent Results (from the past 240 hour(s))  SARS CORONAVIRUS 2 (TAT 6-24 HRS) Nasopharyngeal Nasopharyngeal Swab     Status: None   Collection Time: 04/22/2019  9:28 PM   Specimen: Nasopharyngeal Swab  Result Value Ref Range Status   SARS Coronavirus 2 NEGATIVE NEGATIVE Final    Comment: (NOTE) SARS-CoV-2 target nucleic acids are NOT DETECTED. The SARS-CoV-2 RNA is generally detectable in upper and lower respiratory specimens during the acute phase of infection. Negative results do not preclude SARS-CoV-2 infection, do not rule out co-infections with other pathogens, and should not be used as the sole basis for treatment or other patient management decisions. Negative results must be combined with clinical observations, patient history, and epidemiological information. The  expected result is Negative. Fact Sheet for Patients: SugarRoll.be Fact Sheet for Healthcare Providers: https://www.woods-mathews.com/ This test is not yet approved or cleared by the [place] FDA and  has been authorized for detection and/or diagnosis of SARS-CoV-2 by FDA under an Emergency Use Authorization (EUA). This EUA will remain  in effect (meaning this test can be used) for the duration of the COVID-19 declaration under Section 56 4(b)(1) of the Act, 21 U.S.C. section 360bbb-3(b)(1), unless the authorization is terminated or revoked sooner. Performed at [facility], [place] [address] [place], [place] [address]   Urine Culture     Status: None   Collection Time: 04/13/19  9:11 PM   Specimen: Urine, Random  Result Value Ref Range Status   Specimen Description   Final    URINE, RANDOM Performed at [facility], [address] [place], [place] [address]    Special Requests   Final    NONE Performed at [facility], [address], [place],  [address]    Culture   Final    NO GROWTH Performed at [facility], [place] [address] [place],  [address]    Report Status 04/15/2019 FINAL  Final      Imaging Studies   CT HEAD WO CONTRAST  Result Date: 04/07/2019 CLINICAL DATA:  79 year old female first-time dialysis patient today. Altered mental status. EXAM: CT HEAD WITHOUT CONTRAST TECHNIQUE: Contiguous axial images were obtained from the base of the skull through the vertex without intravenous contrast. COMPARISON:  None. FINDINGS: Brain: No midline shift, ventriculomegaly, mass effect, evidence of mass lesion, intracranial hemorrhage or evidence of cortically based acute infarction. Patchy bilateral white matter hypodensity. No cortical encephalomalacia identified. Vascular: Calcified atherosclerosis at the skull base. No suspicious intracranial vascular hyperdensity. Skull: Negative skull.  Partially visible degenerative changes in the upper cervical spine. Sinuses/Orbits: Visualized paranasal sinuses and mastoids are well pneumatized. Other: No acute orbit or scalp soft tissue findings. IMPRESSION: 1. No acute intracranial abnormality identified. 2. Bilateral cerebral white matter changes, most commonly due to chronic small vessel disease. Electronically Signed   By: Genevie Ann M.D.   On: 04/09/2019 23:21   PERIPHERAL VASCULAR CATHETERIZATION  Result Date: 04/15/2019 See op note    Medications   Scheduled Meds: . allopurinol  100 mg Oral Daily  . aspirin  81 mg Oral Daily  . atorvastatin  20 mg Oral Daily  . Chlorhexidine Gluconate Cloth  6 each Topical Q0600  .  cholecalciferol  2,000 Units Oral Daily  . ferrous sulfate  325 mg Oral BID  . furosemide  40 mg Intravenous BID  . heparin  5,000 Units Subcutaneous Q8H  . insulin aspart  0-15 Units Subcutaneous TID WC  . midodrine  5 mg Oral TID WC  . patiromer  16.8 g Oral Daily  . sodium chloride flush  3 mL Intravenous Q12H  . vitamin B-12  1,000 mcg Oral Daily   Continuous Infusions: . sodium chloride         LOS: 5 days    Time spent: 30 minutes    Ezekiel Slocumb, DO [facility]   If 7PM-7AM, please contact night-coverage [email or web address] 04/15/2019, 8:11 PM

## 2019-04-16 LAB — GLUCOSE, CAPILLARY
Glucose-Capillary: 98 mg/dL (ref 70–99)
Glucose-Capillary: 97 mg/dL (ref 70–99)
Glucose-Capillary: 114 mg/dL — ABNORMAL HIGH (ref 70–99)
Glucose-Capillary: 102 mg/dL — ABNORMAL HIGH (ref 70–99)

## 2019-04-16 LAB — QUANTIFERON-TB GOLD PLUS (RQFGPL)
QuantiFERON Mitogen Value: >10 IU/mL
QuantiFERON Nil Value: 0.03 IU/mL
QuantiFERON TB1 Ag Value: 0.03 IU/mL
QuantiFERON TB2 Ag Value: 0.02 IU/mL

## 2019-04-16 LAB — RENAL FUNCTION PANEL
Albumin: 3 g/dL — ABNORMAL LOW (ref 3.5–5.0)
Anion gap: 11 (ref 5–15)
BUN: 34 mg/dL — ABNORMAL HIGH (ref 8–23)
CO2: 29 mmol/L (ref 22–32)
Calcium: 8.5 mg/dL — ABNORMAL LOW (ref 8.9–10.3)
Chloride: 99 mmol/L (ref 98–111)
Creatinine, Ser: 2.74 mg/dL — ABNORMAL HIGH (ref 0.44–1.00)
GFR calc Af Amer: 18 mL/min — ABNORMAL LOW (ref >60)
GFR calc non Af Amer: 16 mL/min — ABNORMAL LOW (ref >60)
Glucose, Bld: 115 mg/dL — ABNORMAL HIGH (ref 70–99)
Phosphorus: 3.2 mg/dL (ref 2.5–4.6)
Potassium: 4.2 mmol/L (ref 3.5–5.1)
Sodium: 139 mmol/L (ref 135–145)

## 2019-04-16 LAB — CBC
HCT: 29.7 % — ABNORMAL LOW (ref 36.0–46.0)
Hemoglobin: 9.7 g/dL — ABNORMAL LOW (ref 12.0–15.0)
MCH: 30.2 pg (ref 26.0–34.0)
MCHC: 32.7 g/dL (ref 30.0–36.0)
MCV: 92.5 fL (ref 80.0–100.0)
Platelets: 179 10*3/uL (ref 150–400)
RBC: 3.21 MIL/uL — ABNORMAL LOW (ref 3.87–5.11)
RDW: 17.5 % — ABNORMAL HIGH (ref 11.5–15.5)
WBC: 8.4 10*3/uL (ref 4.0–10.5)
nRBC: 1.1 % — ABNORMAL HIGH (ref 0.0–0.2)

## 2019-04-16 LAB — QUANTIFERON-TB GOLD PLUS: QuantiFERON-TB Gold Plus: NEGATIVE

## 2019-04-16 MED ORDER — FUROSEMIDE 20 MG PO TABS
80.0000 mg | ORAL_TABLET | Freq: Two times a day (BID) | ORAL | Status: DC
  Administered 2019-04-16 – 2019-04-18 (×4): 80 mg via ORAL
  Filled 2019-04-16: qty 1
  Filled 2019-04-16: qty 2
  Filled 2019-04-16 (×4): qty 1
  Filled 2019-04-16 (×2): qty 2

## 2019-04-16 MED ORDER — EPOETIN ALFA 10000 UNIT/ML IJ SOLN
4000.0000 [IU] | INTRAMUSCULAR | Status: DC
  Administered 2019-04-16 – 2019-04-19 (×2): 4000 [IU] via INTRAVENOUS
  Filled 2019-04-16: qty 1

## 2019-04-16 NOTE — Progress Notes (Signed)
This note also relates to the following rows which could not be included: Pulse Rate - Cannot attach notes to unvalidated device data Resp - Cannot attach notes to unvalidated device data SpO2 - Cannot attach notes to unvalidated device data  Hd started  

## 2019-04-16 NOTE — Progress Notes (Signed)
[facility]  ROUNDING NOTE   Subjective:   Seen and examined on hemodialysis treatment.   She feels better since starting HD Breathing is better Appetite is far Continues to have LE edema    HEMODIALYSIS FLOWSHEET:  Blood Flow Rate (mL/min): 300 mL/min Arterial Pressure (mmHg): -110 mmHg Venous Pressure (mmHg): 90 mmHg Transmembrane Pressure (mmHg): 50 mmHg Ultrafiltration Rate (mL/min): 500 mL/min Dialysate Flow Rate (mL/min): 600 ml/min Conductivity: Machine : 14.4 Conductivity: Machine : 14.4 Dialysis Fluid Bolus: Normal Saline Bolus Amount (mL): 250 mL    Objective:  Vital signs in last 24 hours:  Temp:  [97.3 F (36.3 C)-98.5 F (36.9 C)] 97.8 F (36.6 C) (04/10 1010) Pulse Rate:  [70-91] 78 (04/10 1045) Resp:  [11-21] 17 (04/10 1045) BP: (112-170)/(52-74) 135/63 (04/10 1045) SpO2:  [99 %-100 %] 99 % (04/10 1015) Weight:  [98.8 kg] 98.8 kg (04/10 0543)  Weight change: -8.861 kg Filed Weights   04/29/2019 0500 05/01/2019 0857 04/16/19 0543  Weight: 107.7 kg 107.7 kg 98.8 kg    Intake/Output: I/O last 3 completed shifts: In: -  Out: 500 [Other:500]   Intake/Output this shift:  No intake/output data recorded.  Physical Exam: General: NAD,   Head: Normocephalic, atraumatic. Moist oral mucosal membranes  Eyes: Anicteric,   Neck: Supple,   Lungs:  [place] O2, normal effort  Heart: Irregular rhythm  Abdomen:  Soft, nontender,   Extremities:  + peripheral edema.  Neurologic: Nonfocal, moving all four extremities  Skin: No lesions  Access: RIJ permcath 4/8 Dr. Lucky Cowboy    Basic Metabolic Panel: Recent Labs  Lab 04/12/19 7169 04/12/19 6789 04/13/19 3810 04/13/19 1751 05/06/2019 0604 04/24/2019 0604 04/24/2019 2253 04/15/19 0804 04/16/19 1019  NA 138   < > 137  --  139  --  139 139 139  K 5.7*   < > 5.6*  --  5.4*  --  4.5 4.6 4.2  CL 100   < > 99  --  99  --  98 99 99  CO2 25   < > 27  --  26  --  30 29 29   GLUCOSE 127*   < > 124*  --  94  --   114* 116* 115*  BUN 73*   < > 78*  --  80*  --  60* 59* 34*  CREATININE 5.05*   < > 5.28*  --  5.32*  --  4.08* 4.48* 2.74*  CALCIUM 9.1   < > 9.3   < > 9.5   < > 8.8* 8.7* 8.5*  PHOS 5.2*  --   --   --   --   --   --   --  3.2   < > = values in this interval not displayed.    Liver Function Tests: Recent Labs  Lab 04/25/2019 1613 04/16/19 1019  AST 26  --   ALT 26  --   ALKPHOS 110  --   BILITOT 0.8  --   PROT 7.3  --   ALBUMIN 4.3 3.0*   No results for input(s): LIPASE, AMYLASE in the last 168 hours. No results for input(s): AMMONIA in the last 168 hours.  CBC: Recent Labs  Lab 04/17/2019 1613 04/12/19 0643 04/13/19 0612 04/15/19 0804 04/16/19 1019  WBC 7.9  --  6.6 8.0 8.4  NEUTROABS 6.3  --  5.3  --   --   HGB 12.0 10.8* 11.7* 10.3* 9.7*  HCT 36.7 34.4* 36.0 32.2* 29.7*  MCV 91.3  --  90.7 95.0 92.5  PLT 218  --  180 196 179    Cardiac Enzymes: Recent Labs  Lab 04/24/2019 1613  CKTOTAL 146    BNP: Invalid input(s): POCBNP  CBG: Recent Labs  Lab 04/15/19 0912 04/15/19 1442 04/15/19 1649 04/15/19 2102 04/16/19 0754  GLUCAP 129* 91 102* 130* 98    Microbiology: Results for orders placed or performed during the hospital encounter of 04/17/2019  SARS CORONAVIRUS 2 (TAT 6-24 HRS) Nasopharyngeal Nasopharyngeal Swab     Status: None   Collection Time: 05/01/2019  9:28 PM   Specimen: Nasopharyngeal Swab  Result Value Ref Range Status   SARS Coronavirus 2 NEGATIVE NEGATIVE Final    Comment: (NOTE) SARS-CoV-2 target nucleic acids are NOT DETECTED. The SARS-CoV-2 RNA is generally detectable in upper and lower respiratory specimens during the acute phase of infection. Negative results do not preclude SARS-CoV-2 infection, do not rule out co-infections with other pathogens, and should not be used as the sole basis for treatment or other patient management decisions. Negative results must be combined with clinical observations, patient history, and  epidemiological information. The expected result is Negative. Fact Sheet for Patients: SugarRoll.be Fact Sheet for Healthcare Providers: https://www.woods-mathews.com/ This test is not yet approved or cleared by the [place] FDA and  has been authorized for detection and/or diagnosis of SARS-CoV-2 by FDA under an Emergency Use Authorization (EUA). This EUA will remain  in effect (meaning this test can be used) for the duration of the COVID-19 declaration under Section 56 4(b)(1) of the Act, 21 U.S.C. section 360bbb-3(b)(1), unless the authorization is terminated or revoked sooner. Performed at [facility], [place] [address] [place], [place] [address]   Urine Culture     Status: None   Collection Time: 04/13/19  9:11 PM   Specimen: Urine, Random  Result Value Ref Range Status   Specimen Description   Final    URINE, RANDOM Performed at [facility], [address], [place], [place] [address]    Special Requests   Final    NONE Performed at [facility], [address], [place], [place] [address]    Culture   Final    NO GROWTH Performed at [facility], [place] [address] [place], [place] [address]    Report Status 04/15/2019 FINAL  Final    Coagulation Studies: No results for input(s): LABPROT, INR in the last 72 hours.  Urinalysis: No results for input(s): COLORURINE, LABSPEC, PHURINE, GLUCOSEU, HGBUR, BILIRUBINUR, KETONESUR, PROTEINUR, UROBILINOGEN, NITRITE, LEUKOCYTESUR in the last 72 hours.  Invalid input(s): APPERANCEUR    Imaging: CT HEAD WO CONTRAST  Result Date: 04/29/2019 CLINICAL DATA:  79 year old female first-time dialysis patient today. Altered mental status. EXAM: CT HEAD WITHOUT CONTRAST TECHNIQUE: Contiguous axial images were obtained from the base of the skull through the vertex without intravenous contrast. COMPARISON:  None. FINDINGS: Brain: No midline shift, ventriculomegaly,  mass effect, evidence of mass lesion, intracranial hemorrhage or evidence of cortically based acute infarction. Patchy bilateral white matter hypodensity. No cortical encephalomalacia identified. Vascular: Calcified atherosclerosis at the skull base. No suspicious intracranial vascular hyperdensity. Skull: Negative skull. Partially visible degenerative changes in the upper cervical spine. Sinuses/Orbits: Visualized paranasal sinuses and mastoids are well pneumatized. Other: No acute orbit or scalp soft tissue findings. IMPRESSION: 1. No acute intracranial abnormality identified. 2. Bilateral cerebral white matter changes, most commonly due to chronic small vessel disease. Electronically Signed   By: Genevie Ann  M.D.   On: 04/15/2019 23:21     Medications:   . sodium chloride     . allopurinol  100 mg Oral Daily  . aspirin  81 mg Oral Daily  . atorvastatin  20 mg Oral Daily  . Chlorhexidine Gluconate Cloth  6 each Topical Q0600  . cholecalciferol  2,000 Units Oral Daily  . ferrous sulfate  325 mg Oral BID  . furosemide  40 mg Intravenous BID  . heparin  5,000 Units Subcutaneous Q8H  . insulin aspart  0-15 Units Subcutaneous TID WC  . midodrine  5 mg Oral TID WC  . patiromer  16.8 g Oral Daily  . sodium chloride flush  3 mL Intravenous Q12H  . vitamin B-12  1,000 mcg Oral Daily   sodium chloride, acetaminophen, ALPRAZolam, HYDROcodone-acetaminophen, HYDROmorphone (DILAUDID) injection, ondansetron (ZOFRAN) IV, ondansetron (ZOFRAN) IV, ondansetron **OR** [DISCONTINUED] ondansetron (ZOFRAN) IV, senna-docusate, sodium chloride flush  Assessment/ Plan:  Ms. Moriah Shawley is a 79 y.o. black female with hypertension, diabetes mellitus type II, congestive heart failure, gout, hyperlipidemia, pacemaker, back surgery who is admitted to [facility] on 04/09/2019 for Hyperkalemia [E87.5] Weakness [B44.9] Chronic diastolic congestive heart failure ([place]) [I50.32] AKI (acute kidney injury) ([place]) [N17.9] Fall,  initial encounter [W19.XXXA] Acute on chronic diastolic (congestive) heart failure ([place]) [I50.33]  #. End stage renal disease with LE edema secondary to diabetes and hypertension:  Seen and examined during dialysis treatment.  Initiated dialysis on 4/8.  - Outpatient planning in progress Patient's primary Nephrologist, Dr. Lindalou Hose with [facility] [place], [place]) - change furosemide to 80 mg PO BID  # Diabetes mellitus type II with chronic kidney disease:  Lab Results  Component Value Date   HGBA1C 6.5 (H) 05/04/2019    #. Secondary Hyperparathyroidism: Lab Results  Component Value Date   PTH 107 (H) 04/12/2019   CALCIUM 8.5 (L) 04/16/2019   PHOS 3.2 04/16/2019  continue ergocalciferol supplements  #. Anemia with chronic kidney disease:  Lab Results  Component Value Date   HGB 9.7 (L) 04/16/2019      LOS: 6 Michele Beck 4/10/202111:02 AM

## 2019-04-16 NOTE — Progress Notes (Signed)
PROGRESS NOTE    Michele Beck   JOA:[id number]  DOB: Mar 04, 1940  PCP: System, Pcp Not In    DOA: 05/06/2019 LOS: 6   Brief Narrative   Michele Beck is an 79 y.o. femalewith past medical history significant forCKD 4, diastolic CHF (admitted in Jan 2021 for decompensation), pulmonary HTN, DM 2, HTN, gout and obesity. Patient has been feeling weak for some time, seen in the emergency room 04/02/2019 with concern for worsening lower extremity edema, referred to the heart failure clinic where she was treated with IV Lasix.  Now presented to the ED on 4/4 following a fall at home while trying to get up from her recliner.  In the ED, UA concerning for UTI, creatinine above baseline CKD.  Admitted to hospitalist service for further evaluation and management.   Assessment & Plan   Principal Problem:   ESRD (end stage renal disease) ([place]) Active Problems:   Type 2 diabetes mellitus with stage 4 chronic kidney disease (HCC)   Essential hypertension   Pulmonary HTN (HCC)   Acute on chronic diastolic heart failure (HCC)   AKI (acute kidney injury) ([place])   UTI (urinary tract infection)   Generalized weakness   Fall at home, initial encounter   Obesity, Class III, BMI 40-49.9 (morbid obesity) ([place])   Acute on chronic diastolic (congestive) heart failure (HCC)   Hyperkalemia   Sinus bradycardia  ESRD - now on dialysis, initiated on 4/8, 2nd session 4/9.  Permcath placed and dialysis initiated on 4/8. Hyperkalemia - due to above.  Resolved. --Nephrology following --Dialysis here, outpatient being arranged  --Continue [place] --Avoid nephrotoxins and hypotension. No ACE inhibitor/ARB. --Lasix per nephrology  UTI (urinary tract infection) -presented with generalized weakness status post fall.  Urinalysis concerning for UTI with moderate leukocytes, many bacteria and greater than 50 WBCs.  Urine culture returned negative.  Stop antibiotics and monitor.   Acute on chronic diastolic  heart failure  Severe pulmonary hypertension Echocardiogram showed EF 55 to 60%, grade 1 diastolic dysfunction, severe tricuspid regurgitation --Continue IV Lasix if BP allows --Continue Midodrine  --daily weights, strict I/O's  Sinus bradycardia - asymptomatic.   --Hold metoprolol. --Telemetry monitoring  Generalized weakness, right knee arthritis Fall at home, subsequent encounter besity, Class III, BMI 40-49.9 (morbid obesity)  Morbid obesity a complicating factor PT and OT evaluations are pending due to hyperkalemia and renal failure  Type 2 diabetes mellitus  Insulin sliding scale as needed  Essential hypertension -Holding amlodipine and hydralazine due to soft blood pressures  Patient BMI: Body mass index is 38.6 kg/m.   DVT prophylaxis: Heparin Diet:  Diet Orders (From admission, onward)    Start     Ordered   04/16/2019 1115  Diet renal/carb modified with fluid restriction Diet-HS Snack? Nothing; Fluid restriction: 1200 mL Fluid; Room service appropriate? Yes; Fluid consistency: Thin  Diet effective now    Question Answer Comment  Diet-HS Snack? Nothing   Fluid restriction: 1200 mL Fluid   Room service appropriate? Yes   Fluid consistency: Thin      04/11/2019 1114            Code Status: Full Code    Subjective 04/16/19    Patient seen at the end of her dialysis session today.  She reports feeling well complaints or issues with dialysis so far.  Denies fever, chills, shortness of breath, chest pain, nausea vomiting diarrhea or other acute complaints.   Disposition Plan & Communication   Dispo & Barriers: Pending  arrangement of outpatient dialysis and clearance by nephrology.  Patient newly started on dialysis and needs to remain in hospital for this until outpatient chair ready for her. Coming from: Home Exp d/c date: TBD Medically stable for d/c?  No  Family Communication: None at bedside during encounter, will attempt to call   Consults,  Procedures, Significant Events   Consultants:   Nephrology  Procedures:   None  Antimicrobials:   None   Objective   Vitals:   04/15/19 1624 04/15/19 1909 04/16/19 0543 04/16/19 0753  BP: (!) 112/57 126/61 (!) 128/52 (!) 170/61  Pulse: 70 74 77 91  Resp: 18     Temp: (!) 97.3 F (36.3 C) 97.8 F (36.6 C) 98.5 F (36.9 C) 98 F (36.7 C)  TempSrc:  Oral Oral Oral  SpO2: 100% 100% 100% 100%  Weight:   98.8 kg   Height:        Intake/Output Summary (Last 24 hours) at 04/16/2019 0814 Last data filed at 04/16/2019 0544 Gross per 24 hour  Intake -  Output 500 ml  Net -500 ml   Filed Weights   05/03/2019 0500 04/30/2019 0857 04/16/19 0543  Weight: 107.7 kg 107.7 kg 98.8 kg    Physical Exam:  General exam: awake, no acute distress, obese Respiratory system: Diminished at the bases, no wheezes or rhonchi, normal respiratory effort. Cardiovascular system: normal S1/S2, RRR, bilateral lower extremity 3+ pitting edema, right upper chest with permcath in place. Central nervous system: A&O x3. no gross focal neurologic deficits, normal speech Extremities: moves all, no cyanosis, normal tone Psychiatry: normal mood, congruent affect  Labs   Data Reviewed: I have personally reviewed following labs and imaging studies  CBC: Recent Labs  Lab 05/04/2019 1613 04/12/19 0643 04/13/19 0612 04/15/19 0804  WBC 7.9  --  6.6 8.0  NEUTROABS 6.3  --  5.3  --   HGB 12.0 10.8* 11.7* 10.3*  HCT 36.7 34.4* 36.0 32.2*  MCV 91.3  --  90.7 95.0  PLT 218  --  180 253   Basic Metabolic Panel: Recent Labs  Lab 04/12/19 0643 04/13/19 0612 04/17/2019 0604 04/15/2019 2253 04/15/19 0804  NA 138 137 139 139 139  K 5.7* 5.6* 5.4* 4.5 4.6  CL 100 99 99 98 99  CO2 25 27 26 30 29   GLUCOSE 127* 124* 94 114* 116*  BUN 73* 78* 80* 60* 59*  CREATININE 5.05* 5.28* 5.32* 4.08* 4.48*  CALCIUM 9.1 9.3 9.5 8.8* 8.7*  PHOS 5.2*  --   --   --   --    GFR: Estimated Creatinine Clearance: 11.6 mL/min  (A) (by C-G formula based on SCr of 4.48 mg/dL (H)). Liver Function Tests: Recent Labs  Lab 04/24/2019 1613  AST 26  ALT 26  ALKPHOS 110  BILITOT 0.8  PROT 7.3  ALBUMIN 4.3   No results for input(s): LIPASE, AMYLASE in the last 168 hours. No results for input(s): AMMONIA in the last 168 hours. Coagulation Profile: No results for input(s): INR, PROTIME in the last 168 hours. Cardiac Enzymes: Recent Labs  Lab 04/30/2019 1613  CKTOTAL 146   BNP (last 3 results) No results for input(s): PROBNP in the last 8760 hours. HbA1C: No results for input(s): HGBA1C in the last 72 hours. CBG: Recent Labs  Lab 04/15/19 0912 04/15/19 1442 04/15/19 1649 04/15/19 2102 04/16/19 0754  GLUCAP 129* 91 102* 130* 98   Lipid Profile: No results for input(s): CHOL, HDL, LDLCALC, TRIG, CHOLHDL, LDLDIRECT in  the last 72 hours. Thyroid Function Tests: No results for input(s): TSH, T4TOTAL, FREET4, T3FREE, THYROIDAB in the last 72 hours. Anemia Panel: No results for input(s): VITAMINB12, FOLATE, FERRITIN, TIBC, IRON, RETICCTPCT in the last 72 hours. Sepsis Labs: Recent Labs  Lab 04/09/2019 1613  LATICACIDVEN 1.3    Recent Results (from the past 240 hour(s))  SARS CORONAVIRUS 2 (TAT 6-24 HRS) Nasopharyngeal Nasopharyngeal Swab     Status: None   Collection Time: 04/13/2019  9:28 PM   Specimen: Nasopharyngeal Swab  Result Value Ref Range Status   SARS Coronavirus 2 NEGATIVE NEGATIVE Final    Comment: (NOTE) SARS-CoV-2 target nucleic acids are NOT DETECTED. The SARS-CoV-2 RNA is generally detectable in upper and lower respiratory specimens during the acute phase of infection. Negative results do not preclude SARS-CoV-2 infection, do not rule out co-infections with other pathogens, and should not be used as the sole basis for treatment or other patient management decisions. Negative results must be combined with clinical observations, patient history, and epidemiological information. The expected  result is Negative. Fact Sheet for Patients: SugarRoll.be Fact Sheet for Healthcare Providers: https://www.woods-mathews.com/ This test is not yet approved or cleared by the [place] FDA and  has been authorized for detection and/or diagnosis of SARS-CoV-2 by FDA under an Emergency Use Authorization (EUA). This EUA will remain  in effect (meaning this test can be used) for the duration of the COVID-19 declaration under Section 56 4(b)(1) of the Act, 21 U.S.C. section 360bbb-3(b)(1), unless the authorization is terminated or revoked sooner. Performed at [facility], [place] [address] [place], [place] [address]   Urine Culture     Status: None   Collection Time: 04/13/19  9:11 PM   Specimen: Urine, Random  Result Value Ref Range Status   Specimen Description   Final    URINE, RANDOM Performed at [facility], [address], [place], [place] [address]    Special Requests   Final    NONE Performed at [facility], [address] [place], [place] [address]    Culture   Final    NO GROWTH Performed at [facility], [place] [address] [place], [place] [address]    Report Status 04/15/2019 FINAL  Final      Imaging Studies   CT HEAD WO CONTRAST  Result Date: 05/03/2019 CLINICAL DATA:  79 year old female first-time dialysis patient today. Altered mental status. EXAM: CT HEAD WITHOUT CONTRAST TECHNIQUE: Contiguous axial images were obtained from the base of the skull through the vertex without intravenous contrast. COMPARISON:  None. FINDINGS: Brain: No midline shift, ventriculomegaly, mass effect, evidence of mass lesion, intracranial hemorrhage or evidence of cortically based acute infarction. Patchy bilateral white matter hypodensity. No cortical encephalomalacia identified. Vascular: Calcified atherosclerosis at the skull base. No suspicious intracranial vascular hyperdensity. Skull: Negative skull. Partially  visible degenerative changes in the upper cervical spine. Sinuses/Orbits: Visualized paranasal sinuses and mastoids are well pneumatized. Other: No acute orbit or scalp soft tissue findings. IMPRESSION: 1. No acute intracranial abnormality identified. 2. Bilateral cerebral white matter changes, most commonly due to chronic small vessel disease. Electronically Signed   By: Genevie Ann M.D.   On: 04/24/2019 23:21   PERIPHERAL VASCULAR CATHETERIZATION  Result Date: 04/23/2019 See op note    Medications   Scheduled Meds: . allopurinol  100 mg Oral Daily  . aspirin  81 mg Oral Daily  . atorvastatin  20 mg Oral Daily  . Chlorhexidine Gluconate Cloth  6 each Topical Q0600  . cholecalciferol  2,000 Units Oral Daily  . ferrous sulfate  325 mg Oral BID  . furosemide  40 mg Intravenous BID  . heparin  5,000 Units Subcutaneous Q8H  . insulin aspart  0-15 Units Subcutaneous TID WC  . midodrine  5 mg Oral TID WC  . patiromer  16.8 g Oral Daily  . sodium chloride flush  3 mL Intravenous Q12H  . vitamin B-12  1,000 mcg Oral Daily   Continuous Infusions: . sodium chloride         LOS: 6 days    Time spent: 25 minutes    Ezekiel Slocumb, DO [facility]   If 7PM-7AM, please contact night-coverage [email or web address] 04/16/2019, 8:14 AM

## 2019-04-16 NOTE — Progress Notes (Signed)
Not an accurate reading

## 2019-04-16 NOTE — Progress Notes (Signed)
This note also relates to the following rows which could not be included: Pulse Rate - Cannot attach notes to unvalidated device data Resp - Cannot attach notes to unvalidated device data BP - Cannot attach notes to unvalidated device data  Hd completed  

## 2019-04-17 LAB — CBC
HCT: 29.4 % — ABNORMAL LOW (ref 36.0–46.0)
Hemoglobin: 9.7 g/dL — ABNORMAL LOW (ref 12.0–15.0)
MCH: 30.2 pg (ref 26.0–34.0)
MCHC: 33 g/dL (ref 30.0–36.0)
MCV: 91.6 fL (ref 80.0–100.0)
Platelets: 184 10*3/uL (ref 150–400)
RBC: 3.21 MIL/uL — ABNORMAL LOW (ref 3.87–5.11)
RDW: 17.3 % — ABNORMAL HIGH (ref 11.5–15.5)
WBC: 9.7 10*3/uL (ref 4.0–10.5)
nRBC: 1.7 % — ABNORMAL HIGH (ref 0.0–0.2)

## 2019-04-17 LAB — BASIC METABOLIC PANEL
Anion gap: 9 (ref 5–15)
BUN: 24 mg/dL — ABNORMAL HIGH (ref 8–23)
CO2: 31 mmol/L (ref 22–32)
Calcium: 8.5 mg/dL — ABNORMAL LOW (ref 8.9–10.3)
Chloride: 100 mmol/L (ref 98–111)
Creatinine, Ser: 2.02 mg/dL — ABNORMAL HIGH (ref 0.44–1.00)
GFR calc Af Amer: 27 mL/min — ABNORMAL LOW (ref >60)
GFR calc non Af Amer: 23 mL/min — ABNORMAL LOW (ref >60)
Glucose, Bld: 102 mg/dL — ABNORMAL HIGH (ref 70–99)
Potassium: 4 mmol/L (ref 3.5–5.1)
Sodium: 140 mmol/L (ref 135–145)

## 2019-04-17 LAB — GLUCOSE, CAPILLARY
Glucose-Capillary: 90 mg/dL (ref 70–99)
Glucose-Capillary: 88 mg/dL (ref 70–99)
Glucose-Capillary: 154 mg/dL — ABNORMAL HIGH (ref 70–99)
Glucose-Capillary: 127 mg/dL — ABNORMAL HIGH (ref 70–99)

## 2019-04-17 MED ORDER — NEPRO/CARBSTEADY PO LIQD
237.0000 mL | Freq: Two times a day (BID) | ORAL | Status: DC
  Administered 2019-04-17: 15:00:00 237 mL via ORAL

## 2019-04-17 NOTE — Progress Notes (Signed)
[facility]  ROUNDING NOTE   Subjective:   Doing fair No acute c/o Able to eat without nausea or vomiting No shortness of breath + leg edema    Objective:  Vital signs in last 24 hours:  Temp:  [97.3 F (36.3 C)-98.1 F (36.7 C)] 97.9 F (36.6 C) (04/11 1154) Pulse Rate:  [85-101] 92 (04/11 1154) Resp:  [16-18] 17 (04/11 1154) BP: (131-154)/(57-88) 140/69 (04/11 1154) SpO2:  [96 %-100 %] 100 % (04/11 1154) Weight:  [105.7 kg] 105.7 kg (04/11 0409)  Weight change: 6.849 kg Filed Weights   04/11/2019 0857 04/16/19 0543 04/17/19 0409  Weight: 107.7 kg 98.8 kg 105.7 kg    Intake/Output: I/O last 3 completed shifts: In: 63 [P.O.:830] Out: 1217 [Urine:300; Other:917]   Intake/Output this shift:  Total I/O In: 0  Out: 400 [Urine:400]  Physical Exam: General: NAD,   Head: Normocephalic, atraumatic. Moist oral mucosal membranes  Eyes: Anicteric,   Neck: Supple,   Lungs:  [place] O2, normal effort  Heart: Irregular rhythm  Abdomen:  Soft, nontender,   Extremities:  + peripheral edema.  Neurologic: Nonfocal, moving all four extremities  Skin: No lesions  Access: RIJ permcath 4/8 Dr. Lucky Cowboy    Basic Metabolic Panel: Recent Labs  Lab 04/12/19 9622 04/13/19 0612 04/19/2019 0604 04/26/2019 0604 04/09/2019 2253 04/07/2019 2253 04/15/19 0804 04/16/19 1019 04/17/19 0521  NA 138   < > 139  --  139  --  139 139 140  K 5.7*   < > 5.4*  --  4.5  --  4.6 4.2 4.0  CL 100   < > 99  --  98  --  99 99 100  CO2 25   < > 26  --  30  --  29 29 31   GLUCOSE 127*   < > 94  --  114*  --  116* 115* 102*  BUN 73*   < > 80*  --  60*  --  59* 34* 24*  CREATININE 5.05*   < > 5.32*  --  4.08*  --  4.48* 2.74* 2.02*  CALCIUM 9.1   < > 9.5   < > 8.8*   < > 8.7* 8.5* 8.5*  PHOS 5.2*  --   --   --   --   --   --  3.2  --    < > = values in this interval not displayed.    Liver Function Tests: Recent Labs  Lab 04/25/2019 1613 04/16/19 1019  AST 26  --   ALT 26  --   ALKPHOS 110  --    BILITOT 0.8  --   PROT 7.3  --   ALBUMIN 4.3 3.0*   No results for input(s): LIPASE, AMYLASE in the last 168 hours. No results for input(s): AMMONIA in the last 168 hours.  CBC: Recent Labs  Lab 04/22/2019 1613 04/23/2019 1613 04/12/19 0643 04/13/19 0612 04/15/19 0804 04/16/19 1019 04/17/19 0521  WBC 7.9  --   --  6.6 8.0 8.4 9.7  NEUTROABS 6.3  --   --  5.3  --   --   --   HGB 12.0   < > 10.8* 11.7* 10.3* 9.7* 9.7*  HCT 36.7   < > 34.4* 36.0 32.2* 29.7* 29.4*  MCV 91.3  --   --  90.7 95.0 92.5 91.6  PLT 218  --   --  180 196 179 184   < > = values in this  interval not displayed.    Cardiac Enzymes: Recent Labs  Lab 05/03/2019 1613  CKTOTAL 146    BNP: Invalid input(s): POCBNP  CBG: Recent Labs  Lab 04/16/19 1258 04/16/19 1739 04/16/19 2110 04/17/19 0744 04/17/19 1156  GLUCAP 97 114* 102* 90 88    Microbiology: Results for orders placed or performed during the hospital encounter of 04/17/2019  SARS CORONAVIRUS 2 (TAT 6-24 HRS) Nasopharyngeal Nasopharyngeal Swab     Status: None   Collection Time: 04/16/2019  9:28 PM   Specimen: Nasopharyngeal Swab  Result Value Ref Range Status   SARS Coronavirus 2 NEGATIVE NEGATIVE Final    Comment: (NOTE) SARS-CoV-2 target nucleic acids are NOT DETECTED. The SARS-CoV-2 RNA is generally detectable in upper and lower respiratory specimens during the acute phase of infection. Negative results do not preclude SARS-CoV-2 infection, do not rule out co-infections with other pathogens, and should not be used as the sole basis for treatment or other patient management decisions. Negative results must be combined with clinical observations, patient history, and epidemiological information. The expected result is Negative. Fact Sheet for Patients: SugarRoll.be Fact Sheet for Healthcare Providers: https://www.woods-mathews.com/ This test is not yet approved or cleared by the [place] FDA  and  has been authorized for detection and/or diagnosis of SARS-CoV-2 by FDA under an Emergency Use Authorization (EUA). This EUA will remain  in effect (meaning this test can be used) for the duration of the COVID-19 declaration under Section 56 4(b)(1) of the Act, 21 U.S.C. section 360bbb-3(b)(1), unless the authorization is terminated or revoked sooner. Performed at [facility], [place] [address], [place], [place] [address]   Urine Culture     Status: None   Collection Time: 04/13/19  9:11 PM   Specimen: Urine, Random  Result Value Ref Range Status   Specimen Description   Final    URINE, RANDOM Performed at [facility], [address], [place], [place] [address]    Special Requests   Final    NONE Performed at [facility], [address] [place], [place] [address]    Culture   Final    NO GROWTH Performed at [facility], [place] [address], [place], [place] [address]    Report Status 04/15/2019 FINAL  Final    Coagulation Studies: No results for input(s): LABPROT, INR in the last 72 hours.  Urinalysis: No results for input(s): COLORURINE, LABSPEC, PHURINE, GLUCOSEU, HGBUR, BILIRUBINUR, KETONESUR, PROTEINUR, UROBILINOGEN, NITRITE, LEUKOCYTESUR in the last 72 hours.  Invalid input(s): APPERANCEUR    Imaging: No results found.   Medications:   . sodium chloride     . allopurinol  100 mg Oral Daily  . aspirin  81 mg Oral Daily  . atorvastatin  20 mg Oral Daily  . Chlorhexidine Gluconate Cloth  6 each Topical Q0600  . cholecalciferol  2,000 Units Oral Daily  . epoetin (EPOGEN/PROCRIT) injection  4,000 Units Intravenous Q T,Th,Sa-HD  . ferrous sulfate  325 mg Oral BID  . furosemide  80 mg Oral BID  . heparin  5,000 Units Subcutaneous Q8H  . insulin aspart  0-15 Units Subcutaneous TID WC  . midodrine  5 mg Oral TID WC  . patiromer  16.8 g Oral Daily  . sodium chloride flush  3 mL Intravenous Q12H  . vitamin B-12  1,000 mcg Oral  Daily   sodium chloride, acetaminophen, ALPRAZolam, HYDROcodone-acetaminophen, HYDROmorphone (DILAUDID) injection, ondansetron (ZOFRAN) IV, ondansetron (ZOFRAN) IV, ondansetron **OR** [DISCONTINUED] ondansetron (ZOFRAN) IV, senna-docusate,  sodium chloride flush  Assessment/ Plan:  Michele Beck is a 79 y.o. black female with hypertension, diabetes mellitus type II, congestive heart failure, gout, hyperlipidemia, pacemaker, back surgery who is admitted to [facility] on 05/03/2019 for Hyperkalemia [E87.5] Weakness [O06.0] Chronic diastolic congestive heart failure ([place]) [I50.32] AKI (acute kidney injury) ([place]) [N17.9] Fall, initial encounter [W19.XXXA] Acute on chronic diastolic (congestive) heart failure ([place]) [I50.33]  #. 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  - Outpatient planning in progress Patient's primary Nephrologist, Dr. Lindalou Hose with [facility] [place], [place]) - continue furosemide to 80 mg PO BID  Next HD tentatively planned for Monday  # Diabetes mellitus type II with chronic kidney disease:  Lab Results  Component Value Date   HGBA1C 6.5 (H) 04/27/2019    #. Secondary Hyperparathyroidism: Lab Results  Component Value Date   PTH 107 (H) 04/12/2019   CALCIUM 8.5 (L) 04/17/2019   PHOS 3.2 04/16/2019  continue ergocalciferol supplements  #. Anemia with chronic kidney disease:  Lab Results  Component Value Date   HGB 9.7 (L) 04/17/2019  EPO with HD    LOS: 7 Zo [place] 4/11/20212:07 PM

## 2019-04-17 NOTE — Evaluation (Signed)
Physical Therapy Evaluation Patient Details Name: Michele Beck MRN: [id number] DOB: 18-Jun-1940 Today's Date: 04/17/2019   History of Present Illness  Michele Beck is an 79 y.o. female with PMH significant for CKD 4, diastolic CHF (admitted in Jan 2021 for decompensation), pulmonary HTN, DM 2, HTN, gout and obesity.  Patient has been feeling weak for some time, seen in the emergency room 04/02/2019 with concern for worsening lower extremity edema, referred to the heart failure clinic where she was treated with IV Lasix.  Presented to the ED on 4/4 following a fall at home while trying to get up from her recliner.  Clinical Impression  Pt is a pleasant 79 year old F who was admitted for above diagnoses. Pt performs bed mobility with supervision, transfers with min A/CGA, and ambulation with min A. Pt encountered supine in bed; able to perform stand pivot transfer from bedside to chair with min A initially for lift off and assistance with RW management, performs STSx3 with min A initially and CGA requiring decreased assistance with subsequent attempts. Pt ambulates 9' with RW and min A for balance requiring cuing for increased R step length, and cuing and assistance with safe RW management to maintain close proximity and particularly with turning. Pt limited in further ambulation distance secondary to reports of mild LE weakness. HR is elevated at rest (94 bpm) and increasing to 111 following gait trial; 3L O2 donned with all mobility tasks. Pt demos L side weakness, with LUE affected more than LLE, though sensation intact to all extremities and pt reports LUE weakness is not new (see objective for more detail).  Pt demonstrates deficits with strength, range of motion, balance and activity tolerance requiring continued skilled PT intervention.     Follow Up Recommendations Home health PT    Equipment Recommendations  Rolling walker with 5" wheels    Recommendations for Other Services        Precautions / Restrictions Precautions Precautions: Fall Restrictions Weight Bearing Restrictions: No      Mobility  Bed Mobility Overal bed mobility: Modified Independent             General bed mobility comments: requiring extra time with supine>sit  Transfers Overall transfer level: Needs assistance Equipment used: Rolling walker (2 wheeled) Transfers: Sit to/from [facility] Sit to Stand: Min assist;Min guard Stand pivot transfers: Min guard       General transfer comment: requiring min A with initial attempt for liftoff and balance upon standing; with subsequent attempts and further cuing in strategies (momentum rocking, foot placement, etc.) requiring less assistance (CGA). Cuing and manual assistance during stand pivot for foot placement and safe RW management.  Ambulation/Gait Ambulation/Gait assistance: Min assist Gait Distance (Feet): 50 Feet Assistive device: Rolling walker (2 wheeled) Gait Pattern/deviations: Decreased step length - right;Wide base of support;Step-to pattern     General Gait Details: Frequent cuing and assistance with RW management to maintain proximity, particularly with turning (would benefit from wider-base RW/bariatric). Requiring cuing for step-to/thru to increase R step length as R leg tends to lag behind. Decreased R knee flexion during swing.  Stairs            Wheelchair Mobility    Modified Rankin (Stroke Patients Only)       Balance Overall balance assessment: Needs assistance Sitting-balance support: Feet supported;Bilateral upper extremity supported Sitting balance-Leahy Scale: Fair Sitting balance - Comments: Able to sit steady edge of bed with UE support   Standing balance support: No upper  extremity supported;During functional activity Standing balance-Leahy Scale: Fair Standing balance comment: Able to stand without UE support and perform lateral weight-shifting                              Pertinent Vitals/Pain Pain Assessment: No/denies pain(with exception of mild chest pain with coughing)    Home Living Family/patient expects to be discharged to:: Private residence Living Arrangements: Alone Available Help at Discharge: Friend(s) Type of Home: House Home Access: Stairs to enter Entrance Stairs-Rails: None Entrance Stairs-Number of Steps: 3   Home Equipment: Bedside commode;Shower seat;Cane - single point      Prior Function Level of Independence: Independent with assistive device(s)         Comments: Pt reports ind with ADLs, household and community ambulation with occasional SPC use     Hand Dominance        Extremity/Trunk Assessment   Upper Extremity Assessment Upper Extremity Assessment: RUE deficits/detail;LUE deficits/detail RUE Deficits / Details: RUE grossly within normal limits (full active ROM) LUE Deficits / Details: LUE shoulder 3-/5 (approx 50% available AROM); pain-free L shoulder PROM; though pt reports this is not new onset, citing "occasional stiffness"    Lower Extremity Assessment Lower Extremity Assessment: RLE deficits/detail;LLE deficits/detail RLE Deficits / Details: R knee grossly 4-/5, sensation intact RLE Sensation: WNL LLE Deficits / Details: L knee grossly 4/5 LLE Sensation: WNL    Cervical / Trunk Assessment Cervical / Trunk Assessment: Normal  Communication   Communication: No difficulties  Cognition Arousal/Alertness: Awake/alert Behavior During Therapy: WFL for tasks assessed/performed Overall Cognitive Status: Within Functional Limits for tasks assessed                                        General Comments      Exercises Total Joint Exercises Ankle Circles/Pumps: AROM;20 reps Marching in Standing: AROM;10 reps   Assessment/Plan    PT Assessment Patient needs continued PT services  PT Problem List Decreased strength;Decreased coordination;Decreased range of motion;Decreased  activity tolerance;Decreased balance;Decreased mobility;Decreased safety awareness       PT Treatment Interventions DME instruction;Therapeutic exercise;Gait training;Stair training;Neuromuscular re-education;Functional mobility training;Therapeutic activities;Patient/family education    PT Goals (Current goals can be found in the Care Plan section)  Acute Rehab PT Goals Patient Stated Goal: to go home PT Goal Formulation: With patient Time For Goal Achievement: 05/01/19 Potential to Achieve Goals: Good    Frequency Min 2X/week   Barriers to discharge        Co-evaluation               AM-PAC PT "6 Clicks" Mobility  Outcome Measure Help needed turning from your back to your side while in a flat bed without using bedrails?: A Little Help needed moving from lying on your back to sitting on the side of a flat bed without using bedrails?: A Little Help needed moving to and from a bed to a chair (including a wheelchair)?: A Little Help needed standing up from a chair using your arms (e.g., wheelchair or bedside chair)?: A Little Help needed to walk in hospital room?: A Little Help needed climbing 3-5 steps with a railing? : A Lot 6 Click Score: 17    End of Session Equipment Utilized During Treatment: Gait belt;Oxygen Activity Tolerance: Patient tolerated treatment well Patient left: in chair;with call bell/phone within reach;with  chair alarm set   PT Visit Diagnosis: Unsteadiness on feet (R26.81);Other abnormalities of gait and mobility (R26.89);Repeated falls (R29.6);Muscle weakness (generalized) (M62.81)    Time: 2111-7356 PT Time Calculation (min) (ACUTE ONLY): 42 min   Charges:   PT Evaluation $PT Eval Moderate Complexity: 1 Mod PT Treatments $Gait Training: 8-22 mins        Petra Kuba, PT, DPT 04/17/19, 9:37 AM

## 2019-04-17 NOTE — Progress Notes (Signed)
PROGRESS NOTE    Michele Beck   VZD:[id number]  DOB: Dec 11, 1940  PCP: System, Pcp Not In    DOA: 04/17/2019 LOS: 7   Brief Narrative   Michele Beck is an 79 y.o. femalewith past medical history significant forCKD 4, diastolic CHF (admitted in Jan 2021 for decompensation), pulmonary HTN, DM 2, HTN, gout and obesity. Patient has been feeling weak for some time, seen in the emergency room 04/02/2019 with concern for worsening lower extremity edema, referred to the heart failure clinic where she was treated with IV Lasix.  Now presented to the ED on 4/4 following a fall at home while trying to get up from her recliner.  In the ED, UA concerning for UTI, creatinine above baseline CKD.  Admitted to hospitalist service for further evaluation and management.   Assessment & Plan   Principal Problem:   ESRD (end stage renal disease) ([place]) Active Problems:   Type 2 diabetes mellitus with stage 4 chronic kidney disease (HCC)   Essential hypertension   Pulmonary HTN (HCC)   Acute on chronic diastolic heart failure (HCC)   AKI (acute kidney injury) ([place])   UTI (urinary tract infection)   Generalized weakness   Fall at home, initial encounter   Obesity, Class III, BMI 40-49.9 (morbid obesity) ([place])   Acute on chronic diastolic (congestive) heart failure (HCC)   Hyperkalemia   Sinus bradycardia  ESRD - now on dialysis, initiated on 4/8, 2nd session 4/9.  Permcath placed and dialysis initiated on 4/8. Hyperkalemia - due to above.  Resolved. --Nephrology following --Dialysis here, outpatient being arranged  --Continue V[place]assa --Avoid nephrotoxins and hypotension. No ACE inhibitor/ARB. --Lasix per nephrology  UTI (urinary tract infection) -presented with generalized weakness status post fall.  Urinalysis concerning for UTI with moderate leukocytes, many bacteria and greater than 50 WBCs.  Urine culture returned negative.  Stop antibiotics and monitor.   Acute on chronic diastolic  heart failure  Severe pulmonary hypertension Echocardiogram showed EF 55 to 29%, grade 1 diastolic dysfunction, severe tricuspid regurgitation --Continue IV Lasix if BP allows --Continue Midodrine  --daily weights, strict I/O's  Sinus bradycardia - asymptomatic.   --Hold metoprolol. --Telemetry monitoring  Generalized weakness, right knee arthritis Fall at home, subsequent encounter besity, Class III, BMI 40-49.9 (morbid obesity)  Morbid obesity a complicating factor PT and OT evaluations are pending due to hyperkalemia and renal failure  Type 2 diabetes mellitus  Insulin sliding scale as needed  Essential hypertension -Holding amlodipine and hydralazine due to soft blood pressures  Patient BMI: Body mass index is 41.27 kg/m.   DVT prophylaxis: Heparin Diet:  Diet Orders (From admission, onward)    Start     Ordered   04/25/2019 1115  Diet renal/carb modified with fluid restriction Diet-HS Snack? Nothing; Fluid restriction: 1200 mL Fluid; Room service appropriate? Yes; Fluid consistency: Thin  Diet effective now    Question Answer Comment  Diet-HS Snack? Nothing   Fluid restriction: 1200 mL Fluid   Room service appropriate? Yes   Fluid consistency: Thin      04/13/2019 1114            Code Status: Full Code    Subjective 04/17/19    Patient seen up in chair in her room this AM.  No acute events reported overnight.  She reports feeling pretty good.  Denies fever, but is a bit cold, wants another blanket.  Denies SOB, CP, N/V/D or other acute complaints.   Disposition Plan & Communication  Dispo & Barriers: Pending arrangement of outpatient dialysis and clearance by nephrology.  Patient newly started on dialysis and needs to remain in hospital for this until outpatient chair ready for her. Coming from: Home Exp d/c date: TBD Medically stable for d/c?  No  Family Communication: None at bedside during encounter, will attempt to call   Consults, Procedures,  Significant Events   Consultants:   Nephrology  Procedures:   None  Antimicrobials:   None   Objective   Vitals:   04/16/19 1630 04/16/19 1928 04/17/19 0409 04/17/19 0742  BP: 133/63 (!) 154/57 131/88 (!) 144/62  Pulse: 93 85 97 (!) 101  Resp:  16 18 17   Temp: (!) 97.5 F (36.4 C) 97.8 F (36.6 C) 98.1 F (36.7 C) (!) 97.3 F (36.3 C)  TempSrc: Oral Oral Oral   SpO2: 96% 99% 99% 98%  Weight:   105.7 kg   Height:        Intake/Output Summary (Last 24 hours) at 04/17/2019 9622 Last data filed at 04/17/2019 0100 Gross per 24 hour  Intake 590 ml  Output 1217 ml  Net -627 ml   Filed Weights   04/20/2019 0857 04/16/19 0543 04/17/19 0409  Weight: 107.7 kg 98.8 kg 105.7 kg    Physical Exam:  General exam: awake, no acute distress, obese Respiratory system: Diminished at the bases, no wheezes or rhonchi, normal respiratory effort. Cardiovascular system: normal S1/S2, RRR, bilateral lower extremity 3+ pitting edema, right upper chest with permcath in place. Central nervous system: A&O x3. no gross focal neurologic deficits, normal speech Extremities: moves all, no cyanosis, normal tone Psychiatry: normal mood, congruent affect  Labs   Data Reviewed: I have personally reviewed following labs and imaging studies  CBC: Recent Labs  Lab 05/05/2019 1613 04/22/2019 1613 04/12/19 0643 04/13/19 0612 04/15/19 0804 04/16/19 1019 04/17/19 0521  WBC 7.9  --   --  6.6 8.0 8.4 9.7  NEUTROABS 6.3  --   --  5.3  --   --   --   HGB 12.0   < > 10.8* 11.7* 10.3* 9.7* 9.7*  HCT 36.7   < > 34.4* 36.0 32.2* 29.7* 29.4*  MCV 91.3  --   --  90.7 95.0 92.5 91.6  PLT 218  --   --  180 196 179 184   < > = values in this interval not displayed.   Basic Metabolic Panel: Recent Labs  Lab 04/12/19 0643 04/13/19 0612 05/02/2019 0604 05/02/2019 2253 04/15/19 0804 04/16/19 1019 04/17/19 0521  NA 138   < > 139 139 139 139 140  K 5.7*   < > 5.4* 4.5 4.6 4.2 4.0  CL 100   < > 99 98 99 99  100  CO2 25   < > 26 30 29 29 31   GLUCOSE 127*   < > 94 114* 116* 115* 102*  BUN 73*   < > 80* 60* 59* 34* 24*  CREATININE 5.05*   < > 5.32* 4.08* 4.48* 2.74* 2.02*  CALCIUM 9.1   < > 9.5 8.8* 8.7* 8.5* 8.5*  PHOS 5.2*  --   --   --   --  3.2  --    < > = values in this interval not displayed.   GFR: Estimated Creatinine Clearance: 26.7 mL/min (A) (by C-G formula based on SCr of 2.02 mg/dL (H)). Liver Function Tests: Recent Labs  Lab 04/29/2019 1613 04/16/19 1019  AST 26  --   ALT 26  --  ALKPHOS 110  --   BILITOT 0.8  --   PROT 7.3  --   ALBUMIN 4.3 3.0*   No results for input(s): LIPASE, AMYLASE in the last 168 hours. No results for input(s): AMMONIA in the last 168 hours. Coagulation Profile: No results for input(s): INR, PROTIME in the last 168 hours. Cardiac Enzymes: Recent Labs  Lab 04/20/2019 1613  CKTOTAL 146   BNP (last 3 results) No results for input(s): PROBNP in the last 8760 hours. HbA1C: No results for input(s): HGBA1C in the last 72 hours. CBG: Recent Labs  Lab 04/16/19 0754 04/16/19 1258 04/16/19 1739 04/16/19 2110 04/17/19 0744  GLUCAP 98 97 114* 102* 90   Lipid Profile: No results for input(s): CHOL, HDL, LDLCALC, TRIG, CHOLHDL, LDLDIRECT in the last 72 hours. Thyroid Function Tests: No results for input(s): TSH, T4TOTAL, FREET4, T3FREE, THYROIDAB in the last 72 hours. Anemia Panel: No results for input(s): VITAMINB12, FOLATE, FERRITIN, TIBC, IRON, RETICCTPCT in the last 72 hours. Sepsis Labs: Recent Labs  Lab 05/03/2019 1613  LATICACIDVEN 1.3    Recent Results (from the past 240 hour(s))  SARS CORONAVIRUS 2 (TAT 6-24 HRS) Nasopharyngeal Nasopharyngeal Swab     Status: None   Collection Time: 04/09/2019  9:28 PM   Specimen: Nasopharyngeal Swab  Result Value Ref Range Status   SARS Coronavirus 2 NEGATIVE NEGATIVE Final    Comment: (NOTE) SARS-CoV-2 target nucleic acids are NOT DETECTED. The SARS-CoV-2 RNA is generally detectable in upper  and lower respiratory specimens during the acute phase of infection. Negative results do not preclude SARS-CoV-2 infection, do not rule out co-infections with other pathogens, and should not be used as the sole basis for treatment or other patient management decisions. Negative results must be combined with clinical observations, patient history, and epidemiological information. The expected result is Negative. Fact Sheet for Patients: SugarRoll.be Fact Sheet for Healthcare Providers: https://www.woods-mathews.com/ This test is not yet approved or cleared by the [place] FDA and  has been authorized for detection and/or diagnosis of SARS-CoV-2 by FDA under an Emergency Use Authorization (EUA). This EUA will remain  in effect (meaning this test can be used) for the duration of the COVID-19 declaration under Section 56 4(b)(1) of the Act, 21 U.S.C. section 360bbb-3(b)(1), unless the authorization is terminated or revoked sooner. Performed at [facility], [place] [address], [place], [place] [address]   Urine Culture     Status: None   Collection Time: 04/13/19  9:11 PM   Specimen: Urine, Random  Result Value Ref Range Status   Specimen Description   Final    URINE, RANDOM Performed at [facility], [address], [place], [place] [address]    Special Requests   Final    NONE Performed at [facility], [address] [place], [place] [address]    Culture   Final    NO GROWTH Performed at [facility], [place] [address], [place],  [address]    Report Status 04/15/2019 FINAL  Final      Imaging Studies   No results found.   Medications   Scheduled Meds: . allopurinol  100 mg Oral Daily  . aspirin  81 mg Oral Daily  . atorvastatin  20 mg Oral Daily  . Chlorhexidine Gluconate Cloth  6 each Topical Q0600  . cholecalciferol  2,000 Units Oral Daily  . epoetin (EPOGEN/PROCRIT) injection  4,000  Units Intravenous Q T,Th,Sa-HD  . ferrous sulfate  325 mg Oral  BID  . furosemide  80 mg Oral BID  . heparin  5,000 Units Subcutaneous Q8H  . insulin aspart  0-15 Units Subcutaneous TID WC  . midodrine  5 mg Oral TID WC  . patiromer  16.8 g Oral Daily  . sodium chloride flush  3 mL Intravenous Q12H  . vitamin B-12  1,000 mcg Oral Daily   Continuous Infusions: . sodium chloride         LOS: 7 days    Time spent: 25 minutes    Ezekiel Slocumb, DO [facility]   If 7PM-7AM, please contact night-coverage [email or web address] 04/17/2019, 8:22 AM

## 2019-04-18 LAB — CBC
HCT: 28.5 % — ABNORMAL LOW (ref 36.0–46.0)
Hemoglobin: 9 g/dL — ABNORMAL LOW (ref 12.0–15.0)
MCH: 29.9 pg (ref 26.0–34.0)
MCHC: 31.6 g/dL (ref 30.0–36.0)
MCV: 94.7 fL (ref 80.0–100.0)
Platelets: 170 10*3/uL (ref 150–400)
RBC: 3.01 MIL/uL — ABNORMAL LOW (ref 3.87–5.11)
RDW: 17.7 % — ABNORMAL HIGH (ref 11.5–15.5)
WBC: 8.9 10*3/uL (ref 4.0–10.5)
nRBC: 1.2 % — ABNORMAL HIGH (ref 0.0–0.2)

## 2019-04-18 LAB — BASIC METABOLIC PANEL
Anion gap: 10 (ref 5–15)
BUN: 36 mg/dL — ABNORMAL HIGH (ref 8–23)
CO2: 30 mmol/L (ref 22–32)
Calcium: 9 mg/dL (ref 8.9–10.3)
Chloride: 101 mmol/L (ref 98–111)
Creatinine, Ser: 2.27 mg/dL — ABNORMAL HIGH (ref 0.44–1.00)
GFR calc Af Amer: 23 mL/min — ABNORMAL LOW (ref >60)
GFR calc non Af Amer: 20 mL/min — ABNORMAL LOW (ref >60)
Glucose, Bld: 107 mg/dL — ABNORMAL HIGH (ref 70–99)
Potassium: 4 mmol/L (ref 3.5–5.1)
Sodium: 141 mmol/L (ref 135–145)

## 2019-04-18 LAB — GLUCOSE, CAPILLARY
Glucose-Capillary: 88 mg/dL (ref 70–99)
Glucose-Capillary: 130 mg/dL — ABNORMAL HIGH (ref 70–99)
Glucose-Capillary: 155 mg/dL — ABNORMAL HIGH (ref 70–99)

## 2019-04-18 MED ORDER — ALUM & MAG HYDROXIDE-SIMETH 200-200-20 MG/5ML PO SUSP: 30.0000 mL | Freq: Four times a day (QID) | ORAL | Status: DC | PRN

## 2019-04-18 NOTE — Care Management Important Message (Signed)
Important Message  Patient Details  Name: Michele Beck MRN: [id number] Date of Birth: 1940/03/10   Medicare Important Message Given:  Yes     Dannette Barbara 04/18/2019, 1:59 PM

## 2019-04-18 NOTE — Progress Notes (Signed)
PT Cancellation Note  Patient Details Name: Michele Beck MRN: [id number] DOB: 1940/02/11   Cancelled Treatment:    Reason Eval/Treat Not Completed: Other (comment)(Pt out of room (HD). PT to follow up as able.)   Lieutenant Diego PT, DPT 1:15 PM,04/18/19

## 2019-04-18 NOTE — Progress Notes (Signed)
PROGRESS NOTE    Michele Beck   VQQ:[id number]  DOB: [birth year]  PCP: System, Pcp Not In    DOA: 04/20/2019 LOS: 8   Brief Narrative   Michele Beck is an 79 y.o. femalewith past medical history significant forCKD 4, diastolic CHF (admitted in Jan 2021 for decompensation), pulmonary HTN, DM 2, HTN, gout and obesity. Patient has been feeling weak for some time, seen in the emergency room 04/02/2019 with concern for worsening lower extremity edema, referred to the heart failure clinic where she was treated with IV Lasix.  Now presented to the ED on 4/4 following a fall at home while trying to get up from her recliner.  In the ED, UA concerning for UTI, creatinine above baseline CKD.  Admitted to hospitalist service for further evaluation and management.   Assessment & Plan   Principal Problem:   ESRD (end stage renal disease) ([place]) Active Problems:   Type 2 diabetes mellitus with stage 4 chronic kidney disease (HCC)   Essential hypertension   Pulmonary HTN (HCC)   Acute on chronic diastolic heart failure (HCC)   AKI (acute kidney injury) ([place])   UTI (urinary tract infection)   Generalized weakness   Fall at home, initial encounter   Obesity, Class III, BMI 40-49.9 (morbid obesity) ([place])   Acute on chronic diastolic (congestive) heart failure (HCC)   Hyperkalemia   Sinus bradycardia  ESRD - now on dialysis, initiated on 4/8.   Permcath placed 4/8.  Outpatient dialysis will be Monday Wednesday Friday at 3:30 PM [facility] Hyperkalemia - due to above.  Resolved. --Nephrology following --Dialysis MWF while here --Continue [place] --Avoid nephrotoxins and hypotension. No ACE inhibitor/ARB. --Lasix per nephrology --Continue midodrine for BP support  UTI (urinary tract infection) -presented with generalized weakness status post fall.  Urinalysis concerning for UTI with moderate leukocytes, many bacteria and greater than 50 WBCs.  Urine culture returned  negative.  Stop antibiotics and monitor.  Patient remained asymptomatic and afebrile.  Acute on chronic diastolic heart failure  Severe pulmonary hypertension Echocardiogram showed EF 55 to 43%, grade 1 diastolic dysfunction, severe tricuspid regurgitation --Continue IV Lasix if BP allows --Continue Midodrine  --daily weights, strict I/O's  Sinus bradycardia - asymptomatic.   --Hold metoprolol. --Telemetry monitoring  Generalized weakness, right knee arthritis Fall at home, subsequent encounter besity, Class III, BMI 40-49.9 (morbid obesity)  Morbid obesity a complicating factor PT and OT evaluations are pending due to hyperkalemia and renal failure  Type 2 diabetes mellitus  Insulin sliding scale as needed  Essential hypertension -Holding amlodipine and hydralazine due to soft blood pressures  Patient BMI: Body mass index is 41.1 kg/m.   DVT prophylaxis: Heparin Diet:  Diet Orders (From admission, onward)    Start     Ordered   04/09/2019 1115  Diet renal/carb modified with fluid restriction Diet-HS Snack? Nothing; Fluid restriction: 1200 mL Fluid; Room service appropriate? Yes; Fluid consistency: Thin  Diet effective now    Question Answer Comment  Diet-HS Snack? Nothing   Fluid restriction: 1200 mL Fluid   Room service appropriate? Yes   Fluid consistency: Thin      04/12/2019 1114            Code Status: Full Code    Subjective 04/18/19    Patient seen during dialysis up in chair.  Resting but awakens.  Denies acute complaints including SOB, CP, N/V/D.  States son will be her ride home but he is working and not  sure he can pick her up today.   Disposition Plan & Communication   Dispo & Barriers: Discharge home with home health PT tomorrow as patient did not have transportation home to [place] today Coming from: Home Exp d/c date: TBD Medically stable for d/c?  No  Family Communication: None at bedside during encounter, will attempt to  call   Consults, Procedures, Significant Events   Consultants:   Nephrology  Procedures:   None  Antimicrobials:   None   Objective   Vitals:   04/18/19 1345 04/18/19 2000 04/18/19 2020 04/18/19 2045  BP: 120/61  (!) 132/48   Pulse: (!) 103  (!) 128   Resp: 13 19 20 19   Temp:   98.4 F (36.9 C)   TempSrc:   Oral   SpO2:   98%   Weight:      Height:        Intake/Output Summary (Last 24 hours) at 04/18/2019 2323 Last data filed at 04/18/2019 1800 Gross per 24 hour  Intake 717 ml  Output 2650 ml  Net -1933 ml   Filed Weights   04/16/19 0543 04/17/19 0409 04/18/19 0518  Weight: 98.8 kg 105.7 kg 105.2 kg    Physical Exam:  General exam: no acute distress, obese Respiratory system: Decreased breath sounds, normal respiratory effort. Cardiovascular system: normal S1/S2, RRR, bilateral lower extremity tense pitting edema, PermCath in place  Chest gastrointestinal: Soft nontender nondistended  Labs   Data Reviewed: I have personally reviewed following labs and imaging studies  CBC: Recent Labs  Lab 04/13/19 0612 04/15/19 0804 04/16/19 1019 04/17/19 0521 04/18/19 0412  WBC 6.6 8.0 8.4 9.7 8.9  NEUTROABS 5.3  --   --   --   --   HGB 11.7* 10.3* 9.7* 9.7* 9.0*  HCT 36.0 32.2* 29.7* 29.4* 28.5*  MCV 90.7 95.0 92.5 91.6 94.7  PLT 180 196 179 184 003   Basic Metabolic Panel: Recent Labs  Lab 04/12/19 0643 04/13/19 0612 04/17/2019 2253 04/15/19 0804 04/16/19 1019 04/17/19 0521 04/18/19 0412  NA 138   < > 139 139 139 140 141  K 5.7*   < > 4.5 4.6 4.2 4.0 4.0  CL 100   < > 98 99 99 100 101  CO2 25   < > 30 29 29 31 30   GLUCOSE 127*   < > 114* 116* 115* 102* 107*  BUN 73*   < > 60* 59* 34* 24* 36*  CREATININE 5.05*   < > 4.08* 4.48* 2.74* 2.02* 2.27*  CALCIUM 9.1   < > 8.8* 8.7* 8.5* 8.5* 9.0  PHOS 5.2*  --   --   --  3.2  --   --    < > = values in this interval not displayed.   GFR: Estimated Creatinine Clearance: 23.7 mL/min (A) (by C-G  formula based on SCr of 2.27 mg/dL (H)). Liver Function Tests: Recent Labs  Lab 04/16/19 1019  ALBUMIN 3.0*   No results for input(s): LIPASE, AMYLASE in the last 168 hours. No results for input(s): AMMONIA in the last 168 hours. Coagulation Profile: No results for input(s): INR, PROTIME in the last 168 hours. Cardiac Enzymes: No results for input(s): CKTOTAL, CKMB, CKMBINDEX, TROPONINI in the last 168 hours. BNP (last 3 results) No results for input(s): PROBNP in the last 8760 hours. HbA1C: No results for input(s): HGBA1C in the last 72 hours. CBG: Recent Labs  Lab 04/17/19 1703 04/17/19 2116 04/18/19 0804 04/18/19 1703 04/18/19 2118  GLUCAP 154* 127* 88 130* 155*   Lipid Profile: No results for input(s): CHOL, HDL, LDLCALC, TRIG, CHOLHDL, LDLDIRECT in the last 72 hours. Thyroid Function Tests: No results for input(s): TSH, T4TOTAL, FREET4, T3FREE, THYROIDAB in the last 72 hours. Anemia Panel: No results for input(s): VITAMINB12, FOLATE, FERRITIN, TIBC, IRON, RETICCTPCT in the last 72 hours. Sepsis Labs: No results for input(s): PROCALCITON, LATICACIDVEN in the last 168 hours.  Recent Results (from the past 240 hour(s))  SARS CORONAVIRUS 2 (TAT 6-24 HRS) Nasopharyngeal Nasopharyngeal Swab     Status: None   Collection Time: 04/08/2019  9:28 PM   Specimen: Nasopharyngeal Swab  Result Value Ref Range Status   SARS Coronavirus 2 NEGATIVE NEGATIVE Final    Comment: (NOTE) SARS-CoV-2 target nucleic acids are NOT DETECTED. The SARS-CoV-2 RNA is generally detectable in upper and lower respiratory specimens during the acute phase of infection. Negative results do not preclude SARS-CoV-2 infection, do not rule out co-infections with other pathogens, and should not be used as the sole basis for treatment or other patient management decisions. Negative results must be combined with clinical observations, patient history, and epidemiological information. The expected result is  Negative. Fact Sheet for Patients: SugarRoll.be Fact Sheet for Healthcare Providers: https://www.woods-mathews.com/ This test is not yet approved or cleared by the [place] FDA and  has been authorized for detection and/or diagnosis of SARS-CoV-2 by FDA under an Emergency Use Authorization (EUA). This EUA will remain  in effect (meaning this test can be used) for the duration of the COVID-19 declaration under Section 56 4(b)(1) of the Act, 21 U.S.C. section 360bbb-3(b)(1), unless the authorization is terminated or revoked sooner. Performed at [facility], [place] [address], [place], [place] [address]   Urine Culture     Status: None   Collection Time: 04/13/19  9:11 PM   Specimen: Urine, Random  Result Value Ref Range Status   Specimen Description   Final    URINE, RANDOM Performed at [facility], [address], [place], [place] [address]    Special Requests   Final    NONE Performed at [facility], [address], [place], [place] [address]    Culture   Final    NO GROWTH Performed at [facility], [place] [address], [place], [place] [address]    Report Status 04/15/2019 FINAL  Final      Imaging Studies   No results found.   Medications   Scheduled Meds: . allopurinol  100 mg Oral Daily  . aspirin  81 mg Oral Daily  . atorvastatin  20 mg Oral Daily  . Chlorhexidine Gluconate Cloth  6 each Topical Q0600  . cholecalciferol  2,000 Units Oral Daily  . epoetin (EPOGEN/PROCRIT) injection  4,000 Units Intravenous Q T,Th,Sa-HD  . feeding supplement (NEPRO CARB STEADY)  237 mL Oral BID BM  . ferrous sulfate  325 mg Oral BID  . furosemide  80 mg Oral BID  . heparin  5,000 Units Subcutaneous Q8H  . insulin aspart  0-15 Units Subcutaneous TID WC  . midodrine  5 mg Oral TID WC  . patiromer  16.8 g Oral Daily  . sodium chloride flush  3 mL Intravenous Q12H  . vitamin B-12  1,000 mcg Oral Daily    Continuous Infusions: . sodium chloride         LOS: 8 days    Time spent: 20 minutes    Ezekiel Slocumb, DO [facility]  If 7PM-7AM, please contact night-coverage [email or web address] 04/18/2019, 11:23 PM

## 2019-04-18 NOTE — Progress Notes (Signed)
This note also relates to the following rows which could not be included: Pulse Rate - Cannot attach notes to unvalidated device data Resp - Cannot attach notes to unvalidated device data BP - Cannot attach notes to unvalidated device data  Hd started  

## 2019-04-18 NOTE — Progress Notes (Signed)
[facility]  ROUNDING NOTE   Subjective:   Doing fair No acute c/o Able to eat without nausea or vomiting No shortness of breath + leg edema Seen during dialysis.  Tolerating well.  Undergoing dialysis while sitting in chair   HEMODIALYSIS FLOWSHEET:  Blood Flow Rate (mL/min): 400 mL/min Arterial Pressure (mmHg): -110 mmHg Venous Pressure (mmHg): 70 mmHg Transmembrane Pressure (mmHg): 50 mmHg Ultrafiltration Rate (mL/min): 70 mL/min Dialysate Flow Rate (mL/min): 800 ml/min Conductivity: Machine : 13.9 Conductivity: Machine : 13.9 Dialysis Fluid Bolus: Normal Saline Bolus Amount (mL): 200 mL      Objective:  Vital signs in last 24 hours:  Temp:  [98 F (36.7 C)-98.6 F (37 C)] 98.4 F (36.9 C) (04/12 1335) Pulse Rate:  [94-105] 103 (04/12 1345) Resp:  [13-29] 13 (04/12 1345) BP: (111-145)/(51-80) 120/61 (04/12 1345) SpO2:  [99 %-100 %] 99 % (04/12 0518) Weight:  [105.2 kg] 105.2 kg (04/12 0518)  Weight change: -0.454 kg Filed Weights   04/16/19 0543 04/17/19 0409 04/18/19 0518  Weight: 98.8 kg 105.7 kg 105.2 kg    Intake/Output: I/O last 3 completed shifts: In: 474 [P.O.:237; NG/GT:237] Out: 1400 [Urine:1400]   Intake/Output this shift:  Total I/O In: -  Out: 1800 [Other:1800]  Physical Exam: General: NAD,   Head: Normocephalic, atraumatic. Moist oral mucosal membranes  Eyes: Anicteric,   Neck: Supple,   Lungs:  [place] O2, normal effort  Heart: Irregular rhythm  Abdomen:  Soft, nontender,   Extremities:  + peripheral edema.  Neurologic: Nonfocal, moving all four extremities  Skin: No lesions  Access: RIJ permcath 4/8 Dr. Lucky Cowboy    Basic Metabolic Panel: Recent Labs  Lab 04/12/19 3382 04/13/19 0612 04/24/2019 2253 04/24/2019 2253 04/15/19 0804 04/15/19 0804 04/16/19 1019 04/17/19 0521 04/18/19 0412  NA 138   < > 139  --  139  --  139 140 141  K 5.7*   < > 4.5  --  4.6  --  4.2 4.0 4.0  CL 100   < > 98  --  99  --  99 100 101  CO2  25   < > 30  --  29  --  29 31 30   GLUCOSE 127*   < > 114*  --  116*  --  115* 102* 107*  BUN 73*   < > 60*  --  59*  --  34* 24* 36*  CREATININE 5.05*   < > 4.08*  --  4.48*  --  2.74* 2.02* 2.27*  CALCIUM 9.1   < > 8.8*   < > 8.7*   < > 8.5* 8.5* 9.0  PHOS 5.2*  --   --   --   --   --  3.2  --   --    < > = values in this interval not displayed.    Liver Function Tests: Recent Labs  Lab 04/16/19 1019  ALBUMIN 3.0*   No results for input(s): LIPASE, AMYLASE in the last 168 hours. No results for input(s): AMMONIA in the last 168 hours.  CBC: Recent Labs  Lab 04/13/19 0612 04/15/19 0804 04/16/19 1019 04/17/19 0521 04/18/19 0412  WBC 6.6 8.0 8.4 9.7 8.9  NEUTROABS 5.3  --   --   --   --   HGB 11.7* 10.3* 9.7* 9.7* 9.0*  HCT 36.0 32.2* 29.7* 29.4* 28.5*  MCV 90.7 95.0 92.5 91.6 94.7  PLT 180 196 179 184 170    Cardiac Enzymes: No results  for input(s): CKTOTAL, CKMB, CKMBINDEX, TROPONINI in the last 168 hours.  BNP: Invalid input(s): POCBNP  CBG: Recent Labs  Lab 04/17/19 0744 04/17/19 1156 04/17/19 1703 04/17/19 2116 04/18/19 0804  GLUCAP 90 88 154* 127* 88    Microbiology: Results for orders placed or performed during the hospital encounter of 04/08/2019  SARS CORONAVIRUS 2 (TAT 6-24 HRS) Nasopharyngeal Nasopharyngeal Swab     Status: None   Collection Time: 05/06/2019  9:28 PM   Specimen: Nasopharyngeal Swab  Result Value Ref Range Status   SARS Coronavirus 2 NEGATIVE NEGATIVE Final    Comment: (NOTE) SARS-CoV-2 target nucleic acids are NOT DETECTED. The SARS-CoV-2 RNA is generally detectable in upper and lower respiratory specimens during the acute phase of infection. Negative results do not preclude SARS-CoV-2 infection, do not rule out co-infections with other pathogens, and should not be used as the sole basis for treatment or other patient management decisions. Negative results must be combined with clinical observations, patient history, and  epidemiological information. The expected result is Negative. Fact Sheet for Patients: SugarRoll.be Fact Sheet for Healthcare Providers: https://www.woods-mathews.com/ This test is not yet approved or cleared by the [place] FDA and  has been authorized for detection and/or diagnosis of SARS-CoV-2 by FDA under an Emergency Use Authorization (EUA). This EUA will remain  in effect (meaning this test can be used) for the duration of the COVID-19 declaration under Section 56 4(b)(1) of the Act, 21 U.S.C. section 360bbb-3(b)(1), unless the authorization is terminated or revoked sooner. Performed at [facility], [place] [address], [place], [place] [address]   Urine Culture     Status: None   Collection Time: 04/13/19  9:11 PM   Specimen: Urine, Random  Result Value Ref Range Status   Specimen Description   Final    URINE, RANDOM Performed at [facility], [address] [place], [place] [address]    Special Requests   Final    NONE Performed at [facility], [address], [place], [place] [address]    Culture   Final    NO GROWTH Performed at [facility], [place] [address], [place], [place] [address]    Report Status 04/15/2019 FINAL  Final    Coagulation Studies: No results for input(s): LABPROT, INR in the last 72 hours.  Urinalysis: No results for input(s): COLORURINE, LABSPEC, PHURINE, GLUCOSEU, HGBUR, BILIRUBINUR, KETONESUR, PROTEINUR, UROBILINOGEN, NITRITE, LEUKOCYTESUR in the last 72 hours.  Invalid input(s): APPERANCEUR    Imaging: No results found.   Medications:   . sodium chloride     . allopurinol  100 mg Oral Daily  . aspirin  81 mg Oral Daily  . atorvastatin  20 mg Oral Daily  . Chlorhexidine Gluconate Cloth  6 each Topical Q0600  . cholecalciferol  2,000 Units Oral Daily  . epoetin (EPOGEN/PROCRIT) injection  4,000 Units Intravenous Q T,Th,Sa-HD  . feeding supplement (NEPRO  CARB STEADY)  237 mL Oral BID BM  . ferrous sulfate  325 mg Oral BID  . furosemide  80 mg Oral BID  . heparin  5,000 Units Subcutaneous Q8H  . insulin aspart  0-15 Units Subcutaneous TID WC  . midodrine  5 mg Oral TID WC  . patiromer  16.8 g Oral Daily  . sodium chloride flush  3 mL Intravenous Q12H  . vitamin B-12  1,000 mcg Oral Daily   sodium chloride, acetaminophen, ALPRAZolam, HYDROcodone-acetaminophen, HYDROmorphone (DILAUDID) injection, ondansetron (ZOFRAN) IV, ondansetron **OR** [DISCONTINUED] ondansetron (ZOFRAN)  IV, senna-docusate  Assessment/ Plan:  [name] is a 79 y.o. black female with hypertension, diabetes mellitus type II, congestive heart failure, gout, hyperlipidemia, pacemaker, back surgery who is admitted to [facility] on 04/15/2019 for Hyperkalemia [E87.5] Weakness [Y43.1] Chronic diastolic congestive heart failure ([place]) [I50.32] AKI (acute kidney injury) ([place]) [N17.9] Fall, initial encounter [W19.XXXA] Acute on chronic diastolic (congestive) heart failure ([place]) [I50.33]  #. 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  - Outpatient planning in progress for dialysis unit in [facility] Patient's primary Nephrologist, Dr. Lindalou Hose with [facility] [place], [place]) - continue furosemide at 80 mg PO BID HD schedule Monday Wednesday Friday Continue midodrine for blood pressure support   # Diabetes mellitus type II with chronic kidney disease:  Lab Results  Component Value Date   HGBA1C 6.5 (H) 04/30/2019    #. Secondary Hyperparathyroidism: Lab Results  Component Value Date   PTH 107 (H) 04/12/2019   CALCIUM 9.0 04/18/2019   PHOS 3.2 04/16/2019  continue ergocalciferol supplements  #. Anemia with chronic kidney disease:  Lab Results  Component Value Date   HGB 9.0 (L) 04/18/2019  EPO with HD    LOS: 8 Kaylor Maiers 4/12/20212:43 PM

## 2019-04-18 NOTE — Progress Notes (Signed)
Patient has been accepted to [facility] in [place]. Chair time is MWF 3:30pm. Patient can start Wednesday 4/14 at 3:00pm, if medically ready for discharge. Please let me know anticipated discharge date so coordination can be completed. Also please inform if patient will not be discharging home, so that other arrangements can be made if needed.

## 2019-04-18 NOTE — Progress Notes (Signed)
This note also relates to the following rows which could not be included: Pulse Rate - Cannot attach notes to unvalidated device data Resp - Cannot attach notes to unvalidated device data  Hd completed  

## 2019-04-19 ENCOUNTER — Inpatient Hospital Stay: Payer: Medicare Other

## 2019-04-19 DIAGNOSIS — D62 Acute posthemorrhagic anemia: Secondary | ICD-10-CM

## 2019-04-19 DIAGNOSIS — N186 End stage renal disease: Secondary | ICD-10-CM

## 2019-04-19 DIAGNOSIS — R571 Hypovolemic shock: Secondary | ICD-10-CM

## 2019-04-19 DIAGNOSIS — I4891 Unspecified atrial fibrillation: Secondary | ICD-10-CM

## 2019-04-19 LAB — CBC WITH DIFFERENTIAL/PLATELET
Abs Immature Granulocytes: 0.55 10*3/uL — ABNORMAL HIGH (ref 0.00–0.07)
Basophils Absolute: 0 10*3/uL (ref 0.0–0.1)
Basophils Relative: 0 %
Eosinophils Absolute: 0 10*3/uL (ref 0.0–0.5)
Eosinophils Relative: 0 %
HCT: 14.4 % — CL (ref 36.0–46.0)
Hemoglobin: 4.5 g/dL — CL (ref 12.0–15.0)
Immature Granulocytes: 4 %
Lymphocytes Relative: 9 %
Lymphs Abs: 1.3 10*3/uL (ref 0.7–4.0)
MCH: 30.4 pg (ref 26.0–34.0)
MCHC: 31.3 g/dL (ref 30.0–36.0)
MCV: 97.3 fL (ref 80.0–100.0)
Monocytes Absolute: 1.1 10*3/uL — ABNORMAL HIGH (ref 0.1–1.0)
Monocytes Relative: 7 %
Neutro Abs: 12.3 10*3/uL — ABNORMAL HIGH (ref 1.7–7.7)
Neutrophils Relative %: 80 %
Platelets: 111 10*3/uL — ABNORMAL LOW (ref 150–400)
RBC: 1.48 MIL/uL — ABNORMAL LOW (ref 3.87–5.11)
RDW: 18.2 % — ABNORMAL HIGH (ref 11.5–15.5)
Smear Review: NORMAL
WBC: 15.2 10*3/uL — ABNORMAL HIGH (ref 4.0–10.5)
nRBC: 2.4 % — ABNORMAL HIGH (ref 0.0–0.2)

## 2019-04-19 LAB — BLOOD GAS, ARTERIAL
Acid-Base Excess: 3.9 mmol/L — ABNORMAL HIGH (ref 0.0–2.0)
Bicarbonate: 30.2 mmol/L — ABNORMAL HIGH (ref 20.0–28.0)
FIO2: 0.28
O2 Saturation: 98.8 %
Patient temperature: 37
pCO2 arterial: 51 mmHg — ABNORMAL HIGH (ref 32.0–48.0)
pH, Arterial: 7.38 (ref 7.350–7.450)
pO2, Arterial: 127 mmHg — ABNORMAL HIGH (ref 83.0–108.0)

## 2019-04-19 LAB — PATHOLOGIST SMEAR REVIEW

## 2019-04-19 LAB — GLUCOSE, CAPILLARY
Glucose-Capillary: 162 mg/dL — ABNORMAL HIGH (ref 70–99)
Glucose-Capillary: 145 mg/dL — ABNORMAL HIGH (ref 70–99)
Glucose-Capillary: 136 mg/dL — ABNORMAL HIGH (ref 70–99)
Glucose-Capillary: 157 mg/dL — ABNORMAL HIGH (ref 70–99)

## 2019-04-19 LAB — COMPREHENSIVE METABOLIC PANEL
ALT: 14 U/L (ref 0–44)
AST: 22 U/L (ref 15–41)
Albumin: 2.7 g/dL — ABNORMAL LOW (ref 3.5–5.0)
Alkaline Phosphatase: 48 U/L (ref 38–126)
Anion gap: 11 (ref 5–15)
BUN: 48 mg/dL — ABNORMAL HIGH (ref 8–23)
CO2: 28 mmol/L (ref 22–32)
Calcium: 8.1 mg/dL — ABNORMAL LOW (ref 8.9–10.3)
Chloride: 102 mmol/L (ref 98–111)
Creatinine, Ser: 1.71 mg/dL — ABNORMAL HIGH (ref 0.44–1.00)
GFR calc Af Amer: 33 mL/min — ABNORMAL LOW (ref >60)
GFR calc non Af Amer: 28 mL/min — ABNORMAL LOW (ref >60)
Glucose, Bld: 167 mg/dL — ABNORMAL HIGH (ref 70–99)
Potassium: 4.5 mmol/L (ref 3.5–5.1)
Sodium: 141 mmol/L (ref 135–145)
Total Bilirubin: 0.9 mg/dL (ref 0.3–1.2)
Total Protein: 4.5 g/dL — ABNORMAL LOW (ref 6.5–8.1)

## 2019-04-19 LAB — HEMOGLOBIN AND HEMATOCRIT, BLOOD
HCT: 22.3 % — ABNORMAL LOW (ref 36.0–46.0)
Hemoglobin: 7.4 g/dL — ABNORMAL LOW (ref 12.0–15.0)

## 2019-04-19 LAB — MAGNESIUM: Magnesium: 1.6 mg/dL — ABNORMAL LOW (ref 1.7–2.4)

## 2019-04-19 LAB — PROCALCITONIN: Procalcitonin: 0.43 ng/mL

## 2019-04-19 LAB — APTT: aPTT: 36 seconds (ref 24–36)

## 2019-04-19 LAB — ABO/RH: ABO/RH(D): O POS

## 2019-04-19 LAB — PROTIME-INR
INR: 1.5 — ABNORMAL HIGH (ref 0.8–1.2)
Prothrombin Time: 17.9 seconds — ABNORMAL HIGH (ref 11.4–15.2)

## 2019-04-19 LAB — PREPARE RBC (CROSSMATCH)

## 2019-04-19 MED ORDER — SODIUM CHLORIDE 0.9% IV SOLUTION: Freq: Once | INTRAVENOUS | Status: AC

## 2019-04-19 MED ORDER — MAGNESIUM SULFATE 2 GM/50ML IV SOLN
2.0000 g | Freq: Once | INTRAVENOUS | Status: AC
  Administered 2019-04-19: 2 g via INTRAVENOUS
  Filled 2019-04-19: qty 50

## 2019-04-19 MED ORDER — PROMETHAZINE HCL 25 MG/ML IJ SOLN
12.5000 mg | Freq: Once | INTRAMUSCULAR | Status: AC
  Administered 2019-04-19: 12.5 mg via INTRAVENOUS
  Filled 2019-04-19: qty 1

## 2019-04-19 MED ORDER — HEPARIN (PORCINE) 25000 UT/250ML-% IV SOLN
1000.0000 [IU]/h | INTRAVENOUS | Status: DC
  Administered 2019-04-19: 1000 [IU]/h via INTRAVENOUS
  Filled 2019-04-19: qty 250

## 2019-04-19 MED ORDER — AMIODARONE HCL IN DEXTROSE 360-4.14 MG/200ML-% IV SOLN
60.0000 mg/h | INTRAVENOUS | Status: AC
  Administered 2019-04-19 (×2): 60 mg/h via INTRAVENOUS
  Filled 2019-04-19 (×2): qty 200

## 2019-04-19 MED ORDER — AMIODARONE HCL IN DEXTROSE 360-4.14 MG/200ML-% IV SOLN
30.0000 mg/h | INTRAVENOUS | Status: DC
  Administered 2019-04-19 – 2019-04-20 (×2): 30 mg/h via INTRAVENOUS
  Filled 2019-04-19 (×3): qty 200

## 2019-04-19 MED ORDER — SODIUM CHLORIDE 0.9 % IV BOLUS
250.0000 mL | Freq: Once | INTRAVENOUS | Status: AC
  Administered 2019-04-19: 03:00:00 250 mL via INTRAVENOUS

## 2019-04-19 MED ORDER — SODIUM CHLORIDE 0.9 % IV BOLUS
500.0000 mL | Freq: Once | INTRAVENOUS | Status: AC
  Administered 2019-04-19: 500 mL via INTRAVENOUS

## 2019-04-19 MED ORDER — SODIUM CHLORIDE 0.9 % IV SOLN
25.0000 ug/min | INTRAVENOUS | Status: DC
  Administered 2019-04-19: 40 ug/min via INTRAVENOUS
  Administered 2019-04-19: 25 ug/min via INTRAVENOUS
  Administered 2019-04-19: 20 ug/min via INTRAVENOUS
  Administered 2019-04-20: 35 ug/min via INTRAVENOUS
  Filled 2019-04-19 (×4): qty 10

## 2019-04-19 MED ORDER — ALBUMIN HUMAN 25 % IV SOLN
25.0000 g | Freq: Once | INTRAVENOUS | Status: AC
  Administered 2019-04-19: 01:00:00 25 g via INTRAVENOUS
  Filled 2019-04-19: qty 100

## 2019-04-19 MED ORDER — AMIODARONE LOAD VIA INFUSION
150.0000 mg | Freq: Once | INTRAVENOUS | Status: AC
  Administered 2019-04-19: 06:00:00 150 mg via INTRAVENOUS
  Filled 2019-04-19: qty 83.34

## 2019-04-19 MED ORDER — PANTOPRAZOLE SODIUM 40 MG IV SOLR
40.0000 mg | Freq: Two times a day (BID) | INTRAVENOUS | Status: DC
  Administered 2019-04-19 – 2019-04-20 (×2): 40 mg via INTRAVENOUS
  Filled 2019-04-19 (×2): qty 40

## 2019-04-19 MED ORDER — HEPARIN BOLUS VIA INFUSION
4000.0000 [IU] | Freq: Once | INTRAVENOUS | Status: AC
  Administered 2019-04-19: 4000 [IU] via INTRAVENOUS
  Filled 2019-04-19: qty 4000

## 2019-04-19 MED ORDER — SODIUM CHLORIDE 0.9 % IV SOLN
250.0000 mL | INTRAVENOUS | Status: DC
  Administered 2019-04-19: 250 mL via INTRAVENOUS

## 2019-04-19 NOTE — Procedures (Signed)
Central Venous Catheter Placement:TRIPLE LUMEN Indication:  Patient has limited or no vascular access.  Patient required blood transfusion and pressors.  Consent:emergent  Hand washing performed prior to starting the procedure.   Procedure:   An active timeout was performed and correct patient, name, & ID confirmed.   Patient was positioned correctly for central venous access.  Patient was prepped using strict sterile technique including chlorohexadine preps, sterile drape, sterile gown and sterile gloves.    The area was prepped, draped and anesthetized in the usual sterile manner. Patient comfort was obtained.    A triple lumen catheter was placed in the RIGHT FEMORAL vein there was good blood return, catheter caps were placed on lumens, catheter flushed easily, the line was secured and a sterile dressing and BIO-PATCH applied.   Ultrasound was used to visualize vasculature and guidance of needle.   Note: Line should be removed as soon as no longer needed.  Femoral position not ideal however, patient has PermCath on the right IJ this would make approach from the left IJ difficult as it would create a confluence of lines.  Number of Attempts: 1 Complications:none Estimated Blood Loss: Less than 5 mL Chest Radiograph not indicated Operator: Windy Canny. Derrill Kay, MD [facility]

## 2019-04-19 NOTE — Progress Notes (Signed)
ANTICOAGULATION CONSULT NOTE - Initial Consult  Pharmacy Consult for Heparin Indication: atrial fibrillation  No Known Allergies  Patient Measurements: Height: 5\' 3"  (160 cm) Weight: 103.1 kg (227 lb 4.7 oz) IBW/kg (Calculated) : 52.4 HEPARIN DW (KG): 78.2  Vital Signs: Temp: 98.4 F (36.9 C) (04/12 2020) Temp Source: Oral (04/12 2020) BP: 105/40 (04/13 0409) Pulse Rate: 116 (04/13 0409)  Labs: Recent Labs    04/16/19 1019 04/16/19 1019 04/17/19 0521 04/18/19 0412 04/19/19 0416  HGB 9.7*   < > 9.7* 9.0*  --   HCT 29.7*  --  29.4* 28.5*  --   PLT 179  --  184 170  --   CREATININE 2.74*   < > 2.02* 2.27* 1.71*   < > = values in this interval not displayed.    Estimated Creatinine Clearance: 31.1 mL/min (A) (by C-G formula based on SCr of 1.71 mg/dL (H)).  Medical History: Past Medical History:  Diagnosis Date  . Arthritis   . CHF (congestive heart failure) ([place])   . Diabetes mellitus without complication ([place])   . Hypertension   . Renal disorder     Medications:  Medications Prior to Admission  Medication Sig Dispense Refill Last Dose  . allopurinol (ZYLOPRIM) 100 MG tablet Take 100 mg by mouth daily.   04/24/2019 at 1000  . amLODipine (NORVASC) 5 MG tablet Take 5 mg by mouth daily.   04/19/2019 at 1000  . aspirin 81 MG chewable tablet Chew 81 mg by mouth daily.   04/13/2019 at 1000  . atorvastatin (LIPITOR) 20 MG tablet Take 20 mg by mouth daily.   04/18/2019 at 1000  . Cholecalciferol (VITAMIN D-3) 25 MCG (1000 UT) CAPS Take 2 capsules by mouth daily.   04/17/2019 at 1000  . ferrous sulfate 325 (65 FE) MG tablet Take 325 mg by mouth 2 (two) times daily.   04/22/2019 at 1000  . hydrALAZINE (APRESOLINE) 100 MG tablet Take 100 mg by mouth 3 (three) times daily.   04/18/2019 at 1000  . LEVEMIR FLEXTOUCH 100 UNIT/ML Pen SMARTSIG:6 Unit(s) SUB-Q Every Night   04/09/2019 at Unknown time  . metoprolol tartrate (LOPRESSOR) 100 MG tablet Take 100 mg by mouth 2 (two) times daily.    05/06/2019 at 1000  . torsemide (DEMADEX) 20 MG tablet Take 2 tablets (40 mg total) by mouth daily. (Patient taking differently: Take 40 mg by mouth 2 (two) times daily. ) 60 tablet 0 04/23/2019 at 1000  . vitamin B-12 (CYANOCOBALAMIN) 1000 MCG tablet Take 1,000 mcg by mouth daily.   04/18/2019 at 1000    Assessment: Patient development of sinus tach and border low BP's treated with albumin and low volume fluid bolus.  Ultimately developed Atrial fib with RVR - no response from dilt bolus, Amiodarone ordered [place] VASC 2 =3, Heparin drip ordered - SQ Heparin d/c'd, last dose charted 4/12 @ 2131 Baseline labs ordered, no anticoagulants PTA  Goal of Therapy:  Heparin level 0.3-0.7 units/ml Monitor platelets by anticoagulation protocol: Yes   Plan:  Heparin 4000 units bolus x 1 now then infusion at 1000 units/hr Check HL ~ 8 hours after heparin started  Hart Robinsons A 04/19/2019,6:40 AM

## 2019-04-19 NOTE — TOC Initial Note (Addendum)
Transition of Care [facility]) - Initial/Assessment Note    Patient Details  Name: Michele Beck MRN: [id number] Date of Birth: 1940/11/29  Transition of Care [facility]) CM/SW Contact:    Magnus Ivan, LCSW Phone Number: 04/19/2019, 2:20 PM  Clinical Narrative:            CSW attempted to meet with patient for Readmission Screen and to discuss PT recommendations for Home Health PT and a RW. Patient was lethargic and didn't engage in conversation. Per rounds this morning, patient was lethargic then as well.   CSW called and spoke with patient's daugther, Mechele Claude. Mechele Claude reported patient lives alone in [place], [place]. Mechele Claude reported patient was independent until this past December when she started having medical issues. Mechele Claude reported they plan for patient to return home where she lives alone. Patient was driving herself to appointments before this hospitalization. Mechele Claude said patient has a walker and 3 in 1 at home. She reported patient has never been to a SNF or used [facility] services. She reported they would like patient to be set up with [facility] services at discharge. Explained agency options. She reported she does not have a preference in agency. CSW will research agencies in that area and begin to reach out. Patient uses [facility] in [place], [place] for medications. Daughter denied issues with obtaining medications. Mechele Claude reported patient needs a ramp as both of the entrances to her home have steps. CSW discussed ramp resources as well as rental option if ramp is needed short term. Encouraged Mechele Claude to reach out with any questions or needs. CSW will continue to follow.    Expected Discharge Plan: Home/Self Care Barriers to Discharge: Continued Medical Work up   Patient Goals and CMS Choice   CMS Medicare.gov Compare Post Acute Care list provided to:: Patient Represenative (must comment) Choice offered to / list presented to : Adult Children  Expected Discharge Plan and Services Expected Discharge Plan:  Home/Self Care     Post Acute Care Choice: [place] arrangements for the past 2 months: [place]                                      Prior Living Arrangements/Services Living arrangements for the past 2 months: Single Family Home Lives with:: Self          Need for Family Participation in Patient Care: Yes (Comment) Care giver support system in place?: Yes (comment) Current home services: DME Criminal Activity/Legal Involvement Pertinent to Current Situation/Hospitalization: No - Comment as needed  Activities of Daily Living Home Assistive Devices/Equipment: Cane (specify quad or straight), Walker (specify type), CBG Meter, Shower chair with back(4 wheel walker) ADL Screening (condition at time of admission) Patient's cognitive ability adequate to safely complete daily activities?: Yes Is the patient deaf or have difficulty hearing?: No Does the patient have difficulty seeing, even when wearing glasses/contacts?: No Does the patient have difficulty concentrating, remembering, or making decisions?: No Patient able to express need for assistance with ADLs?: Yes Does the patient have difficulty dressing or bathing?: No Independently performs ADLs?: Yes (appropriate for developmental age) Does the patient have difficulty walking or climbing stairs?: Yes Weakness of Legs: Both Weakness of Arms/Hands: None  Permission Sought/Granted Permission sought to share information with : Investment banker, corporate granted to share info w AGENCY: [place]  Emotional Assessment   Attitude/Demeanor/Rapport: (Lethargic) Affect (typically observed): Unable to Assess Orientation: : (Unable to assess at this time) Alcohol / Substance Use: Not Applicable Psych Involvement: No (comment)  Admission diagnosis:  Hyperkalemia [E87.5] Weakness [M76.8] Chronic diastolic congestive heart failure (HCC) [I50.32] AKI (acute kidney  injury) ([place]) [N17.9] Fall, initial encounter [W19.XXXA] Acute on chronic diastolic (congestive) heart failure ([place]) [I50.33] Patient Active Problem List   Diagnosis Date Noted  . ESRD (end stage renal disease) ([place]) 04/15/2019  . UTI (urinary tract infection) 04/30/2019  . Generalized weakness 04/29/2019  . Fall at home, initial encounter 04/30/2019  . Obesity, Class III, BMI 40-49.9 (morbid obesity) ([place]) 04/25/2019  . Acute on chronic diastolic (congestive) heart failure ([place]) 05/04/2019  . Hyperkalemia 04/24/2019  . Sinus bradycardia 05/04/2019  . Peripheral edema   . Pulmonary HTN ([place])   . Acute on chronic diastolic heart failure ([place])   . AKI (acute kidney injury) ([place])   . Acute CHF (congestive heart failure) ([place]) 01/22/2019  . Essential hypertension   . Chronic renal impairment   . Type 2 diabetes mellitus with stage 4 chronic kidney disease ([place])   . Acute on chronic congestive heart failure ([place]) 01/02/2019   PCP:  System, Pcp Not In Pharmacy:   [id number] [place], [address] [place] [address] Phone: [phone] Fax: [phone]  [id number] [place], [place] - [id number] [place] [address] Phone: [phone] Fax: [phone]     Social Determinants of Health (SDOH) Interventions    Readmission Risk Interventions Readmission Risk Prevention Plan 04/19/2019  Transportation Screening Complete  Medication Review ([place]) Complete  PCP or Specialist appointment within 3-5 days of discharge Complete  HRI or [place] Complete

## 2019-04-19 NOTE — Progress Notes (Signed)
Patient reports that she did not have the best day, she has felt tired and lousy all day. With much difficulty her AM labs finally resulted around 1000 with significant drop in Hbg. Immediately discontinued heparin gtt, and attempted to get Type and Cross, which took a few hours due to difficult stick due to her edema. Her peripheral IVs also struggled with the continuous infusions with multiple interruptions throughout the day. Dr. Patsey Berthold was able to put in a right femoral central line that immediately helped BP stabilize out as phenylephrine was able to run smoothly. Blood consent signed. Finally got first unit of blood up about 1600. Second unit infusing. Will recheck Hgb after completed. Went to CT scan and back without any events. Son updated at bedside.

## 2019-04-19 NOTE — Progress Notes (Addendum)
OVERNIGHT Patient development of sinus tach and border low BP's treated with albumin and low volume fluid bolus.  Ultimately developed Atrial fib with RVR - no response from dilt bolus Amiodarone ordered [place] VASC 2 =3 Heparin drip ordered   patient again with hypotension, 1 liter of fluid ordered. Nursing starting amiodarone drip.  CCM consulted in case of continued hypotension Patient transfer to ICU ordered

## 2019-04-19 NOTE — Consult Note (Signed)
Name: Michele Beck MRN: [id number] DOB: [birth year]    ADMISSION DATE:  04/15/2019 CONSULTATION DATE: 04/19/2019  REFERRING MD : Sharion Settler, NP   CHIEF COMPLAINT: Hypotension   BRIEF PATIENT DESCRIPTION:  79 yo female admitted with acute on chronic diastolic CHF exacerbation, acute on chronic renal failure, and acute encephalopathy secondary to uremia requiring transfer to the stepdown unit with atrial fibrillation with rvr and hypotension requiring neo-synephrine and amiodarone gtts   SIGNIFICANT EVENTS/STUDIES:  04/4: Pt admitted to the telemetry unit with acute on chronic diastolic CHF exacerbation and acute on chronic renal failure  04/5: Nephrology consulted due to worsening renal failure iv lasix initiated 04/8: Vascular Surgery consulted for permcath placement due to worsening renal failure 04/8: Pt underwent first HD session  04/13: Pt developed atrial fibrillation with rvr and hypotension requiring transfer to ICU due to requirement of neo-synephrine, heparin, and amiodarone gtts.  Pt also with acute encephalopathy. PCCM team consulted to assist with management   HISTORY OF PRESENT ILLNESS:   This is a 79 yo female with a PMH of Stage IV CKD, Diastolic CHF, Pulmonary HTN, Type II Diabetes Mellitus, Gout, HTN, and Obesity.  She presented to [facility] ER on 04/4 following a fall at home.  Upon arrival to the ER pt c/o weakness and worsening bilateral lower extremity edema, onset of symptoms 2 weeks prior to ER presentation.  She was previously referred to the heart failure clinic and according to H&P she received 3 IV infusions of lasix the week prior to ER presentation.  In the ER lab results revealed K+ 5.7, glucose 120, BUN 68, creatinine 4.85, BNP 1,531, CK 146, troponin 37, lactic acid 1.3, COVID-19 negative, and CXR concerning for pulmonary vascular congestion. CT Head negative and X-ray of lumbar spine/pelvis/femur negative.  She was subsequently admitted to the telemetry unit  per hospitalist team for management of sinus bradycardia, acute on chronic diastolic CHF exacerbation, and acute on chronic renal failure with hyperkalemia.  Detailed hospital course outlined above under significant events.     PAST MEDICAL HISTORY :   has a past medical history of Arthritis, CHF (congestive heart failure) ([place]), Diabetes mellitus without complication ([place]), Hypertension, and Renal disorder.  has a past surgical history that includes Back surgery; Tubal ligation; pacemaker placement; and DIALYSIS/PERMA CATHETER INSERTION (N/A, 04/20/2019). Prior to Admission medications   Medication Sig Start Date End Date Taking? Authorizing Provider  allopurinol (ZYLOPRIM) 100 MG tablet Take 100 mg by mouth daily. 12/07/18  Yes [provider]  amLODipine (NORVASC) 5 MG tablet Take 5 mg by mouth daily. 11/02/18  Yes [provider]  aspirin 81 MG chewable tablet Chew 81 mg by mouth daily.   Yes [provider]  atorvastatin (LIPITOR) 20 MG tablet Take 20 mg by mouth daily. 12/27/18  Yes [provider]  Cholecalciferol (VITAMIN D-3) 25 MCG (1000 UT) CAPS Take 2 capsules by mouth daily.   Yes [provider]  ferrous sulfate 325 (65 FE) MG tablet Take 325 mg by mouth 2 (two) times daily. 10/04/18  Yes [provider]  hydrALAZINE (APRESOLINE) 100 MG tablet Take 100 mg by mouth 3 (three) times daily. 10/04/18  Yes [provider]  LEVEMIR FLEXTOUCH 100 UNIT/ML Pen SMARTSIG:6 Unit(s) SUB-Q Every Night 07/20/18  Yes [provider]  metoprolol tartrate (LOPRESSOR) 100 MG tablet Take 100 mg by mouth 2 (two) times daily. 12/27/18  Yes [provider]  torsemide (DEMADEX) 20 MG tablet Take 2 tablets (40 mg  total) by mouth daily. Patient taking differently: Take 40 mg by mouth 2 (two) times daily.  01/23/19  Yes Thornell Mule, MD  vitamin B-12 (CYANOCOBALAMIN) 1000 MCG tablet Take 1,000 mcg by mouth daily.   Yes [provider]   No Known Allergies  FAMILY HISTORY:  family history is not on file. SOCIAL HISTORY:  reports that she has never smoked. She has never used smokeless tobacco. She reports that she does not drink alcohol or use drugs.  REVIEW OF SYSTEMS:   Unable to assess pt confused   SUBJECTIVE:  Unable to assess pt confused   VITAL SIGNS: Temp:  [98 F (36.7 C)-98.4 F (36.9 C)] 98.4 F (36.9 C) (04/12 2020) Pulse Rate:  [94-128] 116 (04/13 0409) Resp:  [13-30] 20 (04/13 0605) BP: (96-145)/(40-84) 105/40 (04/13 0409) SpO2:  [90 %-100 %] 90 % (04/13 0409) Weight:  [103.1 kg] 103.1 kg (04/13 0500)  PHYSICAL EXAMINATION: General: chronically ill appearing female, resting in bed NAD Neuro: awake, not following commands, PERRL HEENT: supple, no JVD  Cardiovascular: sinus tach, no R/G, right chest permcath dressing clean and intact   Lungs: diminished throughout, even, non labored  Abdomen: +BS x4, obese, soft, non distended  Musculoskeletal: 2+ bilateral lower extremity edema  Skin: intact no rashes or lesions present   Recent Labs  Lab 04/17/19 0521 04/18/19 0412 04/19/19 0416  NA 140 141 141  K 4.0 4.0 4.5  CL 100 101 102  CO2 31 30 28   BUN 24* 36* 48*  CREATININE 2.02* 2.27* 1.71*  GLUCOSE 102* 107* 167*   Recent Labs  Lab 04/16/19 1019 04/17/19 0521 04/18/19 0412  HGB 9.7* 9.7* 9.0*  HCT 29.7* 29.4* 28.5*  WBC 8.4 9.7 8.9  PLT 179 184 170   No results found.  ASSESSMENT / PLAN:  Acute on chronic diastolic CHF exacerbation  Hypotension likely secondary to diuretic therapy  Atrial fibrillation with rvr  Pulmonary HTN  Hx: HTN Continuous telemetry monitoring  Hold outpatient antihypertensive medications for now  Will discontinue lasix for now; if diuresis needed consider diamox instead of lasix due Continue aspirin and atorvastatin Continue midodrine and prn peripheral neo-synephrine gtt to maintain map >65 Continue amiodarone and heparin gtts  for now   Acute on chronic renal failure with hyperkalemia-improving  Trend BMP  Replace electrolytes as indicated  Monitor UOP Avoid nephrotoxic medications Nephrology consulted appreciate input-dialysis per recommendations   Type II Diabetes Mellitus  CBG's ac/hs  SSI   Acute encephalopathy secondary to uremia  Frequent reorientation  Avoid sedating medications for now  Best Practice: VTE px: heparin gtt  SUP px: not indicated  Diet: Renal/carb modified with fluid restriction once mentation improves   Marda Stalker, [place] Pager [phone] (please enter 7 digits) PCCM Consult Pager [phone] (please enter 7 digits)

## 2019-04-19 NOTE — Consult Note (Signed)
Cephas Darby, MD [address]  [place]  [place], [place] [address]  Main: [phone]  Fax: [phone] Pager: [phone]   Consultation  Referring Provider:     No ref. provider found Primary Care Physician:  System, Pcp Not In Primary Gastroenterologist: Unassigned      Reason for Consultation:     Acute anemia  Date of Admission:  04/20/2019 Date of Consultation:  04/19/2019         HPI:   Michele Beck is a 79 y.o. female with history of stage IV CKD, diastolic heart failure, pulmonary hypertension, diabetes who is initially admitted on 4/4 after a fall.  She is being treated for acute on chronic CHF exacerbation, acute on chronic renal failure.  Patient is initiated on hemodialysis on 4/8 due to worsening kidney function.  Patient also had sinus bradycardia upon admission.  Last night, patient became hypotensive, developed A. fib with RVR and was transferred to ICU.  During this event, there was reported episode of emesis with dark material, acute encephalopathy.  Patient is started on Neo-Synephrine, heparin drip as well as amiodarone drips.  This morning labs revealed acute drop in hemoglobin from 9 to 4.5 within last 24 hours and new onset of leukocytosis.  Repeat hemoglobin was 4.4.  Therefore, GI has been consulted for further evaluation.  Patient had a small bowel movement in the ICU which was dark green per her nurse.  Patient was lethargic and answers minimal questions.  She denied any abdominal pain, nausea or vomiting.  Her heart rate is currently under control on amiodarone drip.  She did not have any further episodes of emesis.  She is receiving first unit of PRBCs.  Her blood pressure is within normal limits.  Heparin has been held   NSAIDs: Unknown  Antiplts/Anticoagulants/Anti thrombotics: None  GI Procedures: Unknown  Past Medical History:  Diagnosis Date  . Arthritis   . CHF (congestive heart failure) ([facility] [place])   . Diabetes mellitus without  complication ([place])   . Hypertension   . Renal disorder     Past Surgical History:  Procedure Laterality Date  . BACK SURGERY    . DIALYSIS/PERMA CATHETER INSERTION N/A 04/29/2019   Procedure: DIALYSIS/PERMA CATHETER INSERTION;  Surgeon: Algernon Huxley, MD;  Location: [facility];  Service: Cardiovascular;  Laterality: N/A;  . PACEMAKER PLACEMENT    . TUBAL LIGATION      Prior to Admission medications   Medication Sig Start Date End Date Taking? Authorizing Provider  allopurinol (ZYLOPRIM) 100 MG tablet Take 100 mg by mouth daily. 12/07/18  Yes [provider]  amLODipine (NORVASC) 5 MG tablet Take 5 mg by mouth daily. 11/02/18  Yes [provider]  aspirin 81 MG chewable tablet Chew 81 mg by mouth daily.   Yes [provider]  atorvastatin (LIPITOR) 20 MG tablet Take 20 mg by mouth daily. 12/27/18  Yes [provider]  Cholecalciferol (VITAMIN D-3) 25 MCG (1000 UT) CAPS Take 2 capsules by mouth daily.   Yes [provider]  ferrous sulfate 325 (65 FE) MG tablet Take 325 mg by mouth 2 (two) times daily. 10/04/18  Yes [provider]  hydrALAZINE (APRESOLINE) 100 MG tablet Take 100 mg by mouth 3 (three) times daily. 10/04/18  Yes [provider]  LEVEMIR FLEXTOUCH 100 UNIT/ML Pen SMARTSIG:6 Unit(s) SUB-Q Every Night 07/20/18  Yes [provider]  metoprolol tartrate (LOPRESSOR) 100 MG tablet Take 100 mg by mouth  2 (two) times daily. 12/27/18  Yes [provider]  torsemide (DEMADEX) 20 MG tablet Take 2 tablets (40 mg total) by mouth daily. Patient taking differently: Take 40 mg by mouth 2 (two) times daily.  01/23/19  Yes Thornell Mule, MD  vitamin B-12 (CYANOCOBALAMIN) 1000 MCG tablet Take 1,000 mcg by mouth daily.   Yes [provider]    Current Facility-Administered Medications:  .  0.9 %  sodium chloride infusion, 250 mL, Intravenous, PRN, Dew, Erskine Squibb, MD .  0.9 %  sodium chloride infusion,  250 mL, Intravenous, Continuous, Blakeney, Dreama Saa, NP, Stopped at 04/19/19 1512 .  acetaminophen (TYLENOL) tablet 650 mg, 650 mg, Oral, Q4H PRN, Algernon Huxley, MD .  allopurinol (ZYLOPRIM) tablet 100 mg, 100 mg, Oral, Daily, Dew, Erskine Squibb, MD, 100 mg at 04/18/19 1515 .  ALPRAZolam (XANAX) tablet 0.25 mg, 0.25 mg, Oral, BID PRN, Lucky Cowboy, Erskine Squibb, MD .  alum & mag hydroxide-simeth (MAALOX/MYLANTA) 200-200-20 MG/5ML suspension 30 mL, 30 mL, Oral, Q6H PRN, Nicole Kindred A, DO .  [COMPLETED] amiodarone (NEXTERONE) 1.8 mg/mL load via infusion 150 mg, 150 mg, Intravenous, Once, 150 mg at 04/19/19 0540 **FOLLOWED BY** [EXPIRED] amiodarone (NEXTERONE PREMIX) 360-4.14 MG/200ML-% (1.8 mg/mL) IV infusion, 60 mg/hr, Intravenous, Continuous, Last Rate: 16.67 mL/hr at 04/19/19 1150, 30 mg/hr at 04/19/19 1150 **FOLLOWED BY** amiodarone (NEXTERONE PREMIX) 360-4.14 MG/200ML-% (1.8 mg/mL) IV infusion, 30 mg/hr, Intravenous, Continuous, Sharion Settler, NP, Last Rate: 16.67 mL/hr at 04/19/19 1700, 30 mg/hr at 04/19/19 1700 .  atorvastatin (LIPITOR) tablet 20 mg, 20 mg, Oral, Daily, Algernon Huxley, MD, 20 mg at 04/19/19 0811 .  Chlorhexidine Gluconate Cloth 2 % PADS 6 each, 6 each, Topical, Q0600, Lavonia Dana, MD, 6 each at 04/19/19 0544 .  cholecalciferol (VITAMIN D3) tablet 2,000 Units, 2,000 Units, Oral, Daily, Algernon Huxley, MD, 2,000 Units at 04/19/19 0811 .  epoetin alfa (EPOGEN) injection 4,000 Units, 4,000 Units, Intravenous, Q T,Th,Sa-HD, Murlean Iba, MD, 4,000 Units at 04/19/19 1302 .  feeding supplement (NEPRO CARB STEADY) liquid 237 mL, 237 mL, Oral, BID BM, Candiss Norse, Har[place], MD, 237 mL at 04/17/19 1519 .  HYDROcodone-acetaminophen (NORCO/VICODIN) 5-325 MG per tablet 1-2 tablet, 1-2 tablet, Oral, Q4H PRN, Dew, Erskine Squibb, MD .  insulin aspart (novoLOG) injection 0-15 Units, 0-15 Units, Subcutaneous, TID WC, Algernon Huxley, MD, 3 Units at 04/19/19 1158 .  midodrine (PROAMATINE) tablet 5 mg, 5 mg, Oral, TID WC, Dew,  Erskine Squibb, MD, 5 mg at 04/19/19 1302 .  ondansetron (ZOFRAN) injection 4 mg, 4 mg, Intravenous, Q6H PRN, Algernon Huxley, MD, 4 mg at 04/18/19 2258 .  ondansetron (ZOFRAN) tablet 4 mg, 4 mg, Oral, Q6H PRN **OR** [DISCONTINUED] ondansetron (ZOFRAN) injection 4 mg, 4 mg, Intravenous, Q6H PRN, Athena Masse, MD .  pantoprazole (PROTONIX) injection 40 mg, 40 mg, Intravenous, Q12H, Tyler Pita, MD .  phenylephrine (NEO-SYNEPHRINE) 10 mg in sodium chloride 0.9 % 250 mL (0.04 mg/mL) infusion, 25-200 mcg/min, Intravenous, Titrated, Awilda Bill, NP, Last Rate: 60 mL/hr at 04/19/19 1604, 40 mcg/min at 04/19/19 1604 .  senna-docusate (Senokot-S) tablet 1 tablet, 1 tablet, Oral, QHS PRN, Dew, Erskine Squibb, MD .  sodium chloride flush (NS) 0.9 % injection 3 mL, 3 mL, Intravenous, Q12H, Dew, Erskine Squibb, MD, 3 mL at 04/19/19 0814 .  vitamin B-12 (CYANOCOBALAMIN) tablet 1,000 mcg, 1,000 mcg, Oral, Daily, Algernon Huxley, MD, 1,000 mcg at 04/19/19 5852  History reviewed. No pertinent family history.   Social  History   Tobacco Use  . Smoking status: Never Smoker  . Smokeless tobacco: Never Used  Substance Use Topics  . Alcohol use: Never  . Drug use: Never    Allergies as of 04/16/2019  . (No Known Allergies)    Review of Systems:    All systems reviewed and negative except where noted in HPI.   Physical Exam:  Vital signs in last 24 hours: Temp:  [97.4 F (36.3 C)-98.4 F (36.9 C)] 97.5 F (36.4 C) (04/13 1826) Pulse Rate:  [77-128] 78 (04/13 1826) Resp:  [13-30] 20 (04/13 1826) BP: (82-132)/(34-84) 126/56 (04/13 1826) SpO2:  [88 %-100 %] 88 % (04/13 1815) Weight:  [103.1 kg] 103.1 kg (04/13 0500) Last BM Date: 04/18/19 General: Lethargic, follows simple commands, cooperative in NAD Head:  Normocephalic and atraumatic. Eyes:   No icterus.   Conjunctiva pale. PERRLA. Ears:  Normal auditory acuity. Neck:  Supple; no masses or thyroidomegaly Lungs: Respirations even and unlabored. Lungs clear  to auscultation bilaterally.   No wheezes, crackles, or rhonchi.  Heart:  Regular rate and irregular rhythm;  Without murmur, clicks, rubs or gallops Abdomen:  Soft, nondistended, nontender. Normal bowel sounds. No appreciable masses or hepatomegaly.  No rebound or guarding.  Rectal:  Not performed. Msk:  Symmetrical without gross deformities.  Extremities:  Without edema, cyanosis or clubbing. Neurologic:  Alert and oriented x1; Skin:  Intact without significant lesions or rashes.   LAB RESULTS: CBC Latest Ref Rng & Units 04/19/2019 04/18/2019 04/17/2019  WBC 4.0 - 10.5 K/uL 15.2(H) 8.9 9.7  Hemoglobin 12.0 - 15.0 g/dL 4.5(LL) 9.0(L) 9.7(L)  Hematocrit 36.0 - 46.0 % 14.4(LL) 28.5(L) 29.4(L)  Platelets 150 - 400 K/uL 111(L) 170 184    BMET BMP Latest Ref Rng & Units 04/19/2019 04/18/2019 04/17/2019  Glucose 70 - 99 mg/dL 167(H) 107(H) 102(H)  BUN 8 - 23 mg/dL 48(H) 36(H) 24(H)  Creatinine 0.44 - 1.00 mg/dL 1.71(H) 2.27(H) 2.02(H)  Sodium 135 - 145 mmol/L 141 141 140  Potassium 3.5 - 5.1 mmol/L 4.5 4.0 4.0  Chloride 98 - 111 mmol/L 102 101 100  CO2 22 - 32 mmol/L 28 30 31   Calcium 8.9 - 10.3 mg/dL 8.1(L) 9.0 8.5(L)    LFT Hepatic Function Latest Ref Rng & Units 04/19/2019 04/16/2019 04/25/2019  Total Protein 6.5 - 8.1 g/dL 4.5(L) - 7.3  Albumin 3.5 - 5.0 g/dL 2.7(L) 3.0(L) 4.3  AST 15 - 41 U/L 22 - 26  ALT 0 - 44 U/L 14 - 26  Alk Phosphatase 38 - 126 U/L 48 - 110  Total Bilirubin 0.3 - 1.2 mg/dL 0.9 - 0.8     STUDIES: CT ABDOMEN PELVIS WO CONTRAST  Result Date: 04/19/2019 CLINICAL DATA:  Hypotension and arrhythmia. Dropping hemoglobin. EXAM: CT ABDOMEN AND PELVIS WITHOUT CONTRAST TECHNIQUE: Multidetector CT imaging of the abdomen and pelvis was performed following the standard protocol without IV contrast. COMPARISON:  One-view chest x-ray 04/19/2019 FINDINGS: Lower chest: Bilateral pleural effusions are present. Associated atelectasis is present. Pacing wires are in place. Heart is  mildly enlarged. No significant pericardial effusion is present. Lungs are otherwise clear. Hepatobiliary: No focal liver abnormality is seen. No gallstones, gallbladder wall thickening, or biliary dilatation. Pancreas: Unremarkable. No pancreatic ductal dilatation or surrounding inflammatory changes. Spleen: The spleen is somewhat atrophic. No focal lesions are present. Adrenals/Urinary Tract: The adrenal glands are normal bilaterally. Kidneys are somewhat heterogeneous with subcentimeter exophytic lesions bilaterally. Hypodense lesion near the lower pole of the left kidney measures  up to 1.7 cm. This is consistent with known renal cysts. Stomach/Bowel: The stomach is moderately distended with fluid. Duodenum is unremarkable. Small bowel is within normal limits. Adhesions are suspected anteriorly without obstruction. Fluid is present in a paraumbilical hernia. Appendix is visualized and normal. The ascending and transverse colon is within normal limits. The descending colon is normal. Sigmoid colon demonstrates diverticular changes. Vascular/Lymphatic: Extensive atherosclerotic changes are present without aneurysm. No significant adenopathy is present. Reproductive: Uterus and bilateral adnexa are unremarkable. Other: Minimal fluid is present in the presacral space. No other significant intraperitoneal or retroperitoneal fluid is present. Extensive soft tissue edema is present bilaterally. Musculoskeletal: Multilevel degenerative changes are present. Lumbar spine is fused at L4-5. No focal lytic or blastic lesions are present. Hips are located and within normal limits. IMPRESSION: 1. Bilateral pleural effusions and diffuse soft tissue edema compatible with anasarca. 2. No other acute or focal abnormality to explain the patient's anemia. 3. Sigmoid diverticulosis without diverticulitis. 4. Multilevel degenerative changes in the lumbar spine. 5. Aortic Atherosclerosis (ICD10-I70.0). Electronically Signed   By:  San Morelle M.D.   On: 04/19/2019 17:40   DG Chest Port 1 View  Result Date: 04/19/2019 CLINICAL DATA:  79 year old female with history of acute respiratory failure. EXAM: PORTABLE CHEST 1 VIEW COMPARISON:  Chest x-ray 04/28/2019. FINDINGS: Right internal jugular PermCath with tip terminating at the superior cavoatrial junction. Left-sided pacemaker/AICD with tip projecting over the expected location of the right ventricle. Lung volumes are low. There is cephalization of the pulmonary vasculature and slight indistinctness of the interstitial markings suggestive of mild pulmonary edema. No definite pleural effusions. Mild cardiomegaly. Upper mediastinal contours are within normal limits. IMPRESSION: 1. The appearance the chest suggests mild congestive heart failure, as above. Electronically Signed   By: Vinnie Langton M.D.   On: 04/19/2019 07:24      Impression / Plan:   Michele Beck is a 79 y.o. female with history of CHF, diastolic heart failure, CKD is admitted with CHF exacerbation, bradycardia and worsening CKD, started on hemodialysis on 4/8.  GI is consulted due to acute anemia and an episode of dark emesis on 4/13  Acute anemia with new onset of leukocytosis, episode of dark emesis Differentials include retroperitoneal bleed or ischemic colitis in setting of bradycardia or peptic ulcer disease Recommend CT abdomen without contrast Recommend pantoprazole 40 mg IV twice daily Monitor CBC closely and transfuse to keep hemoglobin greater than 8 Defer endoscopic evaluation at this time as patient is on pressor support and self-limited episode of dark emesis only.  Risks overweigh benefits at this time Agree to continue hold heparin drip temporarily  Discussed my recommendations with ICU attending, Dr. Patsey Berthold  Thank you for involving me in the care of this patient.  GI will follow along with you    LOS: 9 days   Sherri Sear, MD  04/19/2019, 6:46 PM   Note: This  dictation was prepared with Dragon dictation along with smaller phrase technology. Any transcriptional errors that result from this process are unintentional.

## 2019-04-19 NOTE — Progress Notes (Signed)
PT Cancellation Note  Patient Details Name: Michele Beck MRN: [id number] DOB: 02-Sep-1940   Cancelled Treatment:    Reason Eval/Treat Not Completed: Medical issues which prohibited therapy. Per chart, pt transferred to CCU for decline in medical status. PT to complete PT orders, please re-consult when patient is medically appropriate for participation with rehab services.    Lieutenant Diego PT, DPT 8:18 AM,04/19/19

## 2019-04-19 NOTE — Progress Notes (Signed)
Notified son Heaven Meeker about patients's current progress and transfer to ICU 16.

## 2019-04-19 NOTE — Progress Notes (Signed)
[facility]  ROUNDING NOTE   Subjective:   Patient had to be transferred to the ICU earlier this morning for hypotension, arrhythmia, atrial fibrillation with RVR.  1 L of fluid bolus was given Hemoglobin dropped to 4.5    Objective:  Vital signs  eveningin last 24 hours:  Temp:  [97.8 F (36.6 C)-98.4 F (36.9 C)] 97.8 F (36.6 C) (04/13 0729) Pulse Rate:  [94-128] 95 (04/13 0930) Resp:  [13-30] 16 (04/13 0930) BP: (96-145)/(40-84) 115/52 (04/13 0930) SpO2:  [90 %-100 %] 100 % (04/13 0930) Weight:  [103.1 kg] 103.1 kg (04/13 0500)  Weight change: -2.134 kg Filed Weights   04/17/19 0409 04/18/19 0518 04/19/19 0500  Weight: 105.7 kg 105.2 kg 103.1 kg    Intake/Output: I/O last 3 completed shifts: In: 622 [P.O.:717] Out: 3050 [Urine:1250; Other:1800]   Intake/Output this shift:  No intake/output data recorded.  Physical Exam: General: NAD,   Head: Normocephalic, atraumatic. Moist oral mucosal membranes  Eyes: Anicteric,   Neck: Supple,   Lungs:  [place] O2, normal effort  Heart: Irregular rhythm  Abdomen:  Soft, nontender,   Extremities:  + peripheral edema.  Neurologic:  Lethargic but able to answer simple questions  Skin: No lesions  Access: RIJ permcath 4/8 Dr. Lucky Cowboy    Basic Metabolic Panel: Recent Labs  Lab 04/15/19 0804 04/15/19 0804 04/16/19 1019 04/16/19 1019 04/17/19 0521 04/18/19 0412 04/19/19 0416  NA 139  --  139  --  140 141 141  K 4.6  --  4.2  --  4.0 4.0 4.5  CL 99  --  99  --  100 101 102  CO2 29  --  29  --  31 30 28   GLUCOSE 116*  --  115*  --  102* 107* 167*  BUN 59*  --  34*  --  24* 36* 48*  CREATININE 4.48*  --  2.74*  --  2.02* 2.27* 1.71*  CALCIUM 8.7*   < > 8.5*   < > 8.5* 9.0 8.1*  MG  --   --   --   --   --   --  1.6*  PHOS  --   --  3.2  --   --   --   --    < > = values in this interval not displayed.    Liver Function Tests: Recent Labs  Lab 04/16/19 1019 04/19/19 0416  AST  --  22  ALT  --  14  ALKPHOS   --  48  BILITOT  --  0.9  PROT  --  4.5*  ALBUMIN 3.0* 2.7*   No results for input(s): LIPASE, AMYLASE in the last 168 hours. No results for input(s): AMMONIA in the last 168 hours.  CBC: Recent Labs  Lab 04/13/19 0612 04/15/19 0804 04/16/19 1019 04/17/19 0521 04/18/19 0412  WBC 6.6 8.0 8.4 9.7 8.9  NEUTROABS 5.3  --   --   --   --   HGB 11.7* 10.3* 9.7* 9.7* 9.0*  HCT 36.0 32.2* 29.7* 29.4* 28.5*  MCV 90.7 95.0 92.5 91.6 94.7  PLT 180 196 179 184 170    Cardiac Enzymes: No results for input(s): CKTOTAL, CKMB, CKMBINDEX, TROPONINI in the last 168 hours.  BNP: Invalid input(s): POCBNP  CBG: Recent Labs  Lab 04/17/19 2116 04/18/19 0804 04/18/19 1703 04/18/19 2118 04/19/19 0759  GLUCAP 127* 88 130* 155* 162*    Microbiology: Results for orders placed or performed during the hospital encounter  of 04/09/2019  SARS CORONAVIRUS 2 (TAT 6-24 HRS) Nasopharyngeal Nasopharyngeal Swab     Status: None   Collection Time: 04/30/2019  9:28 PM   Specimen: Nasopharyngeal Swab  Result Value Ref Range Status   SARS Coronavirus 2 NEGATIVE NEGATIVE Final    Comment: (NOTE) SARS-CoV-2 target nucleic acids are NOT DETECTED. The SARS-CoV-2 RNA is generally detectable in upper and lower respiratory specimens during the acute phase of infection. Negative results do not preclude SARS-CoV-2 infection, do not rule out co-infections with other pathogens, and should not be used as the sole basis for treatment or other patient management decisions. Negative results must be combined with clinical observations, patient history, and epidemiological information. The expected result is Negative. Fact Sheet for Patients: SugarRoll.be Fact Sheet for Healthcare Providers: https://www.woods-mathews.com/ This test is not yet approved or cleared by the [place] FDA and  has been authorized for detection and/or diagnosis of SARS-CoV-2 by FDA under an  Emergency Use Authorization (EUA). This EUA will remain  in effect (meaning this test can be used) for the duration of the COVID-19 declaration under Section 56 4(b)(1) of the Act, 21 U.S.C. section 360bbb-3(b)(1), unless the authorization is terminated or revoked sooner. Performed at [facility], [place] [address], [place], [place] [address]   Urine Culture     Status: None   Collection Time: 04/13/19  9:11 PM   Specimen: Urine, Random  Result Value Ref Range Status   Specimen Description   Final    URINE, RANDOM Performed at [facility], [address], [place], [place] [address]    Special Requests   Final    NONE Performed at [facility], [address], [place], [place] [address]    Culture   Final    NO GROWTH Performed at [facility], [place] [address] [place], [place] [address]    Report Status 04/15/2019 FINAL  Final    Coagulation Studies: No results for input(s): LABPROT, INR in the last 72 hours.  Urinalysis: No results for input(s): COLORURINE, LABSPEC, PHURINE, GLUCOSEU, HGBUR, BILIRUBINUR, KETONESUR, PROTEINUR, UROBILINOGEN, NITRITE, LEUKOCYTESUR in the last 72 hours.  Invalid input(s): APPERANCEUR    Imaging: DG Chest Port 1 View  Result Date: 04/19/2019 CLINICAL DATA:  79 year old female with history of acute respiratory failure. EXAM: PORTABLE CHEST 1 VIEW COMPARISON:  Chest x-ray 05/04/2019. FINDINGS: Right internal jugular PermCath with tip terminating at the superior cavoatrial junction. Left-sided pacemaker/AICD with tip projecting over the expected location of the right ventricle. Lung volumes are low. There is cephalization of the pulmonary vasculature and slight indistinctness of the interstitial markings suggestive of mild pulmonary edema. No definite pleural effusions. Mild cardiomegaly. Upper mediastinal contours are within normal limits. IMPRESSION: 1. The appearance the chest suggests mild congestive heart failure, as  above. Electronically Signed   By: Vinnie Langton M.D.   On: 04/19/2019 07:24     Medications:   . sodium chloride    . sodium chloride    . amiodarone 60 mg/hr (04/19/19 0810)   Followed by  . amiodarone    . heparin 1,000 Units/hr (04/19/19 0160)  . phenylephrine (NEO-SYNEPHRINE) Adult infusion     . allopurinol  100 mg Oral Daily  . aspirin  81 mg Oral Daily  . atorvastatin  20 mg Oral Daily  . Chlorhexidine Gluconate Cloth  6 each Topical Q0600  . cholecalciferol  2,000 Units Oral Daily  . epoetin (EPOGEN/PROCRIT) injection  4,000 Units Intravenous Q T,Th,Sa-HD  .  feeding supplement (NEPRO CARB STEADY)  237 mL Oral BID BM  . ferrous sulfate  325 mg Oral BID  . insulin aspart  0-15 Units Subcutaneous TID WC  . midodrine  5 mg Oral TID WC  . patiromer  16.8 g Oral Daily  . sodium chloride flush  3 mL Intravenous Q12H  . vitamin B-12  1,000 mcg Oral Daily   sodium chloride, acetaminophen, ALPRAZolam, alum & mag hydroxide-simeth, HYDROcodone-acetaminophen, ondansetron (ZOFRAN) IV, ondansetron **OR** [DISCONTINUED] ondansetron (ZOFRAN) IV, senna-docusate  Assessment/ Plan:  Ms. Jeri Jeanbaptiste is a 79 y.o. black female with hypertension, diabetes mellitus type II, congestive heart failure, gout, hyperlipidemia, pacemaker, back surgery who is admitted to [facility] on 04/08/2019 for Hyperkalemia [E87.5] Weakness [N82.9] Chronic diastolic congestive heart failure ([place]) [I50.32] AKI (acute kidney injury) ([place]) [N17.9] Fall, initial encounter [W19.XXXA] Acute on chronic diastolic (congestive) heart failure ([place]) [I50.33]  #. 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  - Outpatient planning in progress for dialysis unit in [facility] Patient's primary Nephrologist, Dr. Lindalou Hose with [facility] [place], [place]) -Agree with holding Lasix because of hemodynamic instability today HD schedule Monday Wednesday  Friday Continue midodrine for blood pressure support -We will consider hemodialysis tomorrow based on hemodynamic stability   # Diabetes mellitus type II with chronic kidney disease:  Lab Results  Component Value Date   HGBA1C 6.5 (H) 04/27/2019    #. Secondary Hyperparathyroidism: Lab Results  Component Value Date   PTH 107 (H) 04/12/2019   CALCIUM 8.1 (L) 04/19/2019   PHOS 3.2 04/16/2019  continue ergocalciferol supplements  #. Anemia with chronic kidney disease:  Lab Results  Component Value Date   HGB 9.0 (L) 04/18/2019  EPO with HD Acute drop in hemoglobin is noted. Blood transfusion is planned   LOS: 9 Michele Beck 4/13/20219:51 AM

## 2019-04-19 NOTE — Progress Notes (Signed)
PROGRESS NOTE    Michele Beck   XTG:[id number]  DOB: [birth year]  PCP: System, Pcp Not In    DOA: 05/05/2019 LOS: 9   Brief Narrative   Michele Beck is an 79 y.o. femalewith past medical history significant forCKD 4, diastolic CHF (admitted in Jan 2021 for decompensation), pulmonary HTN, DM 2, HTN, gout and obesity. Patient has been feeling weak for some time, seen in the emergency room 04/02/2019 with concern for worsening lower extremity edema, referred to the heart failure clinic where she was treated with IV Lasix.  Now presented to the ED on 4/4 following a fall at home while trying to get up from her recliner.  In the ED, UA concerning for UTI, creatinine above baseline CKD.  Admitted to hospitalist service for further evaluation and management.   Assessment & Plan   Principal Problem:   ESRD (end stage renal disease) ([place]) Active Problems:   Type 2 diabetes mellitus with stage 4 chronic kidney disease (HCC)   Essential hypertension   Pulmonary HTN (HCC)   Acute on chronic diastolic heart failure (HCC)   AKI (acute kidney injury) ([place])   UTI (urinary tract infection)   Generalized weakness   Fall at home, initial encounter   Obesity, Class III, BMI 40-49.9 (morbid obesity) ([place])   Acute on chronic diastolic (congestive) heart failure (HCC)   Hyperkalemia   Sinus bradycardia  Patient in Stepdown unit on pressors, PCCM also following.  Afib with RVR - onset overnight last night, with hypotension.  Unclear if any history.  Likely triggered by her acute anemia.  Expect resolution with treatment of anemia. --heparin gtt  --amio gtt --consider cardiology consult tomorrow  Hypotension - requiring pressors, transferred to stepdown.  Due to A-fib RVR and anemia. --pressors per PCCM  --maintain MAP > 65  Acute on Chronic Anemia - Hbg dropped from 9 to 4.5 today, getting transfusion.  Possible GI bleeding, but unclear at this time.   --Trend H&H's --Transfuse if Hbg<  7 --Caution with volume, dialysis patient, would discuss with nephrology. --PCCM has CT abdomen/pelvis ordered to evaluate for retroperitoneal bleed. --GI consulted   ESRD - now on dialysis, initiated on 4/8.   Permcath placed 4/8.  Outpatient dialysis will be Monday Wednesday Friday at 3:30 PM at [facility] Hyperkalemia - due to above.  Resolved. --Nephrology following --Dialysis MWF while here --Continue [place] --Avoid nephrotoxins and hypotension. No ACE inhibitor/ARB. --Lasix per nephrology --Continue midodrine for BP support  UTI (urinary tract infection) -presented with generalized weakness status post fall.  Urinalysis concerning for UTI with moderate leukocytes, many bacteria and greater than 50 WBCs.  Urine culture returned negative.  Stop antibiotics and monitor.  Patient remained asymptomatic and afebrile.  Acute on chronic diastolic heart failure  Severe pulmonary hypertension Echocardiogram showed EF 55 to 27%, grade 1 diastolic dysfunction, severe tricuspid regurgitation --Continue IV Lasix if BP allows --Continue Midodrine  --daily weights, strict I/O's  Sinus bradycardia - asymptomatic.   --Hold metoprolol. --Telemetry monitoring  Generalized weakness, right knee arthritis Fall at home, subsequent encounter besity, Class III, BMI 40-49.9 (morbid obesity)  Morbid obesity a complicating factor PT and OT evaluations are pending due to hyperkalemia and renal failure  Type 2 diabetes mellitus  Insulin sliding scale as needed  Essential hypertension -Holding amlodipine and hydralazine due to soft blood pressures  Patient BMI: Body mass index is 40.26 kg/m.   DVT prophylaxis: Heparin Diet:  Diet Orders (From admission, onward)    Start  Ordered   04/13/2019 1115  Diet renal/carb modified with fluid restriction Diet-HS Snack? Nothing; Fluid restriction: 1200 mL Fluid; Room service appropriate? Yes; Fluid consistency: Thin  Diet effective now     Question Answer Comment  Diet-HS Snack? Nothing   Fluid restriction: 1200 mL Fluid   Room service appropriate? Yes   Fluid consistency: Thin      04/27/2019 1114            Code Status: Full Code    Subjective 04/19/19    Patient seen at bedside in stepdown unit this morning.  She reports feeling fair today.  Denies any pain or discomfort.  Reports she vomited once yesterday and developed black.  She denies any bright red blood per rectum or melena.  Denies fevers chills, chest pain or shortness of breath, nausea vomiting or diarrhea.   Disposition Plan & Communication   Dispo & Barriers: Patient was supposed to be discharged home with home health yesterday however her son could not pick her up.  Overnight she went into A. fib RVR became hypotensive and acutely anemic and now in stepdown unit.  Dispo is to be determined pending recovery from above. Coming from: Home, lives in [place] Exp d/c date: TBD Medically stable for d/c?  No  Family Communication: None at bedside during encounter, will attempt to call   Consults, Procedures, Significant Events   Consultants:   Nephrology  Procedures:   None  Antimicrobials:   None   Objective   Vitals:   04/19/19 1614 04/19/19 1629 04/19/19 1700 04/19/19 1715  BP: (!) 120/38 (!) 126/54 (!) 120/45   Pulse: 86 87 88 79  Resp: 14 18 18 18   Temp: (!) 97.4 F (36.3 C) (!) 97.4 F (36.3 C)    TempSrc: Oral     SpO2:   100% 100%  Weight:      Height:        Intake/Output Summary (Last 24 hours) at 04/19/2019 1728 Last data filed at 04/19/2019 1700 Gross per 24 hour  Intake 663.19 ml  Output 250 ml  Net 413.19 ml   Filed Weights   04/17/19 0409 04/18/19 0518 04/19/19 0500  Weight: 105.7 kg 105.2 kg 103.1 kg    Physical Exam:  General exam: no acute distress, obese Respiratory system: Decreased breath sounds, normal respiratory effort. Cardiovascular system: normal S1/S2, RRR, bilateral lower extremity  tense pitting edema, PermCath in place Gastrointestinal system: Abdomen is soft and nontender, nondistended Skin: Dry, intact, normal temperature Extremities: Lateral lower extremity edema, moves all extremities Psychiatry: Normal mood, congruent affect, judgment and insight appear normal.   Labs   Data Reviewed: I have personally reviewed following labs and imaging studies  CBC: Recent Labs  Lab 04/13/19 0612 04/13/19 0612 04/15/19 0804 04/16/19 1019 04/17/19 0521 04/18/19 0412 04/19/19 0948  WBC 6.6   < > 8.0 8.4 9.7 8.9 15.2*  NEUTROABS 5.3  --   --   --   --   --  12.3*  HGB 11.7*   < > 10.3* 9.7* 9.7* 9.0* 4.5*  HCT 36.0   < > 32.2* 29.7* 29.4* 28.5* 14.4*  MCV 90.7   < > 95.0 92.5 91.6 94.7 97.3  PLT 180   < > 196 179 184 170 111*   < > = values in this interval not displayed.   Basic Metabolic Panel: Recent Labs  Lab 04/15/19 0804 04/16/19 1019 04/17/19 0521 04/18/19 0412 04/19/19 0416  NA 139 139 140 141 141  K 4.6 4.2 4.0 4.0 4.5  CL 99 99 100 101 102  CO2 29 29 31 30 28   GLUCOSE 116* 115* 102* 107* 167*  BUN 59* 34* 24* 36* 48*  CREATININE 4.48* 2.74* 2.02* 2.27* 1.71*  CALCIUM 8.7* 8.5* 8.5* 9.0 8.1*  MG  --   --   --   --  1.6*  PHOS  --  3.2  --   --   --    GFR: Estimated Creatinine Clearance: 31.1 mL/min (A) (by C-G formula based on SCr of 1.71 mg/dL (H)). Liver Function Tests: Recent Labs  Lab 04/16/19 1019 04/19/19 0416  AST  --  22  ALT  --  14  ALKPHOS  --  48  BILITOT  --  0.9  PROT  --  4.5*  ALBUMIN 3.0* 2.7*   No results for input(s): LIPASE, AMYLASE in the last 168 hours. No results for input(s): AMMONIA in the last 168 hours. Coagulation Profile: Recent Labs  Lab 04/19/19 0948  INR 1.5*   Cardiac Enzymes: No results for input(s): CKTOTAL, CKMB, CKMBINDEX, TROPONINI in the last 168 hours. BNP (last 3 results) No results for input(s): PROBNP in the last 8760 hours. HbA1C: No results for input(s): HGBA1C in the last 72  hours. CBG: Recent Labs  Lab 04/18/19 1703 04/18/19 2118 04/19/19 0759 04/19/19 1132 04/19/19 1622  GLUCAP 130* 155* 162* 145* 136*   Lipid Profile: No results for input(s): CHOL, HDL, LDLCALC, TRIG, CHOLHDL, LDLDIRECT in the last 72 hours. Thyroid Function Tests: No results for input(s): TSH, T4TOTAL, FREET4, T3FREE, THYROIDAB in the last 72 hours. Anemia Panel: No results for input(s): VITAMINB12, FOLATE, FERRITIN, TIBC, IRON, RETICCTPCT in the last 72 hours. Sepsis Labs: Recent Labs  Lab 04/19/19 0844  PROCALCITON 0.43    Recent Results (from the past 240 hour(s))  SARS CORONAVIRUS 2 (TAT 6-24 HRS) Nasopharyngeal Nasopharyngeal Swab     Status: None   Collection Time: 04/23/2019  9:28 PM   Specimen: Nasopharyngeal Swab  Result Value Ref Range Status   SARS Coronavirus 2 NEGATIVE NEGATIVE Final    Comment: (NOTE) SARS-CoV-2 target nucleic acids are NOT DETECTED. The SARS-CoV-2 RNA is generally detectable in upper and lower respiratory specimens during the acute phase of infection. Negative results do not preclude SARS-CoV-2 infection, do not rule out co-infections with other pathogens, and should not be used as the sole basis for treatment or other patient management decisions. Negative results must be combined with clinical observations, patient history, and epidemiological information. The expected result is Negative. Fact Sheet for Patients: SugarRoll.be Fact Sheet for Healthcare Providers: https://www.woods-mathews.com/ This test is not yet approved or cleared by the [place] FDA and  has been authorized for detection and/or diagnosis of SARS-CoV-2 by FDA under an Emergency Use Authorization (EUA). This EUA will remain  in effect (meaning this test can be used) for the duration of the COVID-19 declaration under Section 56 4(b)(1) of the Act, 21 U.S.C. section 360bbb-3(b)(1), unless the authorization is terminated  or revoked sooner. Performed at [facility], [place] [address] [place], [place] [address]   Urine Culture     Status: None   Collection Time: 04/13/19  9:11 PM   Specimen: Urine, Random  Result Value Ref Range Status   Specimen Description   Final    URINE, RANDOM Performed at [facility], [address], [place], [place] [address]    Special Requests   Final    NONE Performed at  [facility], [address] [place], [place] [address]    Culture   Final    NO GROWTH Performed at [facility], [place] [address] [place], [place] [address]    Report Status 04/15/2019 FINAL  Final      Imaging Studies   DG Chest Port 1 View  Result Date: 04/19/2019 CLINICAL DATA:  79 year old female with history of acute respiratory failure. EXAM: PORTABLE CHEST 1 VIEW COMPARISON:  Chest x-ray 04/17/2019. FINDINGS: Right internal jugular PermCath with tip terminating at the superior cavoatrial junction. Left-sided pacemaker/AICD with tip projecting over the expected location of the right ventricle. Lung volumes are low. There is cephalization of the pulmonary vasculature and slight indistinctness of the interstitial markings suggestive of mild pulmonary edema. No definite pleural effusions. Mild cardiomegaly. Upper mediastinal contours are within normal limits. IMPRESSION: 1. The appearance the chest suggests mild congestive heart failure, as above. Electronically Signed   By: Vinnie Langton M.D.   On: 04/19/2019 07:24     Medications   Scheduled Meds: . allopurinol  100 mg Oral Daily  . atorvastatin  20 mg Oral Daily  . Chlorhexidine Gluconate Cloth  6 each Topical Q0600  . cholecalciferol  2,000 Units Oral Daily  . epoetin (EPOGEN/PROCRIT) injection  4,000 Units Intravenous Q T,Th,Sa-HD  . feeding supplement (NEPRO CARB STEADY)  237 mL Oral BID BM  . insulin aspart  0-15 Units Subcutaneous TID WC  . midodrine  5 mg Oral TID WC  . pantoprazole (PROTONIX) IV   40 mg Intravenous Q12H  . sodium chloride flush  3 mL Intravenous Q12H  . vitamin B-12  1,000 mcg Oral Daily   Continuous Infusions: . sodium chloride    . sodium chloride Stopped (04/19/19 1512)  . amiodarone 30 mg/hr (04/19/19 1700)  . phenylephrine (NEO-SYNEPHRINE) Adult infusion 40 mcg/min (04/19/19 1604)       LOS: 9 days    Time spent: 35 minutes    Ezekiel Slocumb, DO [facility]   If 7PM-7AM, please contact night-coverage [email or web address] 04/19/2019, 5:28 PM

## 2019-04-20 DIAGNOSIS — N186 End stage renal disease: Secondary | ICD-10-CM | POA: Diagnosis not present

## 2019-04-20 DIAGNOSIS — I5033 Acute on chronic diastolic (congestive) heart failure: Secondary | ICD-10-CM | POA: Diagnosis not present

## 2019-04-20 DIAGNOSIS — I272 Pulmonary hypertension, unspecified: Secondary | ICD-10-CM | POA: Diagnosis not present

## 2019-04-20 LAB — BLOOD GAS, ARTERIAL
Acid-Base Excess: 1.4 mmol/L (ref 0.0–2.0)
Bicarbonate: 30.9 mmol/L — ABNORMAL HIGH (ref 20.0–28.0)
FIO2: 0.28
O2 Saturation: 97.8 %
Patient temperature: 37
pCO2 arterial: 72 mmHg (ref 32.0–48.0)
pH, Arterial: 7.24 — ABNORMAL LOW (ref 7.350–7.450)
pO2, Arterial: 117 mmHg — ABNORMAL HIGH (ref 83.0–108.0)

## 2019-04-20 LAB — CBC WITH DIFFERENTIAL/PLATELET
Abs Immature Granulocytes: 0.54 10*3/uL — ABNORMAL HIGH (ref 0.00–0.07)
Basophils Absolute: 0 10*3/uL (ref 0.0–0.1)
Basophils Relative: 0 %
Eosinophils Absolute: 0 10*3/uL (ref 0.0–0.5)
Eosinophils Relative: 0 %
HCT: 19.8 % — ABNORMAL LOW (ref 36.0–46.0)
Hemoglobin: 6.5 g/dL — ABNORMAL LOW (ref 12.0–15.0)
Immature Granulocytes: 3 %
Lymphocytes Relative: 7 %
Lymphs Abs: 1.3 10*3/uL (ref 0.7–4.0)
MCH: 30.4 pg (ref 26.0–34.0)
MCHC: 32.8 g/dL (ref 30.0–36.0)
MCV: 92.5 fL (ref 80.0–100.0)
Monocytes Absolute: 1.5 10*3/uL — ABNORMAL HIGH (ref 0.1–1.0)
Monocytes Relative: 8 %
Neutro Abs: 15.4 10*3/uL — ABNORMAL HIGH (ref 1.7–7.7)
Neutrophils Relative %: 82 %
Platelets: 110 10*3/uL — ABNORMAL LOW (ref 150–400)
RBC: 2.14 MIL/uL — ABNORMAL LOW (ref 3.87–5.11)
RDW: 15.5 % (ref 11.5–15.5)
Smear Review: NORMAL
WBC: 18.8 10*3/uL — ABNORMAL HIGH (ref 4.0–10.5)
nRBC: 5.4 % — ABNORMAL HIGH (ref 0.0–0.2)

## 2019-04-20 LAB — BASIC METABOLIC PANEL
Anion gap: 8 (ref 5–15)
BUN: 82 mg/dL — ABNORMAL HIGH (ref 8–23)
CO2: 32 mmol/L (ref 22–32)
Calcium: 8 mg/dL — ABNORMAL LOW (ref 8.9–10.3)
Chloride: 103 mmol/L (ref 98–111)
Creatinine, Ser: 2.23 mg/dL — ABNORMAL HIGH (ref 0.44–1.00)
GFR calc Af Amer: 24 mL/min — ABNORMAL LOW (ref >60)
GFR calc non Af Amer: 20 mL/min — ABNORMAL LOW (ref >60)
Glucose, Bld: 182 mg/dL — ABNORMAL HIGH (ref 70–99)
Potassium: 4.2 mmol/L (ref 3.5–5.1)
Sodium: 143 mmol/L (ref 135–145)

## 2019-04-20 LAB — PHOSPHORUS: Phosphorus: 2.3 mg/dL — ABNORMAL LOW (ref 2.5–4.6)

## 2019-04-20 LAB — HEMOGLOBIN AND HEMATOCRIT, BLOOD
HCT: 19.3 % — ABNORMAL LOW (ref 36.0–46.0)
HCT: 19.9 % — ABNORMAL LOW (ref 36.0–46.0)
Hemoglobin: 6.3 g/dL — ABNORMAL LOW (ref 12.0–15.0)
Hemoglobin: 6.8 g/dL — ABNORMAL LOW (ref 12.0–15.0)

## 2019-04-20 LAB — PROCALCITONIN: Procalcitonin: 0.74 ng/mL

## 2019-04-20 LAB — GLUCOSE, CAPILLARY
Glucose-Capillary: 175 mg/dL — ABNORMAL HIGH (ref 70–99)
Glucose-Capillary: 164 mg/dL — ABNORMAL HIGH (ref 70–99)
Glucose-Capillary: 165 mg/dL — ABNORMAL HIGH (ref 70–99)

## 2019-04-20 LAB — MAGNESIUM: Magnesium: 2 mg/dL (ref 1.7–2.4)

## 2019-04-20 MED ORDER — MORPHINE SULFATE (PF) 2 MG/ML IV SOLN: 2.0000 mg | INTRAVENOUS | Status: DC | PRN

## 2019-04-20 MED ORDER — ACETAMINOPHEN 650 MG RE SUPP: 650.0000 mg | Freq: Four times a day (QID) | RECTAL | Status: DC | PRN

## 2019-04-20 MED ORDER — MORPHINE BOLUS VIA INFUSION
5.0000 mg | INTRAVENOUS | Status: DC | PRN
  Filled 2019-04-20: qty 5

## 2019-04-20 MED ORDER — SODIUM CHLORIDE 0.9 % IV SOLN: 250.0000 mL | INTRAVENOUS | Status: DC

## 2019-04-20 MED ORDER — POLYVINYL ALCOHOL 1.4 % OP SOLN
1.0000 [drp] | Freq: Four times a day (QID) | OPHTHALMIC | Status: DC | PRN
  Filled 2019-04-20: qty 15

## 2019-04-20 MED ORDER — MORPHINE SULFATE (PF) 2 MG/ML IV SOLN: 2.0000 mg | Freq: Once | INTRAVENOUS | Status: AC

## 2019-04-20 MED ORDER — GLYCOPYRROLATE 1 MG PO TABS
1.0000 mg | ORAL_TABLET | ORAL | Status: DC | PRN
  Filled 2019-04-20: qty 1

## 2019-04-20 MED ORDER — NOREPINEPHRINE 4 MG/250ML-% IV SOLN
INTRAVENOUS | Status: AC
  Administered 2019-04-20: 16:00:00 4 mg
  Filled 2019-04-20: qty 250

## 2019-04-20 MED ORDER — SODIUM CHLORIDE 0.9% IV SOLUTION: Freq: Once | INTRAVENOUS | Status: DC

## 2019-04-20 MED ORDER — DEXTROSE 5 % IV SOLN: INTRAVENOUS | Status: DC

## 2019-04-20 MED ORDER — MIDAZOLAM HCL 2 MG/2ML IJ SOLN: 2.0000 mg | INTRAMUSCULAR | Status: DC | PRN

## 2019-04-20 MED ORDER — GLYCOPYRROLATE 0.2 MG/ML IJ SOLN: 0.2000 mg | INTRAMUSCULAR | Status: DC | PRN

## 2019-04-20 MED ORDER — ACETAMINOPHEN 325 MG PO TABS: 650.0000 mg | ORAL_TABLET | Freq: Four times a day (QID) | ORAL | Status: DC | PRN

## 2019-04-20 MED ORDER — MORPHINE 100MG IN NS 100ML (1MG/ML) PREMIX INFUSION
0.0000 mg/h | INTRAVENOUS | Status: DC
  Administered 2019-04-20: 18:00:00 1 mg/h via INTRAVENOUS
  Filled 2019-04-20: qty 100

## 2019-04-20 MED ORDER — DIPHENHYDRAMINE HCL 50 MG/ML IJ SOLN: 25.0000 mg | INTRAMUSCULAR | Status: DC | PRN

## 2019-04-20 MED ORDER — MORPHINE SULFATE (PF) 2 MG/ML IV SOLN
INTRAVENOUS | Status: AC
  Administered 2019-04-20: 2 mg via INTRAVENOUS
  Filled 2019-04-20: qty 1

## 2019-04-20 NOTE — Progress Notes (Signed)
Post hd 

## 2019-04-20 NOTE — Progress Notes (Signed)
[facility]  ROUNDING NOTE   Subjective:   Blood transfusion yesterday Hgb up to 6.5  eval by GI team ongoing  Staff reports dar stool last night   Objective:  Vital signs  eveningin last 24 hours:  Temp:  [97.4 F (36.3 C)-97.6 F (36.4 C)] 97.6 F (36.4 C) (04/13 2023) Pulse Rate:  [76-95] 81 (04/14 0600) Resp:  [12-26] 20 (04/14 0600) BP: (82-141)/(34-82) 132/44 (04/14 0600) SpO2:  [88 %-100 %] 100 % (04/14 0600)  Weight change:  Filed Weights   04/17/19 0409 04/18/19 0518 04/19/19 0500  Weight: 105.7 kg 105.2 kg 103.1 kg    Intake/Output: I/O last 3 completed shifts: In: 1437.3 [I.V.:589.1; Blood:798.7; IV Piggyback:49.5] Out: -    Intake/Output this shift:  No intake/output data recorded.  Physical Exam: General: NAD,   Head: Normocephalic, atraumatic. Moist oral mucosal membranes  Eyes: Anicteric,   Neck: Supple,   Lungs:  [place] O2, OSA pattern of breathing  Heart: Regular sinus, harsh systolic murnur  Abdomen:  Soft, nontender,   Extremities:  +dependent peripheral edema.  Neurologic:  Lethargic but able to answer simple questions  Skin: No lesions  Access: RIJ permcath 4/8 Dr. Lucky Cowboy    Basic Metabolic Panel: Recent Labs  Lab 04/16/19 1019 04/16/19 1019 04/17/19 0521 04/17/19 0521 04/18/19 0412 04/19/19 0416 04/20/19 0337  NA 139  --  140  --  141 141 143  K 4.2  --  4.0  --  4.0 4.5 4.2  CL 99  --  100  --  101 102 103  CO2 29  --  31  --  30 28 32  GLUCOSE 115*  --  102*  --  107* 167* 182*  BUN 34*  --  24*  --  36* 48* 82*  CREATININE 2.74*  --  2.02*  --  2.27* 1.71* 2.23*  CALCIUM 8.5*   < > 8.5*   < > 9.0 8.1* 8.0*  MG  --   --   --   --   --  1.6* 2.0  PHOS 3.2  --   --   --   --   --  2.3*   < > = values in this interval not displayed.    Liver Function Tests: Recent Labs  Lab 04/16/19 1019 04/19/19 0416  AST  --  22  ALT  --  14  ALKPHOS  --  48  BILITOT  --  0.9  PROT  --  4.5*  ALBUMIN 3.0* 2.7*   No  results for input(s): LIPASE, AMYLASE in the last 168 hours. No results for input(s): AMMONIA in the last 168 hours.  CBC: Recent Labs  Lab 04/16/19 1019 04/16/19 1019 04/17/19 0521 04/18/19 0412 04/19/19 0948 04/19/19 2130 04/20/19 0337  WBC 8.4  --  9.7 8.9 15.2*  --  18.8*  NEUTROABS  --   --   --   --  12.3*  --  15.4*  HGB 9.7*   < > 9.7* 9.0* 4.5* 7.4* 6.5*  HCT 29.7*   < > 29.4* 28.5* 14.4* 22.3* 19.8*  MCV 92.5  --  91.6 94.7 97.3  --  92.5  PLT 179  --  184 170 111*  --  110*   < > = values in this interval not displayed.    Cardiac Enzymes: No results for input(s): CKTOTAL, CKMB, CKMBINDEX, TROPONINI in the last 168 hours.  BNP: Invalid input(s): POCBNP  CBG: Recent Labs  Lab 04/19/19  0102 04/19/19 1132 04/19/19 1622 04/19/19 1946 04/20/19 0735  GLUCAP 162* 145* 136* 157* 175*    Microbiology: Results for orders placed or performed during the hospital encounter of 04/11/2019  SARS CORONAVIRUS 2 (TAT 6-24 HRS) Nasopharyngeal Nasopharyngeal Swab     Status: None   Collection Time: 04/09/2019  9:28 PM   Specimen: Nasopharyngeal Swab  Result Value Ref Range Status   SARS Coronavirus 2 NEGATIVE NEGATIVE Final    Comment: (NOTE) SARS-CoV-2 target nucleic acids are NOT DETECTED. The SARS-CoV-2 RNA is generally detectable in upper and lower respiratory specimens during the acute phase of infection. Negative results do not preclude SARS-CoV-2 infection, do not rule out co-infections with other pathogens, and should not be used as the sole basis for treatment or other patient management decisions. Negative results must be combined with clinical observations, patient history, and epidemiological information. The expected result is Negative. Fact Sheet for Patients: SugarRoll.be Fact Sheet for Healthcare Providers: https://www.woods-mathews.com/ This test is not yet approved or cleared by the [place] FDA and  has  been authorized for detection and/or diagnosis of SARS-CoV-2 by FDA under an Emergency Use Authorization (EUA). This EUA will remain  in effect (meaning this test can be used) for the duration of the COVID-19 declaration under Section 56 4(b)(1) of the Act, 21 U.S.C. section 360bbb-3(b)(1), unless the authorization is terminated or revoked sooner. Performed at [facility], [place] [address] [place], [place] [address]   Urine Culture     Status: None   Collection Time: 04/13/19  9:11 PM   Specimen: Urine, Random  Result Value Ref Range Status   Specimen Description   Final    URINE, RANDOM Performed at [facility], [address], [place], [place] [address]    Special Requests   Final    NONE Performed at [facility], [address], [place], [place] [address]    Culture   Final    NO GROWTH Performed at [facility], [place] [address] [place], [place] [address]    Report Status 04/15/2019 FINAL  Final    Coagulation Studies: Recent Labs    04/19/19 0948  LABPROT 17.9*  INR 1.5*    Urinalysis: No results for input(s): COLORURINE, LABSPEC, PHURINE, GLUCOSEU, HGBUR, BILIRUBINUR, KETONESUR, PROTEINUR, UROBILINOGEN, NITRITE, LEUKOCYTESUR in the last 72 hours.  Invalid input(s): APPERANCEUR    Imaging: CT ABDOMEN PELVIS WO CONTRAST  Result Date: 04/19/2019 CLINICAL DATA:  Hypotension and arrhythmia. Dropping hemoglobin. EXAM: CT ABDOMEN AND PELVIS WITHOUT CONTRAST TECHNIQUE: Multidetector CT imaging of the abdomen and pelvis was performed following the standard protocol without IV contrast. COMPARISON:  One-view chest x-ray 04/19/2019 FINDINGS: Lower chest: Bilateral pleural effusions are present. Associated atelectasis is present. Pacing wires are in place. Heart is mildly enlarged. No significant pericardial effusion is present. Lungs are otherwise clear. Hepatobiliary: No focal liver abnormality is seen. No gallstones, gallbladder wall  thickening, or biliary dilatation. Pancreas: Unremarkable. No pancreatic ductal dilatation or surrounding inflammatory changes. Spleen: The spleen is somewhat atrophic. No focal lesions are present. Adrenals/Urinary Tract: The adrenal glands are normal bilaterally. Kidneys are somewhat heterogeneous with subcentimeter exophytic lesions bilaterally. Hypodense lesion near the lower pole of the left kidney measures up to 1.7 cm. This is consistent with known renal cysts. Stomach/Bowel: The stomach is moderately distended with fluid. Duodenum is unremarkable. Small bowel is within normal limits. Adhesions are suspected anteriorly without obstruction. Fluid is present in a paraumbilical hernia. Appendix is visualized and  normal. The ascending and transverse colon is within normal limits. The descending colon is normal. Sigmoid colon demonstrates diverticular changes. Vascular/Lymphatic: Extensive atherosclerotic changes are present without aneurysm. No significant adenopathy is present. Reproductive: Uterus and bilateral adnexa are unremarkable. Other: Minimal fluid is present in the presacral space. No other significant intraperitoneal or retroperitoneal fluid is present. Extensive soft tissue edema is present bilaterally. Musculoskeletal: Multilevel degenerative changes are present. Lumbar spine is fused at L4-5. No focal lytic or blastic lesions are present. Hips are located and within normal limits. IMPRESSION: 1. Bilateral pleural effusions and diffuse soft tissue edema compatible with anasarca. 2. No other acute or focal abnormality to explain the patient's anemia. 3. Sigmoid diverticulosis without diverticulitis. 4. Multilevel degenerative changes in the lumbar spine. 5. Aortic Atherosclerosis (ICD10-I70.0). Electronically Signed   By: San Morelle M.D.   On: 04/19/2019 17:40   DG Chest Port 1 View  Result Date: 04/19/2019 CLINICAL DATA:  79 year old female with history of acute respiratory failure.  EXAM: PORTABLE CHEST 1 VIEW COMPARISON:  Chest x-ray 04/23/2019. FINDINGS: Right internal jugular PermCath with tip terminating at the superior cavoatrial junction. Left-sided pacemaker/AICD with tip projecting over the expected location of the right ventricle. Lung volumes are low. There is cephalization of the pulmonary vasculature and slight indistinctness of the interstitial markings suggestive of mild pulmonary edema. No definite pleural effusions. Mild cardiomegaly. Upper mediastinal contours are within normal limits. IMPRESSION: 1. The appearance the chest suggests mild congestive heart failure, as above. Electronically Signed   By: Vinnie Langton M.D.   On: 04/19/2019 07:24     Medications:   . sodium chloride    . sodium chloride Stopped (04/19/19 1512)  . amiodarone 30 mg/hr (04/20/19 0600)  . phenylephrine (NEO-SYNEPHRINE) Adult infusion 35 mcg/min (04/20/19 0445)   . sodium chloride   Intravenous Once  . allopurinol  100 mg Oral Daily  . atorvastatin  20 mg Oral Daily  . Chlorhexidine Gluconate Cloth  6 each Topical Q0600  . cholecalciferol  2,000 Units Oral Daily  . epoetin (EPOGEN/PROCRIT) injection  4,000 Units Intravenous Q T,Th,Sa-HD  . feeding supplement (NEPRO CARB STEADY)  237 mL Oral BID BM  . insulin aspart  0-15 Units Subcutaneous TID WC  . midodrine  5 mg Oral TID WC  . pantoprazole (PROTONIX) IV  40 mg Intravenous Q12H  . sodium chloride flush  3 mL Intravenous Q12H  . vitamin B-12  1,000 mcg Oral Daily   sodium chloride, acetaminophen, ALPRAZolam, alum & mag hydroxide-simeth, HYDROcodone-acetaminophen, ondansetron (ZOFRAN) IV, ondansetron **OR** [DISCONTINUED] ondansetron (ZOFRAN) IV, senna-docusate  Assessment/ Plan:  Michele Beck is a 79 y.o. black female with hypertension, diabetes mellitus type II, congestive heart failure, gout, hyperlipidemia, pacemaker, back surgery who is [facility] to [facility] on [facility]19/2019 for Hyperkalemia [E87.5] Weakness  [E09.2] Chronic diastolic congestive heart failure ([place]) [I50.32] AKI (acute kidney injury) ([place]) [N17.9] Fall, initial encounter [W19.XXXA] Acute on chronic diastolic (congestive) heart failure ([place]) [I50.33]  04/11/19: 2 D echo: LVH, Gr 1 diastolic dysfunction, LVEF 55-60%, moderate Pulm HTN  #. 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  - Outpatient planning in progress for dialysis unit in [facility] Patient's primary Nephrologist, Dr. Lindalou Hose with [facility] [place], [place]) -Agree with holding Lasix because of hemodynamic instability today HD schedule Monday Wednesday Friday Continue midodrine for blood pressure support - HD today    # Diabetes mellitus type II with chronic kidney disease:  Lab Results  Component Value Date   HGBA1C 6.5 (H) 05/04/2019    #. Secondary Hyperparathyroidism: Lab Results  Component Value Date   PTH 107 (H) 04/12/2019   CALCIUM 8.0 (L) 04/20/2019   PHOS 2.3 (L) 04/20/2019  continue ergocalciferol supplements  #. Anemia with chronic kidney disease with Acute Gi bleed:  Lab Results  Component Value Date   HGB 6.5 (L) 04/20/2019  EPO with HD Acute drop in hemoglobin is noted. Blood transfusion is planned for today which can be given during HD   LOS: 10 Michele Beck 4/14/20218:47 AM

## 2019-04-20 NOTE — Progress Notes (Signed)
Family At bedside, clinical status relayed to family  Updated and notified of patients medical condition-  Progressive multiorgan failure with very low chance of meaningful recovery. Patient with progressive Resp, cardiac and renal failure  Family understands the situation.  They have consented and agreed to DNR/DNI.   Family are satisfied with Plan of action and management. All questions answered  Corrin Parker, M.D.  [facility]  Medical Director [place] Director [facility] Cardio-Pulmonary Department

## 2019-04-20 NOTE — Progress Notes (Signed)
Pt became very anxious, extensive labor brweathing.  RN notified , to bedside.  Sats unreadable.  Dialysis stopped.  Pt is a DNI/DNR.  Family brought in to bedside for comfort care.  Pt taken off BIPAP, ICU nurses resumed care of patient.

## 2019-04-20 NOTE — Progress Notes (Signed)
CRITICAL CARE NOTE  CC  follow up shock  HPI Prognosis is guarded LEVO at 12 On amiodarone Low H/H possible GIB   BP (!) 132/44   Pulse 81   Temp 97.6 F (36.4 C) (Oral)   Resp 20   Ht 5\' 3"  (1.6 m)   Wt 103.1 kg   SpO2 100%   BMI 40.26 kg/m    I/O last 3 completed shifts: In: 1437.3 [I.V.:589.1; Blood:798.7; IV Piggyback:49.5] Out: -  No intake/output data recorded.  SpO2: 100 % O2 Flow Rate (L/min): 2 L/min  Estimated body mass index is 40.26 kg/m as calculated from the following:   Height as of this encounter: 5\' 3"  (1.6 m).   Weight as of this encounter: 103.1 kg. LIMITED ROS due to lethargy arousable to name  Physical Examination:   General Appearance: No distress  Neuro:without focal findings,  speech normal,  HEENT: PERRLA, EOM intact.   Pulmonary: normal breath sounds, No wheezing.  CardiovascularNormal S1,S2.  No m/r/g.   Abdomen: Benign, Soft, non-tender. Renal:  No costovertebral tenderness  GU:  Not performed at this time. Endoc: No evident thyromegaly Skin:   warm, no rashes, no ecchymosis  Extremities: +edema PSYCHIATRIC: Mood, affect within normal limits.   ALL OTHER ROS ARE NEGATIVE   MEDICATIONS: I have reviewed all medications and confirmed regimen as documented   CULTURE RESULTS   Recent Results (from the past 240 hour(s))  SARS CORONAVIRUS 2 (TAT 6-24 HRS) Nasopharyngeal Nasopharyngeal Swab     Status: None   Collection Time: 04/27/2019  9:28 PM   Specimen: Nasopharyngeal Swab  Result Value Ref Range Status   SARS Coronavirus 2 NEGATIVE NEGATIVE Final    Comment: (NOTE) SARS-CoV-2 target nucleic acids are NOT DETECTED. The SARS-CoV-2 RNA is generally detectable in upper and lower respiratory specimens during the acute phase of infection. Negative results do not preclude SARS-CoV-2 infection, do not rule out co-infections with other pathogens, and should not be used as the sole basis for treatment or other patient management  decisions. Negative results must be combined with clinical observations, patient history, and epidemiological information. The expected result is Negative. Fact Sheet for Patients: SugarRoll.be Fact Sheet for Healthcare Providers: https://www.woods-mathews.com/ This test is not yet approved or cleared by the [place] FDA and  has been authorized for detection and/or diagnosis of SARS-CoV-2 by FDA under an Emergency Use Authorization (EUA). This EUA will remain  in effect (meaning this test can be used) for the duration of the COVID-19 declaration under Section 56 4(b)(1) of the Act, 21 U.S.C. section 360bbb-3(b)(1), unless the authorization is terminated or revoked sooner. Performed at [facility], [place] [address] [place], [place] [address]   Urine Culture     Status: None   Collection Time: 04/13/19  9:11 PM   Specimen: Urine, Random  Result Value Ref Range Status   Specimen Description   Final    URINE, RANDOM Performed at [facility], [address], [place], [place] [address]    Special Requests   Final    NONE Performed at [facility], [address] [place], [place] [address]    Culture   Final    NO GROWTH Performed at [facility], [place] [address], [place], [place] [address]    Report Status 04/15/2019 FINAL  Final          IMAGING    CT ABDOMEN PELVIS WO CONTRAST  Result Date: 04/19/2019 CLINICAL DATA:  Hypotension  and arrhythmia. Dropping hemoglobin. EXAM: CT ABDOMEN AND PELVIS WITHOUT CONTRAST TECHNIQUE: Multidetector CT imaging of the abdomen and pelvis was performed following the standard protocol without IV contrast. COMPARISON:  One-view chest x-ray 04/19/2019 FINDINGS: Lower chest: Bilateral pleural effusions are present. Associated atelectasis is present. Pacing wires are in place. Heart is mildly enlarged. No significant pericardial effusion is present. Lungs are otherwise  clear. Hepatobiliary: No focal liver abnormality is seen. No gallstones, gallbladder wall thickening, or biliary dilatation. Pancreas: Unremarkable. No pancreatic ductal dilatation or surrounding inflammatory changes. Spleen: The spleen is somewhat atrophic. No focal lesions are present. Adrenals/Urinary Tract: The adrenal glands are normal bilaterally. Kidneys are somewhat heterogeneous with subcentimeter exophytic lesions bilaterally. Hypodense lesion near the lower pole of the left kidney measures up to 1.7 cm. This is consistent with known renal cysts. Stomach/Bowel: The stomach is moderately distended with fluid. Duodenum is unremarkable. Small bowel is within normal limits. Adhesions are suspected anteriorly without obstruction. Fluid is present in a paraumbilical hernia. Appendix is visualized and normal. The ascending and transverse colon is within normal limits. The descending colon is normal. Sigmoid colon demonstrates diverticular changes. Vascular/Lymphatic: Extensive atherosclerotic changes are present without aneurysm. No significant adenopathy is present. Reproductive: Uterus and bilateral adnexa are unremarkable. Other: Minimal fluid is present in the presacral space. No other significant intraperitoneal or retroperitoneal fluid is present. Extensive soft tissue edema is present bilaterally. Musculoskeletal: Multilevel degenerative changes are present. Lumbar spine is fused at L4-5. No focal lytic or blastic lesions are present. Hips are located and within normal limits. IMPRESSION: 1. Bilateral pleural effusions and diffuse soft tissue edema compatible with anasarca. 2. No other acute or focal abnormality to explain the patient's anemia. 3. Sigmoid diverticulosis without diverticulitis. 4. Multilevel degenerative changes in the lumbar spine. 5. Aortic Atherosclerosis (ICD10-I70.0). Electronically Signed   By: San Morelle M.D.   On: 04/19/2019 17:40     Nutrition Status:           Central Line/ continued, requirement due to    ASSESSMENT AND PLAN SYNOPSIS  SHOCK-cardiogenic/Hypovolumic ?GIB with AFib with RVR  Wean off pressors  MAP>55 Start MIDODRINE   ACUTE DIASTOLIC CARDIAC FAILURE-  Supportive care AFIB WITH RVR-switch to oral amiodarone   ESRD ON HD HD as needed/tolerated  CARDIAC ICU monitoring  ID -continue IV abx as prescibed -follow up cultures  GI GI PROPHYLAXIS as indicated  NUTRITIONAL STATUS Nutrition Status:         DIET--> as tolerated Constipation protocol as indicated  ENDO - will use ICU hypoglycemic\Hyperglycemia protocol if indicated   ELECTROLYTES -follow labs as needed -replace as needed -pharmacy consultation and following   GI PRX ordered TRANSFUSIONS AS NEEDED MONITOR FSBS ASSESS the need for LABS as needed   Critical Care Time devoted to patient care services described in this note is 31 minutes.   Overall, patient is critically ill, prognosis is guarded.  Recommend Palliative care consultation   Corrin Parker, M.D.  [facility]  Medical Director [place] Director [facility] [facility]

## 2019-04-20 NOTE — Progress Notes (Signed)
PRE hd

## 2019-04-20 NOTE — Progress Notes (Signed)
Hd started  

## 2019-04-20 NOTE — Progress Notes (Signed)
Family At bedside, clinical status relayed to family  Updated and notified of patients medical condition- Patient with acute aspiration of gastric contents  Progressive multiorgan failure with very low chance of meaningful recovery.  Patient is in dying  Process.  Family understands the situation.  They have consented and agreed to DNR/DNI and would like to proceed with Comfort care measures.   Family are satisfied with Plan of action and management. All questions answered  Corrin Parker, M.D.  [facility]  Medical Director [facility] Director [facility] Cardio-Pulmonary Department

## 2019-04-21 ENCOUNTER — Ambulatory Visit: Payer: Medicare Other | Admitting: Family

## 2019-04-21 LAB — BPAM RBC
Blood Product Expiration Date: 202105182359
Blood Product Expiration Date: 202105182359
Blood Product Expiration Date: 202105182359
ISSUE DATE / TIME: 202104131611
ISSUE DATE / TIME: 202104131806
Unit Type and Rh: 5100
Unit Type and Rh: 5100
Unit Type and Rh: 5100

## 2019-04-21 LAB — TYPE AND SCREEN
ABO/RH(D): O POS
Antibody Screen: NEGATIVE
Unit division: 0
Unit division: 0
Unit division: 0

## 2019-04-21 LAB — PREPARE RBC (CROSSMATCH)

## 2019-04-29 ENCOUNTER — Ambulatory Visit: Payer: Medicare Other | Admitting: Family

## 2019-05-07 NOTE — Progress Notes (Signed)
Pt deceased @ 0105 today and pronounced by 2 RN. Son , NP, and [place] donor notified. See post mortem for details.

## 2019-05-07 NOTE — Progress Notes (Signed)
05-12-2019 0830Contacted Boris Lown, [place] about circumstances of patient hospitalization and death. Explained it was documented as patient sustained a fall, but fall was described as sliding from chair to floor with no fractures or injuries. Erasmo Downer declined it as a Quarry manager case.

## 2019-05-07 NOTE — Death Summary Note (Signed)
DEATH SUMMARY   Patient Details  Name: Michele Beck MRN: [id number] DOB: [birth year]  Admission/Discharge Information   Admit Date:  09-May-2019  Date of Death: Date of Death: (P) 2019-05-20  Time of Death: Time of Death: (P) 0105  Length of Stay: 11  Referring Physician: System, Pcp Not In   Reason(s) for Hospitalization  Fall  Diagnoses  Preliminary cause of death: Aspiration into respiratory tract Secondary Diagnoses (including complications and co-morbidities):  Principal Problem:   ESRD (end stage renal disease) ([place]) Active Problems:   Type 2 diabetes mellitus with stage 4 chronic kidney disease ([place])   Essential hypertension   Pulmonary HTN ([place])   Acute on chronic diastolic heart failure (HCC)   AKI (acute kidney injury) ([place])   UTI (urinary tract infection)   Generalized weakness   Fall at home, initial encounter   Obesity, Class III, BMI 40-49.9 (morbid obesity) ([place])   Acute on chronic diastolic (congestive) heart failure (HCC)   Hyperkalemia   Sinus bradycardia   Acute on chronic anemia secondary to possible gastrointestinal bleeding    Hypovolemic and cardiogenic shock    Atrial Fibrillation with RVR  Brief Hospital Course (including significant findings, care, treatment, and services provided and events leading to death)  Michele Beck is a 79 y.o. year old female with past medical history significant forCKD 4, diastolic CHF (admitted in Jan 2021 for decompensation), pulmonary HTN, DM 2, HTN, gout and obesity. Patient complained ongoing weakness requiring evaluation in the emergency department on 04/02/2019 and due to concern of worsening lower extremity edema, pt referred to the heart failure clinic where she was treated with IV Lasix. She  presented back to the ED on 05/09/19 following a fall at home.  In the ED, UA concerning for UTI, and pt noted to have worsening acute on chronic renal failure with hyperkalemia requiring hospital admission.  She  subsequently required permcath placement for initiation of hemodialysis on 04/14/2019.  On 04/19/2019 pt developed new onset atrial fibrillation with rvr, cardiogenic shock, and hypovolemia shock in the setting of profound anemia concerning for possible acute GI bleed and required transfer to the stepdown unit for pRBC transfusions, amiodarone drip, and neo-synephrine drip.  On 20-May-2019 pt developed worsening acute on chronic hypercapnic hypoxic respiratory failure along with acute encephalopathy requiring continuous Bipap.  However, with Bipap in place patient vomited, and aspirated gastric contents into the lungs resulting in worsening acute respiratory failure.  Pts family notified of significant decline in patients condition, and decided to transition pt to comfort measures only on 04/20/2019.  The patient expired on 05-20-19 at 01:05 am.  Pertinent Labs and Studies  Significant Diagnostic Studies CT ABDOMEN PELVIS WO CONTRAST  Result Date: 04/19/2019 CLINICAL DATA:  Hypotension and arrhythmia. Dropping hemoglobin. EXAM: CT ABDOMEN AND PELVIS WITHOUT CONTRAST TECHNIQUE: Multidetector CT imaging of the abdomen and pelvis was performed following the standard protocol without IV contrast. COMPARISON:  One-view chest x-ray 04/19/2019 FINDINGS: Lower chest: Bilateral pleural effusions are present. Associated atelectasis is present. Pacing wires are in place. Heart is mildly enlarged. No significant pericardial effusion is present. Lungs are otherwise clear. Hepatobiliary: No focal liver abnormality is seen. No gallstones, gallbladder wall thickening, or biliary dilatation. Pancreas: Unremarkable. No pancreatic ductal dilatation or surrounding inflammatory changes. Spleen: The spleen is somewhat atrophic. No focal lesions are present. Adrenals/Urinary Tract: The adrenal glands are normal bilaterally. Kidneys are somewhat heterogeneous with subcentimeter exophytic lesions bilaterally. Hypodense lesion near the  lower pole of the left kidney measures  up to 1.7 cm. This is consistent with known renal cysts. Stomach/Bowel: The stomach is moderately distended with fluid. Duodenum is unremarkable. Small bowel is within normal limits. Adhesions are suspected anteriorly without obstruction. Fluid is present in a paraumbilical hernia. Appendix is visualized and normal. The ascending and transverse colon is within normal limits. The descending colon is normal. Sigmoid colon demonstrates diverticular changes. Vascular/Lymphatic: Extensive atherosclerotic changes are present without aneurysm. No significant adenopathy is present. Reproductive: Uterus and bilateral adnexa are unremarkable. Other: Minimal fluid is present in the presacral space. No other significant intraperitoneal or retroperitoneal fluid is present. Extensive soft tissue edema is present bilaterally. Musculoskeletal: Multilevel degenerative changes are present. Lumbar spine is fused at L4-5. No focal lytic or blastic lesions are present. Hips are located and within normal limits. IMPRESSION: 1. Bilateral pleural effusions and diffuse soft tissue edema compatible with anasarca. 2. No other acute or focal abnormality to explain the patient's anemia. 3. Sigmoid diverticulosis without diverticulitis. 4. Multilevel degenerative changes in the lumbar spine. 5. Aortic Atherosclerosis (ICD10-I70.0). Electronically Signed   By: San Morelle M.D.   On: 04/19/2019 17:40   DG Chest 1 View  Result Date: 04/13/2019 CLINICAL DATA:  Low back pain and bilateral hip pain. EXAM: CHEST  1 VIEW COMPARISON:  02/21/2019 FINDINGS: Stable positioning of single lead cardiac pacemaker. The cardiac silhouette is enlarged. Mediastinal contours appear intact. There is no evidence of focal airspace consolidation, pleural effusion or pneumothorax. Increased interstitial markings with central predominance. Osseous structures are without acute abnormality. Soft tissues are grossly normal.  IMPRESSION: 1. Enlarged cardiac silhouette. 2. Increased interstitial markings with central predominance may represent pulmonary vascular congestion. Electronically Signed   By: Fidela Salisbury M.D.   On: 04/17/2019 17:33   DG Lumbar Spine 2-3 Views  Result Date: 04/16/2019 CLINICAL DATA:  Post fall with low back and bilateral hip pain. Weakness for several days. Fall last night. Lumbosacral back pain, bilateral hip pain, and knee pain. Bilateral lower extremity swelling. EXAM: LUMBAR SPINE - 2-3 VIEW COMPARISON:  None. FINDINGS: Straightening of normal lordosis. No listhesis. Posterior rod and intrapedicular screw fusion at L4-L5, hardware is intact. Diffuse degenerative disc disease with disc space narrowing and endplate spurring. Facet hypertrophy at L2-L3 and L3-L4. Vertebral body heights are preserved. No acute fracture. Sacroiliac joints are congruent. IMPRESSION: 1. No evidence of acute fracture of the lumbar spine. 2. Posterior fusion at L4-L5. Multilevel degenerative disc disease and facet hypertrophy. Electronically Signed   By: Keith Rake M.D.   On: 04/12/2019 17:34   DG Pelvis 1-2 Views  Result Date: 05/03/2019 CLINICAL DATA:  Post fall with low back and bilateral hip pain. Weakness for several days. Fall last night. Lumbosacral back pain, bilateral hip pain, and knee pain. Bilateral lower extremity swelling. EXAM: PELVIS - 1-2 VIEW COMPARISON:  None. FINDINGS: The cortical margins of the bony pelvis are intact. No fracture. Pubic symphysis and sacroiliac joints are congruent. Both femoral heads are well-seated in the respective acetabula. Age related degenerative change of both acetabular spurring. IMPRESSION: Negative for pelvic fracture. Electronically Signed   By: Keith Rake M.D.   On: 04/22/2019 17:35   CT HEAD WO CONTRAST  Result Date: 04/08/2019 CLINICAL DATA:  79 year old female first-time dialysis patient today. Altered mental status. EXAM: CT HEAD WITHOUT CONTRAST  TECHNIQUE: Contiguous axial images were obtained from the base of the skull through the vertex without intravenous contrast. COMPARISON:  None. FINDINGS: Brain: No midline shift, ventriculomegaly, mass effect, evidence of mass  lesion, intracranial hemorrhage or evidence of cortically based acute infarction. Patchy bilateral white matter hypodensity. No cortical encephalomalacia identified. Vascular: Calcified atherosclerosis at the skull base. No suspicious intracranial vascular hyperdensity. Skull: Negative skull. Partially visible degenerative changes in the upper cervical spine. Sinuses/Orbits: Visualized paranasal sinuses and mastoids are well pneumatized. Other: No acute orbit or scalp soft tissue findings. IMPRESSION: 1. No acute intracranial abnormality identified. 2. Bilateral cerebral white matter changes, most commonly due to chronic small vessel disease. Electronically Signed   By: Genevie Ann M.D.   On: 05/01/2019 23:21   PERIPHERAL VASCULAR CATHETERIZATION  Result Date: 04/29/2019 See op note  DG Chest Port 1 View  Result Date: 04/19/2019 CLINICAL DATA:  79 year old female with history of acute respiratory failure. EXAM: PORTABLE CHEST 1 VIEW COMPARISON:  Chest x-ray 04/17/2019. FINDINGS: Right internal jugular PermCath with tip terminating at the superior cavoatrial junction. Left-sided pacemaker/AICD with tip projecting over the expected location of the right ventricle. Lung volumes are low. There is cephalization of the pulmonary vasculature and slight indistinctness of the interstitial markings suggestive of mild pulmonary edema. No definite pleural effusions. Mild cardiomegaly. Upper mediastinal contours are within normal limits. IMPRESSION: 1. The appearance the chest suggests mild congestive heart failure, as above. Electronically Signed   By: Vinnie Langton M.D.   On: 04/19/2019 07:24   ECHOCARDIOGRAM COMPLETE  Result Date: 04/12/2019    ECHOCARDIOGRAM REPORT   Patient Name:   KRISTENA WILHELMI Date of Exam: 04/11/2019 Medical Rec #:  [id number]       Height:       63.0 in Accession #:    [id number]      Weight:       236.0 lb Date of Birth:  December 23, 1940       BSA:          2.075 m Patient Age:    60 years        BP:           107/58 mmHg Patient Gender: F               HR:           50 bpm. Exam Location:  [facility] Procedure: 2D Echo, Cardiac Doppler and Color Doppler Indications:     CHF-Acute Diastolic 915.05 / W97.94  History:         Patient has prior history of Echocardiogram examinations. CHF;                  Risk Factors:Hypertension.  Sonographer:     Alyse Low Roar Referring Phys:  [id number] Athena Masse Diagnosing Phys: Nelva Bush MD IMPRESSIONS  1. Left ventricular ejection fraction, by estimation, is 55 to 60%. The left ventricle has normal function. Left ventricular endocardial border not optimally defined to evaluate regional wall motion. There is mild left ventricular hypertrophy. Left ventricular diastolic parameters are consistent with Grade I diastolic dysfunction (impaired relaxation). There is the interventricular septum is flattened in systole and diastole, consistent with right ventricular pressure and volume overload.  2. Right ventricular systolic function was not well visualized. The right ventricular size is moderately enlarged. There is moderately elevated pulmonary artery systolic pressure. The estimated right ventricular systolic pressure is 74.8 mmHg.  3. Right atrial size was severely dilated.  4. The mitral valve is normal in structure. Trivial mitral valve regurgitation. No evidence of mitral stenosis.  5. Tricuspid valve regurgitation is severe.  6. The aortic valve is tricuspid. Aortic valve regurgitation is  mild to moderate. Mild aortic valve sclerosis is present, with no evidence of aortic valve stenosis.  7. Mildly dilated pulmonary artery.  8. The inferior vena cava is dilated in size with <50% respiratory variability, suggesting right atrial pressure of 15  mmHg. FINDINGS  Left Ventricle: Left ventricular ejection fraction, by estimation, is 55 to 60%. The left ventricle has normal function. Left ventricular endocardial border not optimally defined to evaluate regional wall motion. The left ventricular internal cavity size was normal in size. There is mild left ventricular hypertrophy. The interventricular septum is flattened in systole and diastole, consistent with right ventricular pressure and volume overload. Left ventricular diastolic parameters are consistent with Grade I diastolic dysfunction (impaired relaxation). Right Ventricle: The right ventricular size is moderately enlarged. No increase in right ventricular wall thickness. Right ventricular systolic function was not well visualized. There is moderately elevated pulmonary artery systolic pressure. The tricuspid regurgitant velocity is 3.22 m/s, and with an assumed right atrial pressure of 15 mmHg, the estimated right ventricular systolic pressure is 47.8 mmHg. Left Atrium: Left atrial size was normal in size. Right Atrium: Right atrial size was severely dilated. Pericardium: Trivial pericardial effusion is present. Mitral Valve: The mitral valve is normal in structure. Trivial mitral valve regurgitation. No evidence of mitral valve stenosis. Tricuspid Valve: The tricuspid valve is normal in structure. Tricuspid valve regurgitation is severe. Aortic Valve: The aortic valve is tricuspid. . There is mild thickening and mild calcification of the aortic valve. Aortic valve regurgitation is mild to moderate. Aortic regurgitation PHT measures 495 msec. Mild aortic valve sclerosis is present, with no evidence of aortic valve stenosis. There is mild thickening of the aortic valve. There is mild calcification of the aortic valve. Aortic valve mean gradient measures 4.0 mmHg. Aortic valve peak gradient measures 8.9 mmHg. Aortic valve area, by VTI measures 1.21 cm. Pulmonic Valve: The pulmonic valve was normal in  structure. Pulmonic valve regurgitation is mild to moderate. No evidence of pulmonic stenosis. Aorta: The aortic root is normal in size and structure. Pulmonary Artery: The pulmonary artery is mildly dilated. Venous: The inferior vena cava is dilated in size with less than 50% respiratory variability, suggesting right atrial pressure of 15 mmHg. IAS/Shunts: There is left bowing of the interatrial septum, suggestive of elevated right atrial pressure. The interatrial septum was not well visualized. Additional Comments: A pacer wire is visualized in the right ventricle.  LEFT VENTRICLE PLAX 2D LVIDd:         4.04 cm  Diastology LVIDs:         2.84 cm  LV e' lateral:   7.40 cm/s LV PW:         1.16 cm  LV E/e' lateral: 5.5 LV IVS:        1.19 cm  LV e' medial:    4.79 cm/s LVOT diam:     1.70 cm  LV E/e' medial:  8.5 LV SV:         42 LV SV Index:   20 LVOT Area:     2.27 cm  RIGHT VENTRICLE RV Mid diam:    4.71 cm RV S prime:     12.50 cm/s TAPSE (M-mode): 2.5 cm LEFT ATRIUM             Index       RIGHT ATRIUM           Index LA diam:        4.10 cm 1.98 cm/m  RA  Area:     30.00 cm LA Vol (A2C):   52.7 ml 25.40 ml/m RA Volume:   108.00 ml 52.06 ml/m LA Vol (A4C):   55.1 ml 26.56 ml/m LA Biplane Vol: 56.4 ml 27.19 ml/m  AORTIC VALVE                   PULMONIC VALVE AV Area (Vmax):    1.29 cm    PV Vmax:        1.06 m/s AV Area (Vmean):   1.23 cm    PV Peak grad:   4.5 mmHg AV Area (VTI):     1.21 cm    RVOT Peak grad: 1 mmHg AV Vmax:           149.00 cm/s AV Vmean:          92.700 cm/s AV VTI:            0.348 m AV Peak Grad:      8.9 mmHg AV Mean Grad:      4.0 mmHg LVOT Vmax:         85.00 cm/s LVOT Vmean:        50.300 cm/s LVOT VTI:          0.185 m LVOT/AV VTI ratio: 0.53 AI PHT:            495 msec  AORTA Ao Root diam: 3.00 cm MITRAL VALVE               TRICUSPID VALVE MV Area (PHT): 2.82 cm    TR Peak grad:   41.5 mmHg MV Decel Time: 269 msec    TR Vmax:        322.00 cm/s MV E velocity: 40.70 cm/s MV  A velocity: 76.20 cm/s  SHUNTS MV E/A ratio:  0.53        Systemic VTI:  0.18 m                            Systemic Diam: 1.70 cm Nelva Bush MD Electronically signed by Nelva Bush MD Signature Date/Time: 04/12/2019/7:12:53 AM    Final    DG Femur Min 2 Views Left  Result Date: 04/28/2019 CLINICAL DATA:  Post fall with low back and bilateral hip pain. Weakness for several days. Fall last night. Lumbosacral back pain, bilateral hip pain, and knee pain. Bilateral lower extremity swelling. EXAM: LEFT FEMUR 2 VIEWS COMPARISON:  None. FINDINGS: Cortical margins of the left femur are intact. There is no evidence of fracture or other focal bone lesions. Hip and knee alignment are maintained. Curvilinear density adjacent to the distal medial femoral condyle is consistent with remote MCL injury. Diffuse soft tissue edema of the lower extremities nonspecific. There are vascular calcifications. IMPRESSION: 1. No acute fracture of the left femur. 2. Diffuse soft tissue edema, nonspecific. Electronically Signed   By: Keith Rake M.D.   On: 05/03/2019 17:36   DG Femur Min 2 Views Right  Result Date: 04/07/2019 CLINICAL DATA:  Post fall with low back and bilateral hip pain. Weakness for several days. Fall last night. Lumbosacral back pain, bilateral hip pain, and knee pain. Bilateral lower extremity swelling. EXAM: RIGHT FEMUR 2 VIEWS COMPARISON:  None. FINDINGS: Cortical margins of the right femur are intact. There is no evidence of fracture or other focal bone lesions. Hip and knee alignment are maintained. Moderate osteoarthritis of the knee. Generalized soft tissue edema is nonspecific. There are  vascular calcifications. IMPRESSION: 1. No fracture of the right femur. 2. Nonspecific generalized soft tissue edema. Electronically Signed   By: Keith Rake M.D.   On: 04/25/2019 17:37    Microbiology Recent Results (from the past 240 hour(s))  Urine Culture     Status: None   Collection Time: 04/13/19   9:11 PM   Specimen: Urine, Random  Result Value Ref Range Status   Specimen Description   Final    URINE, RANDOM Performed at [facility], [address] [place], [place] [address]    Special Requests   Final    NONE Performed at [facility], [address], [place], [place] [address]    Culture   Final    NO GROWTH Performed at [facility], [place] [address] [place], [place] [address]    Report Status 04/15/2019 FINAL  Final    Lab Basic Metabolic Panel: Recent Labs  Lab 04/16/19 1019 04/17/19 0521 04/18/19 0412 04/19/19 0416 04/20/19 0337  NA 139 140 141 141 143  K 4.2 4.0 4.0 4.5 4.2  CL 99 100 101 102 103  CO2 29 31 30 28  32  GLUCOSE 115* 102* 107* 167* 182*  BUN 34* 24* 36* 48* 82*  CREATININE 2.74* 2.02* 2.27* 1.71* 2.23*  CALCIUM 8.5* 8.5* 9.0 8.1* 8.0*  MG  --   --   --  1.6* 2.0  PHOS 3.2  --   --   --  2.3*   Liver Function Tests: Recent Labs  Lab 04/16/19 1019 04/19/19 0416  AST  --  22  ALT  --  14  ALKPHOS  --  48  BILITOT  --  0.9  PROT  --  4.5*  ALBUMIN 3.0* 2.7*   No results for input(s): LIPASE, AMYLASE in the last 168 hours. No results for input(s): AMMONIA in the last 168 hours. CBC: Recent Labs  Lab 04/16/19 1019 04/16/19 1019 04/17/19 0521 04/17/19 0521 04/18/19 0412 04/18/19 0412 04/19/19 0948 04/19/19 2130 04/20/19 0337 04/20/19 0939 04/20/19 1425  WBC 8.4  --  9.7  --  8.9  --  15.2*  --  18.8*  --   --   NEUTROABS  --   --   --   --   --   --  12.3*  --  15.4*  --   --   HGB 9.7*   < > 9.7*   < > 9.0*   < > 4.5* 7.4* 6.5* 6.3* 6.8*  HCT 29.7*   < > 29.4*   < > 28.5*   < > 14.4* 22.3* 19.8* 19.3* 19.9*  MCV 92.5  --  91.6  --  94.7  --  97.3  --  92.5  --   --   PLT 179  --  184  --  170  --  111*  --  110*  --   --    < > = values in this interval not displayed.   Cardiac Enzymes: No results for input(s): CKTOTAL, CKMB, CKMBINDEX, TROPONINI in the last 168 hours. Sepsis Labs: Recent Labs   Lab 04/17/19 0521 04/18/19 0412 04/19/19 0844 04/19/19 0948 04/20/19 0337  PROCALCITON  --   --  0.43  --  0.74  WBC 9.7 8.9  --  15.2* 18.8*    Procedures/Operations  Hemodialysis  Placement of right tunneled hemodialysis catheter via the right internal jugular Right femoral central line placement  Marda Stalker, [place] Pager  [phone] (please enter 7 [phone]) PCCM Consult Pager [phone] (please enter 7 digits)

## 2019-05-07 DEATH — deceased

## 2020-07-31 IMAGING — CT CT HEAD W/O CM
3 series · 15 of 45 positions shown, 18 images · non-contrast
Comparison: None.

CLINICAL DATA: 78-year-old female first-time dialysis patient
today. Altered mental status.

EXAM:
CT HEAD WITHOUT CONTRAST
TECHNIQUE: Contiguous axial images were obtained from the base of the skull
through the vertex without intravenous contrast.

[Series 3: head wo · axial · 0.42mm/px · z∈[+211,+326]mm · 9 of 28 slices shown, 12 images]
[im 3/28  brain]
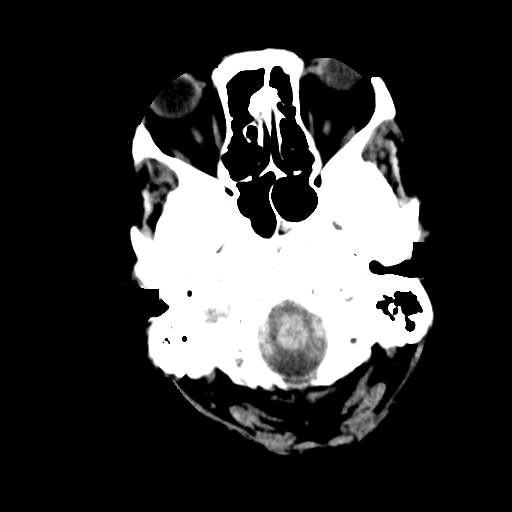
[im 3/28  bone]
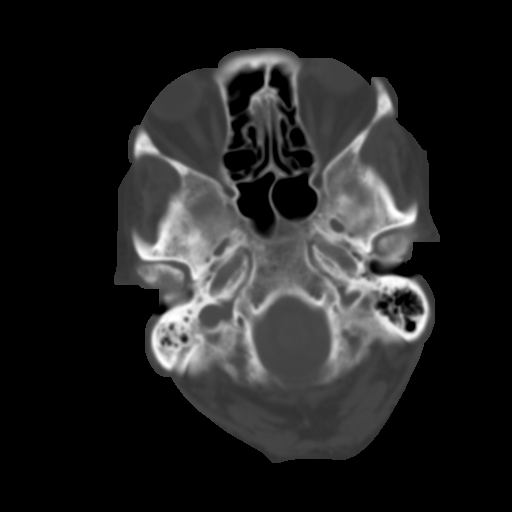
[im 6/28  brain]
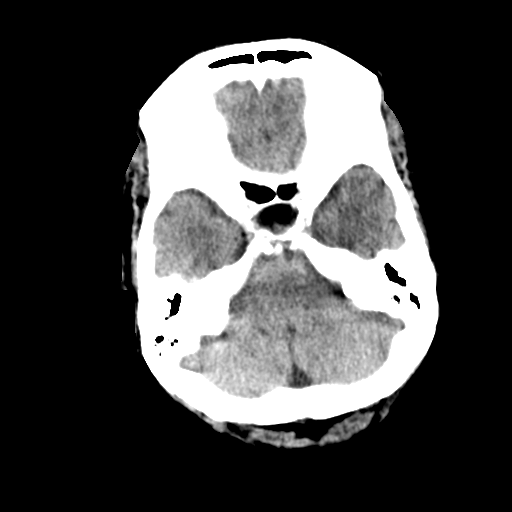
[im 9/28  brain]
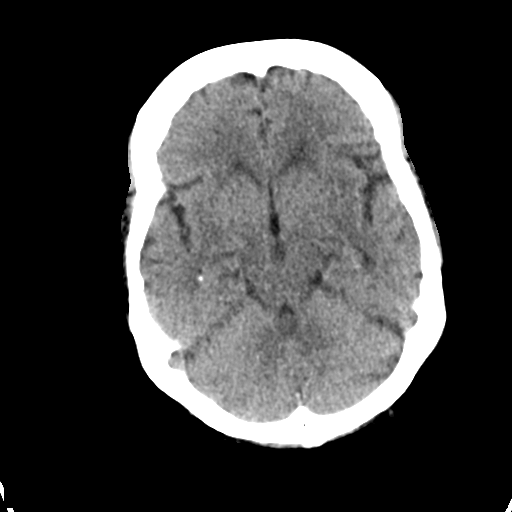
[im 12/28  brain]
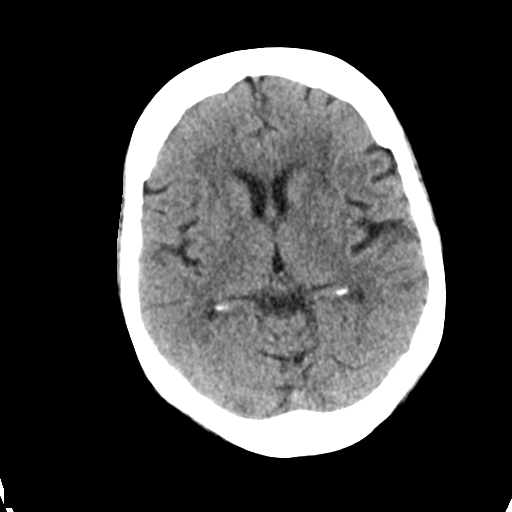
[im 15/28  brain]
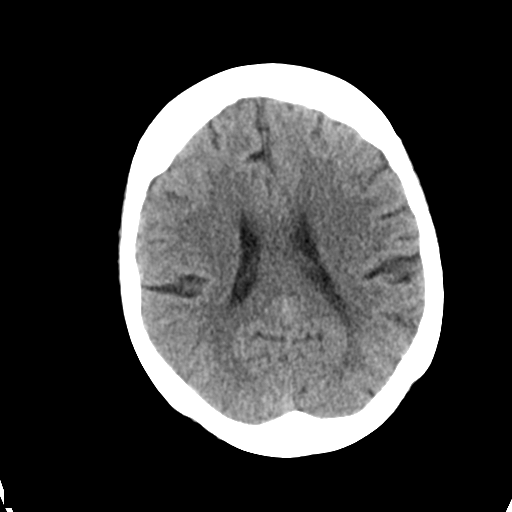
[im 15/28  bone]
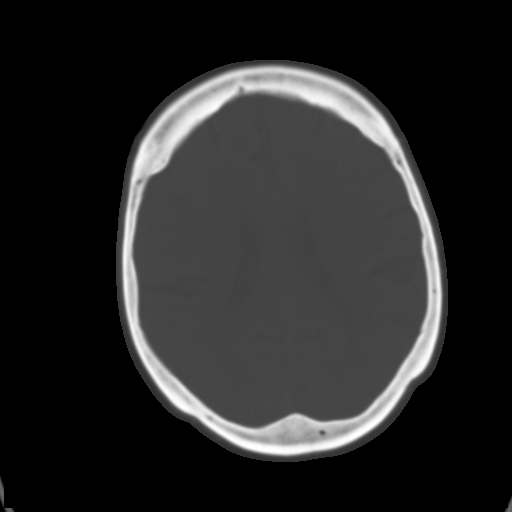
[im 17/28  brain]
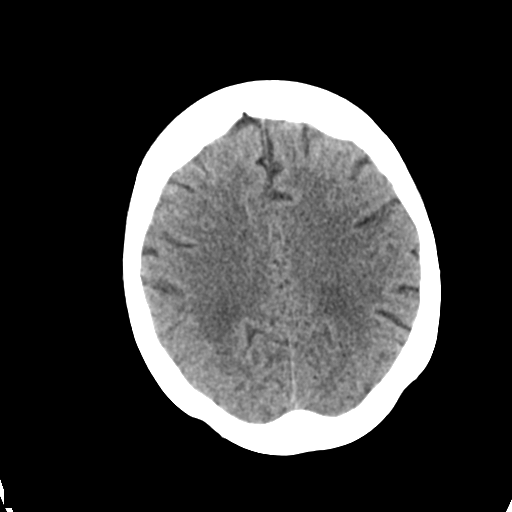
[im 20/28  brain]
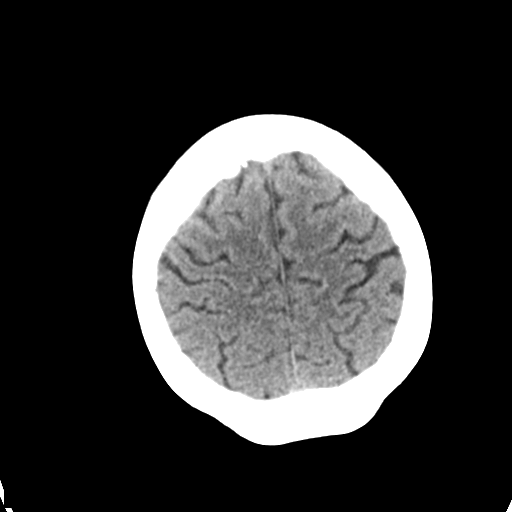
[im 23/28  brain]
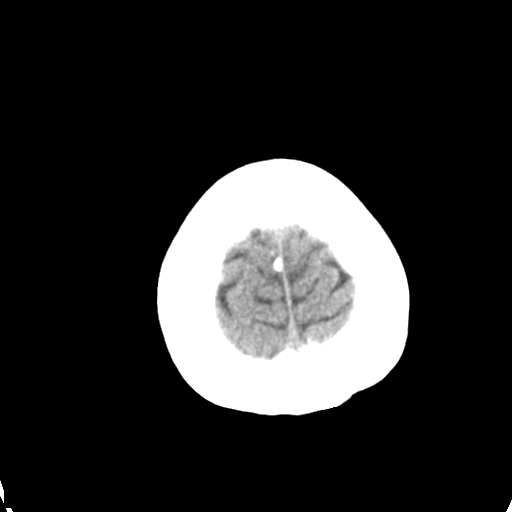
[im 26/28  brain]
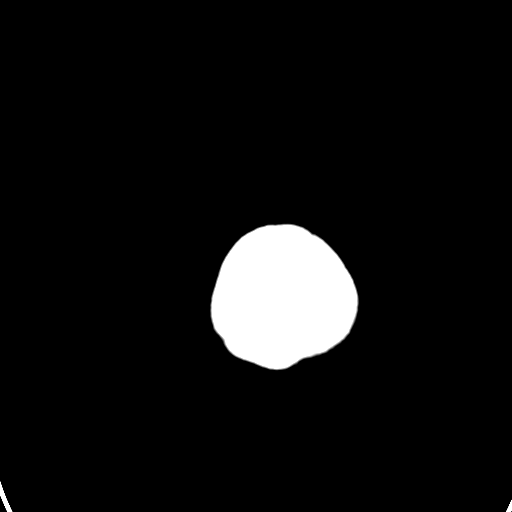
[im 26/28  bone]
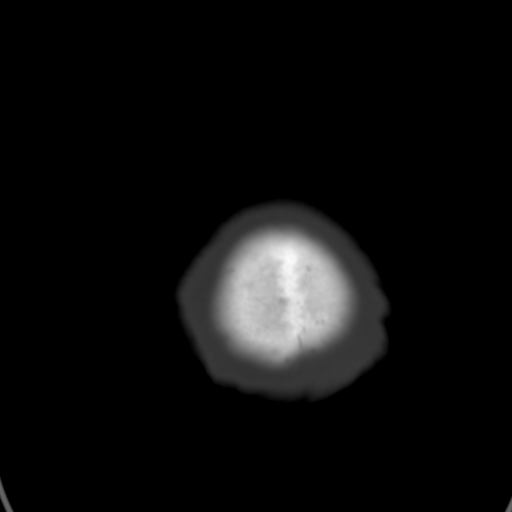

[Series 4: coronal soft tissue · coronal · 0.28mm/px · 3 of 59 slices shown]
[im 20/59  brain]
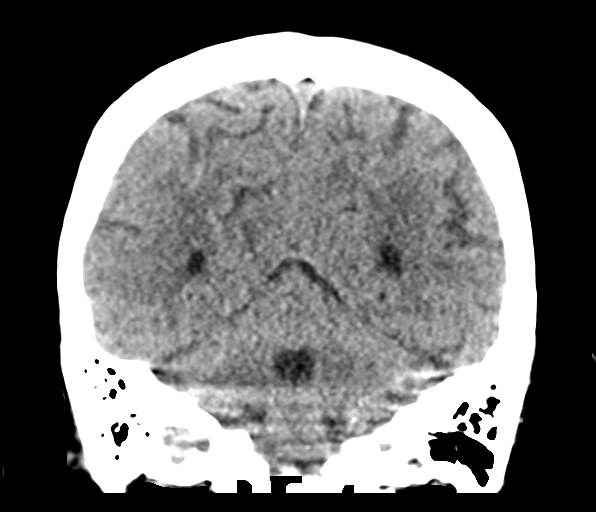
[im 26/59  brain]
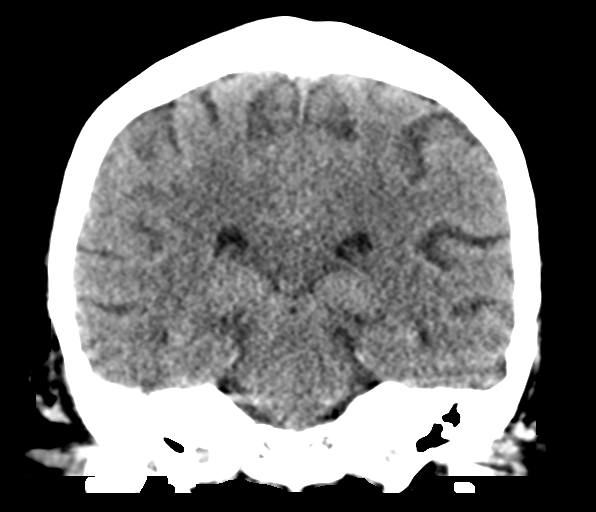
[im 33/59  brain]
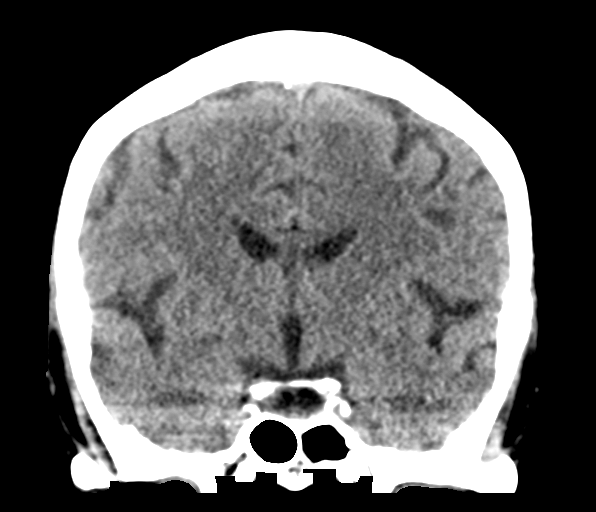

[Series 5: sagittal soft tissue · sagittal · 0.29mm/px · 3 of 48 slices shown]
[im 16/48  brain]
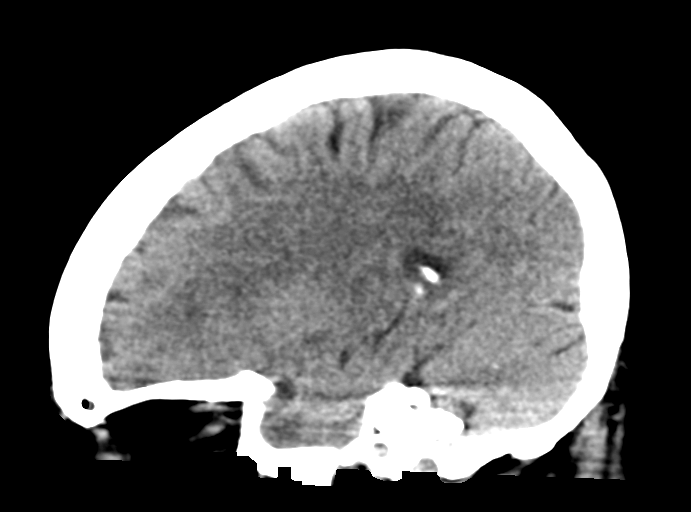
[im 24/48  brain]
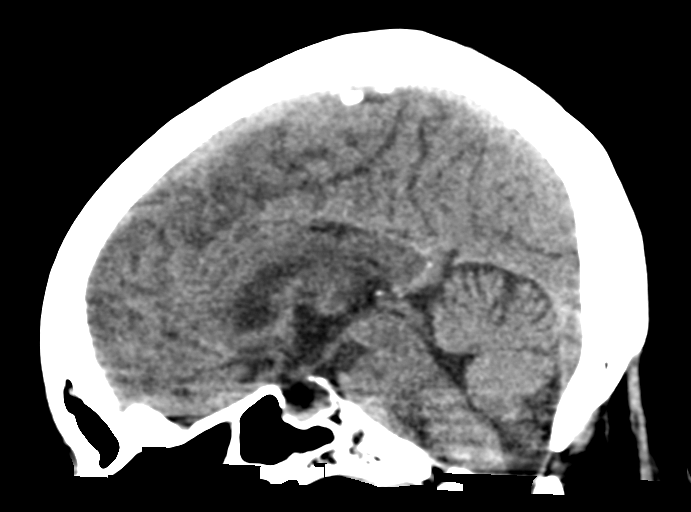
[im 32/48  brain]
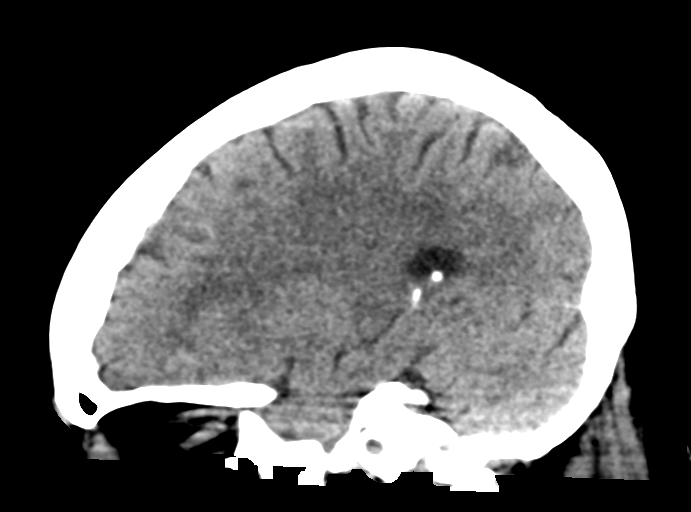

[15 of 45 positions shown; findings below may reference images not displayed]

FINDINGS: Brain: No midline shift, ventriculomegaly, mass effect, evidence of
mass lesion, intracranial hemorrhage or evidence of cortically based
acute infarction. Patchy bilateral white matter hypodensity. No
cortical encephalomalacia identified.

Vascular: Calcified atherosclerosis at the skull base. No suspicious
intracranial vascular hyperdensity.

Skull: Negative skull. Partially visible degenerative changes in the
upper cervical spine.

Sinuses/Orbits: Visualized paranasal sinuses and mastoids are well
pneumatized.

Other: No acute orbit or scalp soft tissue findings.
IMPRESSION: 1. No acute intracranial abnormality identified.
2. Bilateral cerebral white matter changes, most commonly due to
chronic small vessel disease.

## 2020-08-05 IMAGING — DX DG CHEST 1V PORT
1 series · 1 of 1 positions shown · non-contrast
Comparison: Chest x-ray 04/10/2019.

CLINICAL DATA: 78-year-old female with history of acute respiratory
failure.

EXAM:
PORTABLE CHEST 1 VIEW

[chest ap]
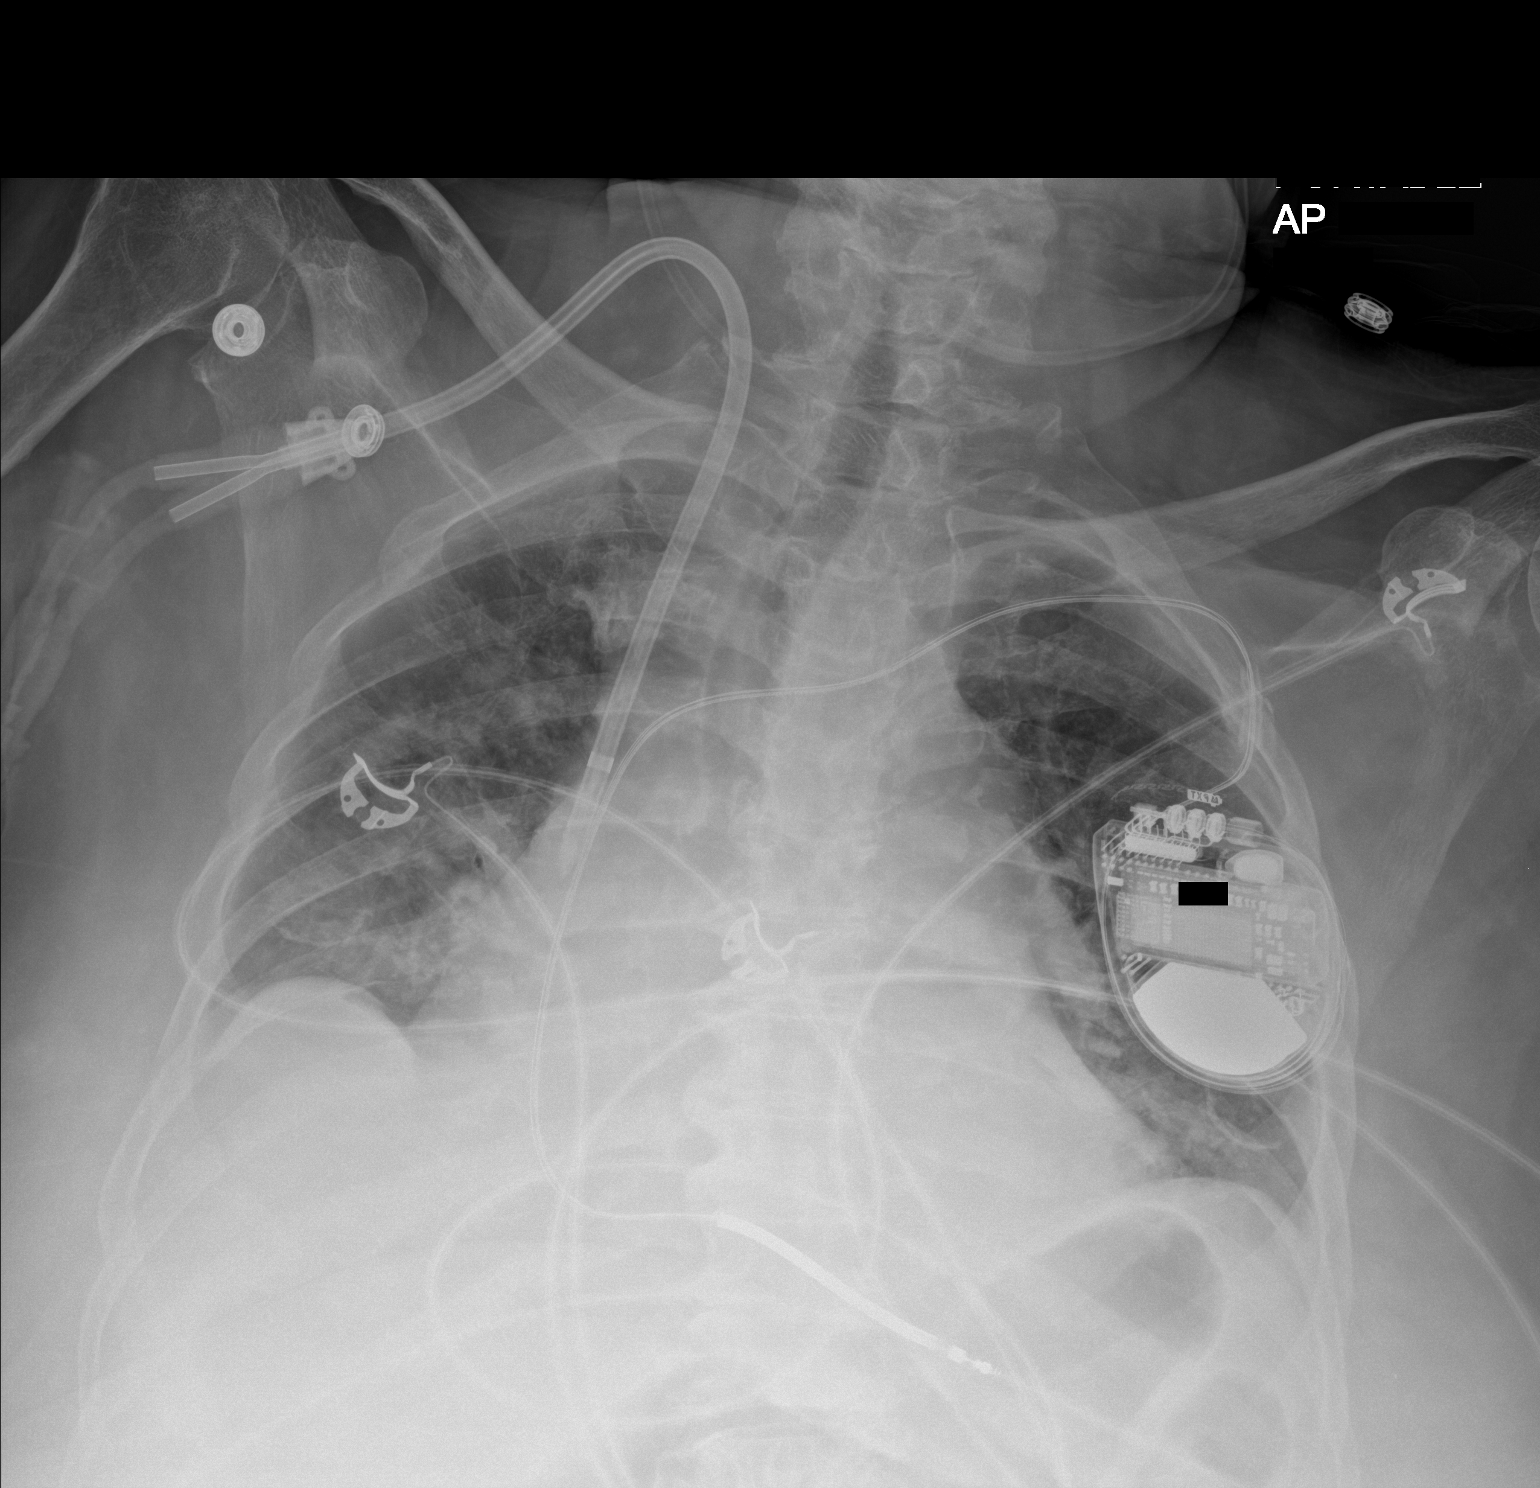

[1 of 1 positions shown; findings below may reference images not displayed]

FINDINGS: Right internal jugular PermCath with tip terminating at the superior
cavoatrial junction. Left-sided pacemaker/AICD with tip projecting
over the expected location of the right ventricle. Lung volumes are
low. There is cephalization of the pulmonary vasculature and slight
indistinctness of the interstitial markings suggestive of mild
pulmonary edema. No definite pleural effusions. Mild cardiomegaly.
Upper mediastinal contours are within normal limits.
IMPRESSION: 1. The appearance the chest suggests mild congestive heart failure,
as above.
